# Patient Record
Sex: Male | Born: 1950 | Race: Black or African American | Hispanic: No | Marital: Married | Smoking: Former smoker
Health system: Southern US, Community
[De-identification: ages and names within clinical notes are randomized; demographics above are authoritative.]

## PROBLEM LIST (undated history)

## (undated) DIAGNOSIS — E785 Hyperlipidemia, unspecified: Secondary | ICD-10-CM

## (undated) DIAGNOSIS — M1990 Unspecified osteoarthritis, unspecified site: Secondary | ICD-10-CM

## (undated) DIAGNOSIS — D649 Anemia, unspecified: Secondary | ICD-10-CM

## (undated) DIAGNOSIS — H269 Unspecified cataract: Secondary | ICD-10-CM

## (undated) DIAGNOSIS — M5410 Radiculopathy, site unspecified: Secondary | ICD-10-CM

## (undated) DIAGNOSIS — E119 Type 2 diabetes mellitus without complications: Secondary | ICD-10-CM

## (undated) DIAGNOSIS — I10 Essential (primary) hypertension: Secondary | ICD-10-CM

## (undated) DIAGNOSIS — C8190 Hodgkin lymphoma, unspecified, unspecified site: Secondary | ICD-10-CM

## (undated) DIAGNOSIS — I639 Cerebral infarction, unspecified: Secondary | ICD-10-CM

## (undated) DIAGNOSIS — J189 Pneumonia, unspecified organism: Secondary | ICD-10-CM

## (undated) DIAGNOSIS — Z8719 Personal history of other diseases of the digestive system: Secondary | ICD-10-CM

## (undated) DIAGNOSIS — I519 Heart disease, unspecified: Secondary | ICD-10-CM

## (undated) DIAGNOSIS — R0981 Nasal congestion: Secondary | ICD-10-CM

## (undated) DIAGNOSIS — J449 Chronic obstructive pulmonary disease, unspecified: Secondary | ICD-10-CM

## (undated) DIAGNOSIS — G4730 Sleep apnea, unspecified: Secondary | ICD-10-CM

## (undated) HISTORY — DX: Essential (primary) hypertension: I10

## (undated) HISTORY — DX: Unspecified cataract: H26.9

## (undated) HISTORY — DX: Anemia, unspecified: D64.9

## (undated) HISTORY — DX: Hodgkin lymphoma, unspecified, unspecified site: C81.90

## (undated) HISTORY — DX: Heart disease, unspecified: I51.9

## (undated) HISTORY — DX: Pneumonia, unspecified organism: J18.9

## (undated) HISTORY — DX: Sleep apnea, unspecified: G47.30

## (undated) HISTORY — DX: Cerebral infarction, unspecified: I63.9

## (undated) HISTORY — DX: Hyperlipidemia, unspecified: E78.5

## (undated) HISTORY — DX: Radiculopathy, site unspecified: M54.10

## (undated) HISTORY — DX: Type 2 diabetes mellitus without complications: E11.9

## (undated) HISTORY — DX: Chronic obstructive pulmonary disease, unspecified: J44.9

---

## 1998-06-03 DIAGNOSIS — C8190 Hodgkin lymphoma, unspecified, unspecified site: Secondary | ICD-10-CM

## 1998-06-03 HISTORY — DX: Hodgkin lymphoma, unspecified, unspecified site: C81.90

## 1998-06-03 HISTORY — PX: OTHER SURGICAL HISTORY: SHX169

## 1998-10-11 ENCOUNTER — Other Ambulatory Visit: Admission: RE | Admit: 1998-10-11 | Discharge: 1998-10-11 | Payer: Self-pay | Admitting: Oncology

## 1998-11-06 ENCOUNTER — Ambulatory Visit (HOSPITAL_COMMUNITY): Admission: RE | Admit: 1998-11-06 | Discharge: 1998-11-06 | Payer: Self-pay | Admitting: Oncology

## 1998-11-06 ENCOUNTER — Encounter: Payer: Self-pay | Admitting: Oncology

## 1998-11-27 ENCOUNTER — Ambulatory Visit (HOSPITAL_COMMUNITY): Admission: RE | Admit: 1998-11-27 | Discharge: 1998-11-27 | Payer: Self-pay | Admitting: Oncology

## 1999-01-16 ENCOUNTER — Ambulatory Visit (HOSPITAL_COMMUNITY): Admission: RE | Admit: 1999-01-16 | Discharge: 1999-01-16 | Payer: Self-pay | Admitting: Oncology

## 1999-02-16 ENCOUNTER — Other Ambulatory Visit: Admission: RE | Admit: 1999-02-16 | Discharge: 1999-02-16 | Payer: Self-pay | Admitting: Oncology

## 2000-09-01 HISTORY — PX: OTHER SURGICAL HISTORY: SHX169

## 2000-09-04 ENCOUNTER — Encounter: Payer: Self-pay | Admitting: Internal Medicine

## 2000-11-18 ENCOUNTER — Encounter: Payer: Self-pay | Admitting: Internal Medicine

## 2000-12-18 ENCOUNTER — Encounter: Payer: Self-pay | Admitting: Internal Medicine

## 2001-06-03 HISTORY — PX: COLONOSCOPY: SHX174

## 2001-09-29 ENCOUNTER — Encounter: Payer: Self-pay | Admitting: Internal Medicine

## 2002-03-25 ENCOUNTER — Encounter: Payer: Self-pay | Admitting: Internal Medicine

## 2002-08-17 ENCOUNTER — Encounter: Payer: Self-pay | Admitting: Internal Medicine

## 2002-09-04 ENCOUNTER — Inpatient Hospital Stay (HOSPITAL_COMMUNITY): Admission: EM | Admit: 2002-09-04 | Discharge: 2002-09-08 | Payer: Self-pay | Admitting: Emergency Medicine

## 2002-09-04 ENCOUNTER — Encounter: Payer: Self-pay | Admitting: Emergency Medicine

## 2002-09-05 ENCOUNTER — Encounter: Payer: Self-pay | Admitting: Neurology

## 2002-09-06 ENCOUNTER — Encounter: Payer: Self-pay | Admitting: Cardiology

## 2002-09-07 ENCOUNTER — Encounter: Payer: Self-pay | Admitting: Internal Medicine

## 2002-09-13 ENCOUNTER — Encounter: Payer: Self-pay | Admitting: Internal Medicine

## 2002-09-13 ENCOUNTER — Ambulatory Visit (HOSPITAL_COMMUNITY): Admission: RE | Admit: 2002-09-13 | Discharge: 2002-09-13 | Payer: Self-pay | Admitting: Internal Medicine

## 2002-09-28 ENCOUNTER — Encounter: Payer: Self-pay | Admitting: Internal Medicine

## 2002-09-28 ENCOUNTER — Encounter: Admission: RE | Admit: 2002-09-28 | Discharge: 2002-09-28 | Payer: Self-pay | Admitting: Internal Medicine

## 2002-11-19 ENCOUNTER — Encounter: Payer: Self-pay | Admitting: Orthopedic Surgery

## 2002-11-19 ENCOUNTER — Ambulatory Visit (HOSPITAL_COMMUNITY): Admission: RE | Admit: 2002-11-19 | Discharge: 2002-11-19 | Payer: Self-pay | Admitting: Orthopedic Surgery

## 2002-12-29 ENCOUNTER — Encounter: Payer: Self-pay | Admitting: Orthopedic Surgery

## 2002-12-29 ENCOUNTER — Encounter: Admission: RE | Admit: 2002-12-29 | Discharge: 2002-12-29 | Payer: Self-pay | Admitting: Orthopedic Surgery

## 2003-06-04 DIAGNOSIS — I639 Cerebral infarction, unspecified: Secondary | ICD-10-CM

## 2003-06-04 HISTORY — DX: Cerebral infarction, unspecified: I63.9

## 2003-06-24 ENCOUNTER — Inpatient Hospital Stay (HOSPITAL_COMMUNITY): Admission: EM | Admit: 2003-06-24 | Discharge: 2003-06-29 | Payer: Self-pay | Admitting: Emergency Medicine

## 2003-06-29 ENCOUNTER — Encounter: Payer: Self-pay | Admitting: Internal Medicine

## 2004-06-21 ENCOUNTER — Ambulatory Visit: Payer: Self-pay | Admitting: Internal Medicine

## 2004-09-21 ENCOUNTER — Ambulatory Visit: Payer: Self-pay | Admitting: Oncology

## 2004-10-19 ENCOUNTER — Ambulatory Visit: Payer: Self-pay | Admitting: Internal Medicine

## 2005-01-16 ENCOUNTER — Ambulatory Visit: Payer: Self-pay | Admitting: Internal Medicine

## 2005-04-17 ENCOUNTER — Ambulatory Visit: Payer: Self-pay | Admitting: Internal Medicine

## 2005-07-11 ENCOUNTER — Ambulatory Visit: Payer: Self-pay | Admitting: Internal Medicine

## 2005-07-18 ENCOUNTER — Ambulatory Visit: Payer: Self-pay | Admitting: Internal Medicine

## 2005-09-20 ENCOUNTER — Ambulatory Visit: Payer: Self-pay | Admitting: Oncology

## 2005-09-23 LAB — CBC WITH DIFFERENTIAL/PLATELET
BASO%: 0.6 % (ref 0.0–2.0)
EOS%: 2.6 % (ref 0.0–7.0)
HGB: 14.1 g/dL (ref 13.0–17.1)
LYMPH%: 23.7 % (ref 14.0–48.0)
MCH: 33.2 pg (ref 28.0–33.4)
MCHC: 33.8 g/dL (ref 32.0–35.9)
MONO%: 8.3 % (ref 0.0–13.0)
NEUT#: 2.5 10*3/uL (ref 1.5–6.5)
NEUT%: 64.8 % (ref 40.0–75.0)
WBC: 3.8 10*3/uL — ABNORMAL LOW (ref 4.0–10.0)

## 2005-10-08 ENCOUNTER — Ambulatory Visit: Payer: Self-pay | Admitting: Internal Medicine

## 2005-10-15 ENCOUNTER — Ambulatory Visit: Payer: Self-pay | Admitting: Internal Medicine

## 2006-01-16 ENCOUNTER — Ambulatory Visit: Payer: Self-pay | Admitting: Internal Medicine

## 2006-04-17 ENCOUNTER — Ambulatory Visit: Payer: Self-pay | Admitting: Internal Medicine

## 2006-05-01 ENCOUNTER — Ambulatory Visit: Payer: Self-pay | Admitting: Internal Medicine

## 2006-05-01 LAB — CONVERTED CEMR LAB
Alkaline Phosphatase: 78 units/L (ref 39–117)
BUN: 11 mg/dL (ref 6–23)
Calcium: 9.6 mg/dL (ref 8.4–10.5)
Chloride: 101 meq/L (ref 96–112)
GFR calc non Af Amer: 66 mL/min
Glomerular Filtration Rate, Af Am: 80 mL/min/{1.73_m2}
Hemoglobin: 14.7 g/dL (ref 13.0–17.0)
Monocytes Absolute: 0.5 10*3/uL (ref 0.2–0.7)
Monocytes Relative: 12.1 % — ABNORMAL HIGH (ref 3.0–11.0)
Neutro Abs: 2.2 10*3/uL (ref 1.4–7.7)
RBC: 4.49 M/uL (ref 4.22–5.81)
RDW: 11.9 % (ref 11.5–14.6)
WBC: 4.3 10*3/uL — ABNORMAL LOW (ref 4.5–10.5)

## 2006-07-04 DIAGNOSIS — J189 Pneumonia, unspecified organism: Secondary | ICD-10-CM

## 2006-07-04 HISTORY — DX: Pneumonia, unspecified organism: J18.9

## 2006-07-15 ENCOUNTER — Ambulatory Visit: Payer: Self-pay | Admitting: Internal Medicine

## 2006-07-15 ENCOUNTER — Inpatient Hospital Stay (HOSPITAL_COMMUNITY): Admission: EM | Admit: 2006-07-15 | Discharge: 2006-07-26 | Payer: Self-pay | Admitting: Emergency Medicine

## 2006-08-04 ENCOUNTER — Ambulatory Visit: Payer: Self-pay | Admitting: Internal Medicine

## 2006-09-17 ENCOUNTER — Ambulatory Visit: Payer: Self-pay | Admitting: Oncology

## 2006-09-17 ENCOUNTER — Ambulatory Visit: Payer: Self-pay | Admitting: Internal Medicine

## 2006-09-22 LAB — CBC WITH DIFFERENTIAL/PLATELET
Basophils Absolute: 0 10*3/uL (ref 0.0–0.1)
EOS%: 7 % (ref 0.0–7.0)
MCH: 31.5 pg (ref 28.0–33.4)
MCV: 90.7 fL (ref 81.6–98.0)
MONO%: 11.3 % (ref 0.0–13.0)
RBC: 4.49 10*6/uL (ref 4.20–5.71)
RDW: 13 % (ref 11.2–14.6)

## 2006-09-22 LAB — ERYTHROCYTE SEDIMENTATION RATE: Sed Rate: 5 mm/hr (ref 0–20)

## 2006-10-13 LAB — CBC WITH DIFFERENTIAL/PLATELET
BASO%: 0.5 % (ref 0.0–2.0)
Basophils Absolute: 0 10*3/uL (ref 0.0–0.1)
EOS%: 3.4 % (ref 0.0–7.0)
HCT: 39 % (ref 38.7–49.9)
HGB: 13.5 g/dL (ref 13.0–17.1)
MCH: 31.2 pg (ref 28.0–33.4)
MCHC: 34.5 g/dL (ref 32.0–35.9)
MCV: 90.4 fL (ref 81.6–98.0)
MONO%: 11.9 % (ref 0.0–13.0)
NEUT%: 59.2 % (ref 40.0–75.0)
RDW: 12.8 % (ref 11.2–14.6)
lymph#: 1.2 10*3/uL (ref 0.9–3.3)

## 2006-11-04 ENCOUNTER — Ambulatory Visit: Payer: Self-pay | Admitting: Internal Medicine

## 2006-11-04 LAB — CONVERTED CEMR LAB: Hgb A1c MFr Bld: 7.7 % — ABNORMAL HIGH (ref 4.6–6.0)

## 2006-11-05 ENCOUNTER — Ambulatory Visit: Payer: Self-pay | Admitting: Oncology

## 2006-11-05 ENCOUNTER — Encounter: Payer: Self-pay | Admitting: Internal Medicine

## 2006-11-06 ENCOUNTER — Encounter: Payer: Self-pay | Admitting: Internal Medicine

## 2006-11-06 DIAGNOSIS — I10 Essential (primary) hypertension: Secondary | ICD-10-CM | POA: Insufficient documentation

## 2006-11-06 DIAGNOSIS — Z8679 Personal history of other diseases of the circulatory system: Secondary | ICD-10-CM | POA: Insufficient documentation

## 2006-11-06 DIAGNOSIS — I509 Heart failure, unspecified: Secondary | ICD-10-CM

## 2006-11-06 DIAGNOSIS — E785 Hyperlipidemia, unspecified: Secondary | ICD-10-CM | POA: Insufficient documentation

## 2006-11-06 DIAGNOSIS — C8190 Hodgkin lymphoma, unspecified, unspecified site: Secondary | ICD-10-CM | POA: Insufficient documentation

## 2006-11-06 DIAGNOSIS — E1159 Type 2 diabetes mellitus with other circulatory complications: Secondary | ICD-10-CM | POA: Insufficient documentation

## 2007-01-19 ENCOUNTER — Encounter (INDEPENDENT_AMBULATORY_CARE_PROVIDER_SITE_OTHER): Payer: Self-pay

## 2007-01-30 ENCOUNTER — Ambulatory Visit: Payer: Self-pay | Admitting: Oncology

## 2007-02-10 ENCOUNTER — Ambulatory Visit: Payer: Self-pay | Admitting: Internal Medicine

## 2007-02-10 LAB — CONVERTED CEMR LAB: Hgb A1c MFr Bld: 8 % — ABNORMAL HIGH (ref 4.6–6.0)

## 2007-02-18 ENCOUNTER — Telehealth: Payer: Self-pay | Admitting: Internal Medicine

## 2007-02-24 ENCOUNTER — Ambulatory Visit: Payer: Self-pay | Admitting: Internal Medicine

## 2007-02-24 DIAGNOSIS — J069 Acute upper respiratory infection, unspecified: Secondary | ICD-10-CM

## 2007-03-11 ENCOUNTER — Telehealth: Payer: Self-pay | Admitting: Internal Medicine

## 2007-03-16 ENCOUNTER — Ambulatory Visit: Payer: Self-pay | Admitting: Oncology

## 2007-05-12 ENCOUNTER — Encounter: Payer: Self-pay | Admitting: Internal Medicine

## 2007-05-12 ENCOUNTER — Ambulatory Visit: Payer: Self-pay | Admitting: Oncology

## 2007-05-14 ENCOUNTER — Ambulatory Visit: Payer: Self-pay | Admitting: Internal Medicine

## 2007-05-18 LAB — CONVERTED CEMR LAB: Hgb A1c MFr Bld: 8.1 % — ABNORMAL HIGH (ref 4.6–6.0)

## 2007-05-25 ENCOUNTER — Telehealth: Payer: Self-pay | Admitting: Internal Medicine

## 2007-06-22 ENCOUNTER — Ambulatory Visit: Payer: Self-pay | Admitting: Internal Medicine

## 2007-07-17 ENCOUNTER — Ambulatory Visit: Payer: Self-pay | Admitting: Oncology

## 2007-07-21 ENCOUNTER — Encounter: Payer: Self-pay | Admitting: Internal Medicine

## 2007-09-17 ENCOUNTER — Encounter: Payer: Self-pay | Admitting: Internal Medicine

## 2007-09-18 ENCOUNTER — Ambulatory Visit: Payer: Self-pay | Admitting: Internal Medicine

## 2007-09-21 LAB — CONVERTED CEMR LAB: Hgb A1c MFr Bld: 8.3 % — ABNORMAL HIGH (ref 4.6–6.0)

## 2007-10-20 ENCOUNTER — Telehealth: Payer: Self-pay | Admitting: Internal Medicine

## 2007-12-14 ENCOUNTER — Encounter: Payer: Self-pay | Admitting: Internal Medicine

## 2007-12-18 ENCOUNTER — Ambulatory Visit: Payer: Self-pay | Admitting: Internal Medicine

## 2007-12-21 LAB — CONVERTED CEMR LAB: Hgb A1c MFr Bld: 8.4 % — ABNORMAL HIGH (ref 4.6–6.0)

## 2008-01-14 ENCOUNTER — Ambulatory Visit: Payer: Self-pay | Admitting: Oncology

## 2008-02-19 ENCOUNTER — Encounter: Payer: Self-pay | Admitting: Internal Medicine

## 2008-03-18 ENCOUNTER — Ambulatory Visit: Payer: Self-pay | Admitting: Internal Medicine

## 2008-03-21 ENCOUNTER — Telehealth (INDEPENDENT_AMBULATORY_CARE_PROVIDER_SITE_OTHER): Payer: Self-pay

## 2008-04-13 ENCOUNTER — Telehealth: Payer: Self-pay | Admitting: Internal Medicine

## 2008-06-14 ENCOUNTER — Ambulatory Visit: Payer: Self-pay | Admitting: Internal Medicine

## 2008-06-14 LAB — CONVERTED CEMR LAB
Alkaline Phosphatase: 64 units/L (ref 39–117)
BUN: 13 mg/dL (ref 6–23)
Bilirubin Urine: NEGATIVE
Bilirubin, Direct: 0.1 mg/dL (ref 0.0–0.3)
Blood in Urine, dipstick: NEGATIVE
Chloride: 108 meq/L (ref 96–112)
Cholesterol: 132 mg/dL (ref 0–200)
Creatinine, Ser: 0.9 mg/dL (ref 0.4–1.5)
Creatinine,U: 74.3 mg/dL
Eosinophils Relative: 2 % (ref 0.0–5.0)
Glucose, Bld: 179 mg/dL — ABNORMAL HIGH (ref 70–99)
HDL: 40.4 mg/dL (ref >39.0)
Hgb A1c MFr Bld: 9 % — ABNORMAL HIGH (ref 4.6–6.0)
MCHC: 33.3 g/dL (ref 30.0–36.0)
Microalb Creat Ratio: 296.1 mg/g — ABNORMAL HIGH (ref 0.0–30.0)
RDW: 11.9 % (ref 11.5–14.6)
Sodium: 146 meq/L — ABNORMAL HIGH (ref 135–145)
Specific Gravity, Urine: 1.015
TSH: 1.23 microintl units/mL (ref 0.35–5.50)
Total Protein: 6.7 g/dL (ref 6.0–8.3)
Urobilinogen, UA: 1
VLDL: 19 mg/dL (ref 0–40)
WBC Urine, dipstick: NEGATIVE
pH: 5.5

## 2008-06-21 ENCOUNTER — Ambulatory Visit: Payer: Self-pay | Admitting: Internal Medicine

## 2008-07-22 ENCOUNTER — Ambulatory Visit: Payer: Self-pay | Admitting: Internal Medicine

## 2008-10-14 ENCOUNTER — Ambulatory Visit: Payer: Self-pay | Admitting: Oncology

## 2008-10-18 ENCOUNTER — Encounter: Payer: Self-pay | Admitting: Internal Medicine

## 2008-10-20 ENCOUNTER — Ambulatory Visit: Payer: Self-pay | Admitting: Internal Medicine

## 2008-10-21 LAB — CONVERTED CEMR LAB: Hgb A1c MFr Bld: 7.5 % — ABNORMAL HIGH (ref 4.6–6.5)

## 2008-12-22 ENCOUNTER — Telehealth: Payer: Self-pay | Admitting: Internal Medicine

## 2009-01-20 ENCOUNTER — Ambulatory Visit: Payer: Self-pay | Admitting: Internal Medicine

## 2009-02-02 ENCOUNTER — Telehealth: Payer: Self-pay | Admitting: Internal Medicine

## 2009-04-21 ENCOUNTER — Ambulatory Visit: Payer: Self-pay | Admitting: Internal Medicine

## 2009-04-21 LAB — CONVERTED CEMR LAB: Hgb A1c MFr Bld: 7.3 % — ABNORMAL HIGH (ref 4.6–6.5)

## 2009-05-01 ENCOUNTER — Telehealth: Payer: Self-pay | Admitting: Internal Medicine

## 2009-06-16 ENCOUNTER — Ambulatory Visit: Payer: Self-pay | Admitting: Oncology

## 2009-06-20 ENCOUNTER — Encounter: Payer: Self-pay | Admitting: Internal Medicine

## 2009-07-20 ENCOUNTER — Ambulatory Visit: Payer: Self-pay | Admitting: Internal Medicine

## 2009-07-20 LAB — CONVERTED CEMR LAB: Hgb A1c MFr Bld: 7.4 % — ABNORMAL HIGH (ref 4.6–6.5)

## 2009-07-27 ENCOUNTER — Telehealth: Payer: Self-pay | Admitting: Internal Medicine

## 2009-09-21 ENCOUNTER — Telehealth: Payer: Self-pay | Admitting: Internal Medicine

## 2009-10-18 ENCOUNTER — Ambulatory Visit: Payer: Self-pay | Admitting: Internal Medicine

## 2009-10-18 LAB — CONVERTED CEMR LAB
Albumin: 3.8 g/dL (ref 3.5–5.2)
Bilirubin Urine: NEGATIVE
Bilirubin, Direct: 0.3 mg/dL (ref 0.0–0.3)
CO2: 30 meq/L (ref 19–32)
Calcium: 9.2 mg/dL (ref 8.4–10.5)
Chloride: 103 meq/L (ref 96–112)
Eosinophils Absolute: 0.2 10*3/uL (ref 0.0–0.7)
Glucose, Bld: 84 mg/dL (ref 70–99)
HDL: 30.3 mg/dL — ABNORMAL LOW (ref >39.00)
Hemoglobin: 14.2 g/dL (ref 13.0–17.0)
Hgb A1c MFr Bld: 7.3 % — ABNORMAL HIGH (ref 4.6–6.5)
Ketones, urine, test strip: NEGATIVE
LDL Cholesterol: 54 mg/dL (ref 0–99)
Lymphocytes Relative: 35.2 % (ref 12.0–46.0)
MCV: 97.2 fL (ref 78.0–100.0)
Microalb Creat Ratio: 50.4 mg/g — ABNORMAL HIGH (ref 0.0–30.0)
Microalb, Ur: 47.5 mg/dL — ABNORMAL HIGH (ref 0.0–1.9)
Monocytes Absolute: 0.6 10*3/uL (ref 0.1–1.0)
Nitrite: NEGATIVE
Potassium: 4.1 meq/L (ref 3.5–5.1)
RBC: 4.35 M/uL (ref 4.22–5.81)
Triglycerides: 83 mg/dL (ref 0.0–149.0)
Urobilinogen, UA: 0.2
VLDL: 16.6 mg/dL (ref 0.0–40.0)
WBC Urine, dipstick: NEGATIVE

## 2009-10-24 ENCOUNTER — Ambulatory Visit: Payer: Self-pay | Admitting: Internal Medicine

## 2009-10-24 LAB — HM DIABETES EYE EXAM

## 2010-01-23 ENCOUNTER — Ambulatory Visit: Payer: Self-pay | Admitting: Internal Medicine

## 2010-04-17 ENCOUNTER — Ambulatory Visit: Payer: Self-pay | Admitting: Internal Medicine

## 2010-04-18 ENCOUNTER — Telehealth: Payer: Self-pay | Admitting: Internal Medicine

## 2010-05-21 ENCOUNTER — Telehealth: Payer: Self-pay | Admitting: Internal Medicine

## 2010-06-15 ENCOUNTER — Ambulatory Visit: Payer: Self-pay | Admitting: Oncology

## 2010-06-19 ENCOUNTER — Encounter: Payer: Self-pay | Admitting: Internal Medicine

## 2010-06-19 LAB — CBC & DIFF AND RETIC
BASO%: 0.4 % (ref 0.0–2.0)
Basophils Absolute: 0 10*3/uL (ref 0.0–0.1)
EOS%: 2.1 % (ref 0.0–7.0)
Eosinophils Absolute: 0.1 10*3/uL (ref 0.0–0.5)
HCT: 42.1 % (ref 38.4–49.9)
HGB: 14.5 g/dL (ref 13.0–17.1)
Immature Retic Fract: 7 % (ref 0.00–13.40)
LYMPH%: 22.9 % (ref 14.0–49.0)
MCH: 32.3 pg (ref 27.2–33.4)
MCHC: 34.4 g/dL (ref 32.0–36.0)
MCV: 93.8 fL (ref 79.3–98.0)
MONO#: 0.6 10*3/uL (ref 0.1–0.9)
MONO%: 10.4 % (ref 0.0–14.0)
NEUT#: 3.4 10*3/uL (ref 1.5–6.5)
NEUT%: 64.2 % (ref 39.0–75.0)
Platelets: 303 10*3/uL (ref 140–400)
RBC: 4.49 10*6/uL (ref 4.20–5.82)
RDW: 12 % (ref 11.0–14.6)
Retic %: 1.76 % — ABNORMAL HIGH (ref 0.50–1.60)
Retic Ct Abs: 79.02 10*3/uL — ABNORMAL HIGH (ref 24.10–77.50)
WBC: 5.4 10*3/uL (ref 4.0–10.3)
lymph#: 1.2 10*3/uL (ref 0.9–3.3)
nRBC: 0 % (ref 0–0)

## 2010-06-19 LAB — CHCC SMEAR

## 2010-06-20 LAB — DIRECT ANTIGLOBULIN TEST (NOT AT ARMC)
DAT (Complement): NEGATIVE
DAT IgG: NEGATIVE

## 2010-07-03 NOTE — Progress Notes (Signed)
Summary: A1c results  Phone Note Call from Patient   Caller: Patient Call For: Gordy Savers  MD Summary of Call: returned call to pt - wanting A1C results - informed of results - maintain medications , exercise , watch diet , ROV 3mos . Verbalized understanding Initial call taken by: Duard Brady LPN,  July 27, 2009 11:41 AM

## 2010-07-03 NOTE — Assessment & Plan Note (Signed)
Summary: 3 MNTH ROV//SLM   Vital Signs:  Patient profile:   60 year old male Weight:      174 pounds Temp:     98.8 degrees F oral BP sitting:   124 / 90  (right arm) Cuff size:   regular  Vitals Entered By: Duard Brady LPN (July 20, 2009 11:25 AM) CC: 3 mos ROV - doing well  Is Patient Diabetic? Yes   CC:  3 mos ROV - doing well .  History of Present Illness: 60 year old patient who is seen today for follow-up of his  type 2 diabetes.  His blood sugars have been reasonably well.  His last hemoglobin A1c was 7.3.  Old when her there has been some modest weight loss.  He has treated hypertension, history of congestive heart failure, and cerebrovascular disease.  He denies any cardiac or focal neurological symptoms.  He has treated dyslipidemia, and a remote history of Hodgkin's lymphoma.  Preventive Screening-Counseling & Management  Alcohol-Tobacco     Smoking Status: quit  Allergies: 1)  Codeine Phosphate (Codeine Phosphate)  Family History: Reviewed history from 02/10/2007 and no changes required. father died 73, congestive heart failure mother died age 44, diabetes, coronary disease and some brothers 7 sisters positive for colonic polyps.  Lung cancer  Social History: Reviewed history from 06/21/2008 and no changes required. Married presently his exercise  regularly at  local [employer]   Review of Systems  The patient denies anorexia, fever, weight loss, weight gain, vision loss, decreased hearing, hoarseness, chest pain, syncope, dyspnea on exertion, peripheral edema, prolonged cough, headaches, hemoptysis, abdominal pain, melena, hematochezia, severe indigestion/heartburn, hematuria, incontinence, genital sores, muscle weakness, suspicious skin lesions, transient blindness, difficulty walking, depression, unusual weight change, abnormal bleeding, enlarged lymph nodes, angioedema, breast masses, and testicular masses.    Physical Exam  General:   overweight-appearing.  normal blood pressure Head:  Normocephalic and atraumatic without obvious abnormalities. No apparent alopecia or balding. Eyes:  No corneal or conjunctival inflammation noted. EOMI. Perrla. Funduscopic exam benign, without hemorrhages, exudates or papilledema. Vision grossly normal. Ears:  External ear exam shows no significant lesions or deformities.  Otoscopic examination reveals clear canals, tympanic membranes are intact bilaterally without bulging, retraction, inflammation or discharge. Hearing is grossly normal bilaterally. Mouth:  Oral mucosa and oropharynx without lesions or exudates.  Teeth in good repair. Neck:  No deformities, masses, or tenderness noted. Lungs:  bibasilar rales Heart:  Normal rate and regular rhythm. S1 and S2 normal without gallop, murmur, click, rub or other extra sounds. Abdomen:  Bowel sounds positive,abdomen soft and non-tender without masses, organomegaly or hernias noted.  asymptomatic ventral hernia Msk:  No deformity or scoliosis noted of thoracic or lumbar spine.   Pulses:  R and L carotid,radial,femoral,dorsalis pedis and posterior tibial pulses are full and equal bilaterally Extremities:  No clubbing, cyanosis, edema, or deformity noted with normal full range of motion of all joints.   Neurologic:  alert & oriented X3, cranial nerves II-XII intact, gait normal, and DTRs symmetrical and normal.   Skin:  Intact without suspicious lesions or rashes Cervical Nodes:  No lymphadenopathy noted Axillary Nodes:  No palpable lymphadenopathy   Impression & Recommendations:  Problem # 1:  DIABETES MELLITUS, TYPE II (ICD-250.00)  His updated medication list for this problem includes:    Aspirin Buffered 325 Mg Tabs (Aspirin buff(mgcarb-alaminoac)) ..... Once daily    Fosinopril Sodium 20 Mg Tabs (Fosinopril sodium) .Marland Kitchen... 1 two times a day    Glimepiride  4 Mg Tabs (Glimepiride) .Marland Kitchen... 1 two times a day    Metformin Hcl 1000 Mg Tabs  (Metformin hcl) .Marland Kitchen... 1 two times a day    Byetta 10 Mcg Pen 10 Mcg/0.24ml Soln (Exenatide) ..... Use  twice daily    His updated medication list for this problem includes:    Aspirin Buffered 325 Mg Tabs (Aspirin buff(mgcarb-alaminoac)) ..... Once daily    Fosinopril Sodium 20 Mg Tabs (Fosinopril sodium) .Marland Kitchen... 1 two times a day    Glimepiride 4 Mg Tabs (Glimepiride) .Marland Kitchen... 1 two times a day    Metformin Hcl 1000 Mg Tabs (Metformin hcl) .Marland Kitchen... 1 two times a day    Byetta 10 Mcg Pen 10 Mcg/0.27ml Soln (Exenatide) ..... Use  twice daily  Orders: Venipuncture (16109) TLB-A1C / Hgb A1C (Glycohemoglobin) (83036-A1C)  Problem # 2:  CONGESTIVE HEART FAILURE (ICD-428.0)  His updated medication list for this problem includes:    Aspirin Buffered 325 Mg Tabs (Aspirin buff(mgcarb-alaminoac)) ..... Once daily    Fosinopril Sodium 20 Mg Tabs (Fosinopril sodium) .Marland Kitchen... 1 two times a day    Metoprolol Tartrate 50 Mg Tabs (Metoprolol tartrate) .Marland Kitchen... 1 once daily  His updated medication list for this problem includes:    Aspirin Buffered 325 Mg Tabs (Aspirin buff(mgcarb-alaminoac)) ..... Once daily    Fosinopril Sodium 20 Mg Tabs (Fosinopril sodium) .Marland Kitchen... 1 two times a day    Metoprolol Tartrate 50 Mg Tabs (Metoprolol tartrate) .Marland Kitchen... 1 once daily  Problem # 3:  HYPERTENSION (ICD-401.9)  His updated medication list for this problem includes:    Caduet 10-10 Mg Tabs (Amlodipine-atorvastatin) .Marland Kitchen... 1 once daily    Fosinopril Sodium 20 Mg Tabs (Fosinopril sodium) .Marland Kitchen... 1 two times a day    Metoprolol Tartrate 50 Mg Tabs (Metoprolol tartrate) .Marland Kitchen... 1 once daily    Terazosin Hcl 5 Mg Caps (Terazosin hcl) .Marland Kitchen... 1 once daily  His updated medication list for this problem includes:    Caduet 10-10 Mg Tabs (Amlodipine-atorvastatin) .Marland Kitchen... 1 once daily    Fosinopril Sodium 20 Mg Tabs (Fosinopril sodium) .Marland Kitchen... 1 two times a day    Metoprolol Tartrate 50 Mg Tabs (Metoprolol tartrate) .Marland Kitchen... 1 once daily     Terazosin Hcl 5 Mg Caps (Terazosin hcl) .Marland Kitchen... 1 once daily  Complete Medication List: 1)  Accu-chek Comfort Curve Strp (Glucose blood) 2)  Aspirin Buffered 325 Mg Tabs (Aspirin buff(mgcarb-alaminoac)) .... Once daily 3)  Caduet 10-10 Mg Tabs (Amlodipine-atorvastatin) .Marland Kitchen.. 1 once daily 4)  Fosinopril Sodium 20 Mg Tabs (Fosinopril sodium) .Marland Kitchen.. 1 two times a day 5)  Glimepiride 4 Mg Tabs (Glimepiride) .Marland Kitchen.. 1 two times a day 6)  Klor-con M20 20 Meq Tbcr (Potassium chloride crys cr) .Marland Kitchen.. 1 once daily 7)  Metformin Hcl 1000 Mg Tabs (Metformin hcl) .Marland Kitchen.. 1 two times a day 8)  Metoprolol Tartrate 50 Mg Tabs (Metoprolol tartrate) .Marland Kitchen.. 1 once daily 9)  Terazosin Hcl 5 Mg Caps (Terazosin hcl) .Marland Kitchen.. 1 once daily 10)  Centrum Silver Tabs (Multiple vitamins-minerals) .... Once daily 11)  Lorazepam 0.5 Mg Tabs (Lorazepam) .Marland Kitchen.. 1 two times a day as needed 12)  Allegra-d 12 Hour 60-120 Mg Tb12 (Fexofenadine-pseudoephedrine) .... One twice daily 13)  Accu-chek Softclix Lancets Misc (Lancets) .... Uad 14)  Temazepam 15 Mg Caps (Temazepam) .Marland Kitchen.. 1 at bedtime 15)  Byetta 10 Mcg Pen 10 Mcg/0.56ml Soln (Exenatide) .... Use  twice daily 16)  Accu-chek Aviva Strp (Glucose blood) .... Use daily  Patient Instructions: 1)  Please schedule a  follow-up appointment in 3 months. 2)  Limit your Sodium (Salt). 3)  It is important that you exercise regularly at least 20 minutes 5 times a week. If you develop chest pain, have severe difficulty breathing, or feel very tired , stop exercising immediately and seek medical attention. 4)  You need to lose weight. Consider a lower calorie diet and regular exercise.  5)  Check your blood sugars regularly. If your readings are usually above : or below 70 you should contact our office. 6)  It is important that your Diabetic A1c level is checked every 3 months. 7)  See your eye doctor yearly to check for diabetic eye damage.

## 2010-07-03 NOTE — Progress Notes (Signed)
  Phone Note Call from Patient Call back at Home Phone [phone]   Caller: Patient Call For: Gordy Savers  MD Summary of Call: Pt is calling for results of labs yesterday.  Initial call taken by: [facility] CMA AAMA,  April 18, 2010 4:14 PM  Follow-up for Phone Call        HghA1C 7.2 -almost at goal- stress exercise and weight loss Follow-up by: Gordy Savers  MD,  April 19, 2010 8:26 AM  Additional Follow-up for Phone Call Additional follow up Details #1::        Notified pt. Additional Follow-up by: Lynann Beaver CMA AAMA,  April 19, 2010 8:55 AM

## 2010-07-03 NOTE — Assessment & Plan Note (Signed)
Summary: 3 MONTH ROV/NJR   Vital Signs:  Patient profile:   60 year old male Weight:      168 pounds Temp:     98.1 degrees F oral BP sitting:   128 / 70  (right arm) Cuff size:   regular  Vitals Entered By: Duard Brady LPN (April 17, 2010 11:03 AM) CC: 3 mos rov - doing well    fbs 121 Is Patient Diabetic? Yes Did you bring your meter with you today? No  Flu Vaccine Consent Questions     Do you have a history of severe allergic reactions to this vaccine? no    Any prior history of allergic reactions to egg and/or gelatin? no    Do you have a sensitivity to the preservative Thimersol? no    Do you have a past history of Guillan-Barre Syndrome? no    Do you currently have an acute febrile illness? no    Have you ever had a severe reaction to latex? no    Vaccine information given and explained to patient? yes    Are you currently pregnant? no    Lot Number:[id number]   Exp Date:12/01/2010   Site Given  Left Deltoid IM  CC:  3 mos rov - doing well    fbs 121.  History of Present Illness: 60 year old patient who is seen today for follow-up of his type 2 diabetes.  He has hypertension and a history congestive heart failure.  He has cerebrovascular disease, which has been stable.  He denies any focal neurological symptoms.  He denies any symptoms of congestive heart failure.  He has remote history of Hodgkin's lymphoma, which has been stable for a number of years. Since his last visit here.  He has lost 4 pounds in weight.  His last hemoglobin A1c's have been slightly greater than 7.  Allergies: 1)  Codeine Phosphate (Codeine Phosphate)  Past History:  Past Medical History: Reviewed history from 12/18/2007 and no changes required. Hodgkin's lymphoma stage IV B. 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 lymph node biopsy heart catheterization June 2002  colonoscopy 2003 2-D echocardiogram-April 2002 ejection fraction 55 to 65%  Review of Systems  The patient denies anorexia, fever, weight loss, weight gain, vision loss, decreased hearing, hoarseness, chest pain, syncope, dyspnea on exertion, peripheral edema, prolonged cough, headaches, hemoptysis, abdominal pain, melena, hematochezia, severe indigestion/heartburn, hematuria, incontinence, genital sores, muscle weakness, suspicious skin lesions, transient blindness, difficulty walking, depression, unusual weight change, abnormal bleeding, enlarged lymph nodes, angioedema, breast masses, and testicular masses.    Physical Exam  General:  Well-developed,well-nourished,in no acute distress; alert,appropriate and cooperative throughout examination Head:  Normocephalic and atraumatic without obvious abnormalities. No apparent alopecia or balding. Eyes:  No corneal or conjunctival inflammation noted. EOMI. Perrla. Funduscopic exam benign, without hemorrhages, exudates or papilledema. Vision grossly normal. Mouth:  Oral mucosa and oropharynx without lesions or exudates.  Teeth in good repair. Neck:  No deformities, masses, or tenderness noted. Lungs:  bibasilar rales Heart:  Normal rate and regular rhythm. S1 and S2 normal without gallop, murmur, click, rub or other extra sounds. Abdomen:  Bowel sounds positive,abdomen soft and non-tender without masses, organomegaly or hernias noted. Msk:  No deformity or scoliosis noted of thoracic or lumbar spine.  Pulses:  pedal pulses, full Extremities:  no edema  Diabetes Management Exam:    Foot Exam (with socks and/or shoes not present):       Sensory-Pinprick/Light touch:          Left medial foot (L-4): normal          Left  dorsal foot (L-5): normal          Left lateral foot (S-1): normal          Right medial foot (L-4): normal          Right dorsal foot (L-5): normal          Right lateral foot (S-1): normal       Sensory-Monofilament:          Left foot: normal          Right foot: normal       Inspection:          Left foot: normal          Right foot: normal       Nails:          Left foot: normal          Right foot: normal    Foot Exam by Podiatrist:       Date: 04/17/2010       Results: no diabetic findings       Done by: PCP   Impression & Recommendations:  Problem # 1:  HYPERTENSION (ICD-401.9)  His updated medication list for this problem includes:    Caduet 10-10 Mg Tabs (Amlodipine-atorvastatin) .Marland Kitchen... 1 once daily    Fosinopril Sodium 20 Mg Tabs (Fosinopril sodium) .Marland Kitchen... 1 two times a day    Metoprolol Tartrate 50 Mg Tabs (Metoprolol tartrate) .Marland Kitchen... 1 once daily    Terazosin Hcl 5 Mg Caps (Terazosin hcl) .Marland Kitchen... 1 once daily  His updated medication list for this problem includes:    Caduet 10-10 Mg Tabs (Amlodipine-atorvastatin) .Marland Kitchen... 1 once daily    Fosinopril Sodium 20 Mg Tabs (Fosinopril sodium) .Marland Kitchen... 1 two times a day    Metoprolol Tartrate 50 Mg Tabs (Metoprolol tartrate) .Marland Kitchen... 1 once daily    Terazosin Hcl 5 Mg Caps (Terazosin hcl) .Marland Kitchen... 1 once daily  Problem # 2:  CEREBROVASCULAR ACCIDENT, HX OF (ICD-V12.50)  Problem # 3:  DIABETES MELLITUS, TYPE II (ICD-250.00)  His updated medication list for this problem includes:    Aspirin Buffered 325 Mg Tabs (Aspirin buff(mgcarb-alaminoac)) ..... Once daily    Fosinopril Sodium 20 Mg Tabs (Fosinopril sodium) .Marland Kitchen... 1 two times a day    Glimepiride 4 Mg Tabs (Glimepiride) .Marland Kitchen... 1 two times a day    Metformin Hcl 1000 Mg Tabs (Metformin hcl) .Marland Kitchen... 1 two times a day    Byetta 10 Mcg Pen 10 Mcg/0.41ml Soln (Exenatide) ..... Use  twice daily    His updated medication list for this problem includes:    Aspirin Buffered 325 Mg Tabs  (Aspirin buff(mgcarb-alaminoac)) ..... Once daily    Fosinopril Sodium 20 Mg Tabs (Fosinopril sodium) .Marland Kitchen... 1 two times a day    Glimepiride 4 Mg Tabs (Glimepiride) .Marland Kitchen... 1 two times a day    Metformin Hcl 1000 Mg Tabs (Metformin hcl) .Marland Kitchen... 1 two times a day    Byetta 10 Mcg Pen 10 Mcg/0.54ml Soln (Exenatide) ..... Use  twice daily  Orders: Venipuncture (16109) TLB-A1C / Hgb A1C (Glycohemoglobin) (83036-A1C) Specimen Handling (60454)  Problem # 4:  HYPERLIPIDEMIA (ICD-272.4)  His updated  medication list for this problem includes:    Caduet 10-10 Mg Tabs (Amlodipine-atorvastatin) .Marland Kitchen... 1 once daily  His updated medication list for this problem includes:    Caduet 10-10 Mg Tabs (Amlodipine-atorvastatin) .Marland Kitchen... 1 once daily  Complete Medication List: 1)  Accu-chek Comfort Curve Strp (Glucose blood) 2)  Aspirin Buffered 325 Mg Tabs (Aspirin buff(mgcarb-alaminoac)) .... Once daily 3)  Caduet 10-10 Mg Tabs (Amlodipine-atorvastatin) .Marland Kitchen.. 1 once daily 4)  Fosinopril Sodium 20 Mg Tabs (Fosinopril sodium) .Marland Kitchen.. 1 two times a day 5)  Glimepiride 4 Mg Tabs (Glimepiride) .Marland Kitchen.. 1 two times a day 6)  Klor-con M20 20 Meq Tbcr (Potassium chloride crys cr) .Marland Kitchen.. 1 once daily 7)  Metformin Hcl 1000 Mg Tabs (Metformin hcl) .Marland Kitchen.. 1 two times a day 8)  Metoprolol Tartrate 50 Mg Tabs (Metoprolol tartrate) .Marland Kitchen.. 1 once daily 9)  Terazosin Hcl 5 Mg Caps (Terazosin hcl) .Marland Kitchen.. 1 once daily 10)  Centrum Silver Tabs (Multiple vitamins-minerals) .... Once daily 11)  Lorazepam 0.5 Mg Tabs (Lorazepam) .Marland Kitchen.. 1 two times a day as needed 12)  Allegra-d 12 Hour 60-120 Mg Tb12 (Fexofenadine-pseudoephedrine) .... One twice daily 13)  Accu-chek Softclix Lancets Misc (Lancets) .... Uad 14)  Temazepam 15 Mg Caps (Temazepam) .Marland Kitchen.. 1 at bedtime 15)  Byetta 10 Mcg Pen 10 Mcg/0.2ml Soln (Exenatide) .... Use  twice daily 16)  Accu-chek Aviva Strp (Glucose blood) .... Use daily 17)  Bd Pen Needle Mini U/f 31g X 5 Mm Misc (Insulin pen  needle) .... Bid 18)  Fish Oil 300 Mg Caps (Omega-3 fatty acids) .... Qd  Other Orders: Admin 1st Vaccine (02542) Flu Vaccine 92yrs + (70623)  Patient Instructions: 1)  Please schedule a follow-up appointment in 3 months. 2)  Limit your Sodium (Salt). 3)  It is important that you exercise regularly at least 20 minutes 5 times a week. If you develop chest pain, have severe difficulty breathing, or feel very tired , stop exercising immediately and seek medical attention. 4)  You need to lose weight. Consider a lower calorie diet and regular exercise.  5)  Check your blood sugars regularly. If your readings are usually above : or below 70 you should contact our office. 6)  It is important that your Diabetic A1c level is checked every 3 months. 7)  See your eye doctor yearly to check for diabetic eye damage.   Orders Added: 1)  Admin 1st Vaccine [90471] 2)  Flu Vaccine 75yrs + [90658] 3)  Est. Patient Level IV [76283] 4)  Venipuncture [36415] 5)  TLB-A1C / Hgb A1C (Glycohemoglobin) [83036-A1C] 6)  Specimen Handling [99000]

## 2010-07-03 NOTE — Assessment & Plan Note (Signed)
Summary: cpx//ccm   Vital Signs:  Patient profile:   60 year old male Height:      66.25 inches Weight:      172 pounds BMI:     27.65 Temp:     98.3 degrees F oral BP sitting:   122 / 70  (right arm) Cuff size:   regular  Vitals Entered By: Duard Brady LPN (Oct 24, 2009 9:47 AM) CC: cpx - doing well , labs done ,      fbs 137 Is Patient Diabetic? Yes Did you bring your meter with you today? No   CC:  cpx - doing well , labs done , and fbs 137.  History of Present Illness: 60 year old who is seen today for a comprehensive evaluation.  He has a remote history of lymphoma that has been in remission.  He has type 2 diabetes, history of congestive heart failure, hypertension, and dyslipidemia.  He has cerebrovascular disease.  Clinically, he does quite well and denies any cardiopulmonary or focal neurological symptoms.  Preventive Screening-Counseling & Management  Alcohol-Tobacco     Smoking Status: quit  Allergies: 1)  Codeine Phosphate (Codeine Phosphate)  Past History:  Past Medical History: Reviewed history from 12/18/2007 and no changes required. Hodgkin's lymphoma stage IV B. status post ABVD chemotherapy-2000 Diabetes mellitus, type II Hypertension left ventricular dysfunction -chemotherapy-induced (2-D echocardiogram 42%) Cerebrovascular accident, hx of right brain subcortical stroke  Hyperlipidemia history of left S1 radiculopathy pneumonia 2005, February 2008  Past Surgical History: Reviewed history from 12/18/2007 and no changes required. lymph node biopsy heart catheterization June 2002  colonoscopy 2003 2-D echocardiogram-April 2002 ejection fraction 55 to 65%  Family History: Reviewed history from 02/10/2007 and no changes required. father died 36, congestive heart failure mother died age 23, diabetes, coronary disease and some brothers 7 sisters positive for colonic polyps.  Lung cancer; sister- Ca ? type probable lung ca; brother died  probable CAD  Social History: Reviewed history from 06/21/2008 and no changes required. Married presently his exercise  regularly at  local [employer]   Review of Systems  The patient denies anorexia, fever, weight loss, weight gain, vision loss, decreased hearing, hoarseness, chest pain, syncope, dyspnea on exertion, peripheral edema, prolonged cough, headaches, hemoptysis, abdominal pain, melena, hematochezia, severe indigestion/heartburn, hematuria, incontinence, genital sores, muscle weakness, suspicious skin lesions, transient blindness, difficulty walking, depression, unusual weight change, abnormal bleeding, enlarged lymph nodes, angioedema, breast masses, and testicular masses.    Physical Exam  General:  overweight-appearing.  normal blood pressure Head:  Normocephalic and atraumatic without obvious abnormalities. No apparent alopecia or balding. Eyes:  No corneal or conjunctival inflammation noted. EOMI. Perrla. Funduscopic exam benign, without hemorrhages, exudates or papilledema. Vision grossly normal. Ears:  External ear exam shows no significant lesions or deformities.  Otoscopic examination reveals clear canals, tympanic membranes are intact bilaterally without bulging, retraction, inflammation or discharge. Hearing is grossly normal bilaterally. Nose:  External nasal examination shows no deformity or inflammation. Nasal mucosa are pink and moist without lesions or exudates. Mouth:  Oral mucosa and oropharynx without lesions or exudates.  Teeth in good repair. Neck:  No deformities, masses, or tenderness noted. Chest Wall:  No deformities, masses, tenderness or gynecomastia noted. Breasts:  No masses or gynecomastia noted Lungs:  Normal respiratory effort, chest expands symmetrically. Lungs are clear to auscultation, no crackles or wheezes. Heart:  Normal rate and regular rhythm. S1 and S2 normal without gallop, murmur, click, rub or other extra sounds. Abdomen:  Bowel sounds  positive,abdomen soft and non-tender without masses, organomegaly or hernias noted. Rectal:  No external abnormalities noted. Normal sphincter tone. No rectal masses or tenderness. Genitalia:  Testes bilaterally descended without nodularity, tenderness or masses. No scrotal masses or lesions. No penis lesions or urethral discharge. Prostate:  1+ enlarged.   Msk:  No deformity or scoliosis noted of thoracic or lumbar spine.   Pulses:  R and L carotid,radial,femoral,dorsalis pedis and posterior tibial pulses are full and equal bilaterally Extremities:  No clubbing, cyanosis, edema, or deformity noted with normal full range of motion of all joints.   Neurologic:  No cranial nerve deficits noted. Station and gait are normal. Plantar reflexes are down-going bilaterally. DTRs are symmetrical throughout. Sensory, motor and coordinative functions appear intact. Skin:  Intact without suspicious lesions or rashes Cervical Nodes:  No lymphadenopathy noted Axillary Nodes:  No palpable lymphadenopathy Inguinal Nodes:  No significant adenopathy Psych:  Cognition and judgment appear intact. Alert and cooperative with normal attention span and concentration. No apparent delusions, illusions, hallucinations  Diabetes Management Exam:    Foot Exam (with socks and/or shoes not present):       Sensory-Pinprick/Light touch:          Left medial foot (L-4): normal          Left dorsal foot (L-5): normal          Left lateral foot (S-1): normal          Right medial foot (L-4): normal          Right dorsal foot (L-5): normal          Right lateral foot (S-1): normal       Sensory-Monofilament:          Left foot: normal          Right foot: normal       Inspection:          Left foot: normal          Right foot: normal       Nails:          Left foot: normal          Right foot: normal    Foot Exam by Podiatrist:       Date: 10/24/2009       Results: no diabetic findings       Done by: PCP    Eye  Exam:       Eye Exam done here today          Results: normal   Impression & Recommendations:  Problem # 1:  Preventive Health Care (ICD-V70.0)  Complete Medication List: 1)  Accu-chek Comfort Curve Strp (Glucose blood) 2)  Aspirin Buffered 325 Mg Tabs (Aspirin buff(mgcarb-alaminoac)) .... Once daily 3)  Caduet 10-10 Mg Tabs (Amlodipine-atorvastatin) .Marland Kitchen.. 1 once daily 4)  Fosinopril Sodium 20 Mg Tabs (Fosinopril sodium) .Marland Kitchen.. 1 two times a day 5)  Glimepiride 4 Mg Tabs (Glimepiride) .Marland Kitchen.. 1 two times a day 6)  Klor-con M20 20 Meq Tbcr (Potassium chloride crys cr) .Marland Kitchen.. 1 once daily 7)  Metformin Hcl 1000 Mg Tabs (Metformin hcl) .Marland Kitchen.. 1 two times a day 8)  Metoprolol Tartrate 50 Mg Tabs (Metoprolol tartrate) .Marland Kitchen.. 1 once daily 9)  Terazosin Hcl 5 Mg Caps (Terazosin hcl) .Marland Kitchen.. 1 once daily 10)  Centrum Silver Tabs (Multiple vitamins-minerals) .... Once daily 11)  Lorazepam 0.5 Mg Tabs (Lorazepam) .Marland Kitchen.. 1 two times a day as needed 12)  Allegra-d 12  Hour 60-120 Mg Tb12 (Fexofenadine-pseudoephedrine) .... One twice daily 13)  Accu-chek Softclix Lancets Misc (Lancets) .... Uad 14)  Temazepam 15 Mg Caps (Temazepam) .Marland Kitchen.. 1 at bedtime 15)  Byetta 10 Mcg Pen 10 Mcg/0.45ml Soln (Exenatide) .... Use  twice daily 16)  Accu-chek Aviva Strp (Glucose blood) .... Use daily 17)  Bd Pen Needle Mini U/f 31g X 5 Mm Misc (Insulin pen needle) .... Bid  Other Orders: EKG w/ Interpretation (93000)  Patient Instructions: 1)  Please schedule a follow-up appointment in 3 months. 2)  Limit your Sodium (Salt). 3)  It is important that you exercise regularly at least 20 minutes 5 times a week. If you develop chest pain, have severe difficulty breathing, or feel very tired , stop exercising immediately and seek medical attention. 4)  You need to lose weight. Consider a lower calorie diet and regular exercise.  5)  Check your blood sugars regularly. If your readings are usually above : or below 70 you should contact our  office. 6)  It is important that your Diabetic A1c level is checked every 3 months. Prescriptions: TEMAZEPAM 15 MG  CAPS (TEMAZEPAM) 1 at bedtime  #30 x 6   Entered and Authorized by:   Gordy Savers  MD   Signed by:   Gordy Savers  MD on 10/24/2009   Method used:   Print then Give to Patient   RxID:   [id number] LORAZEPAM 0.5 MG  TABS (LORAZEPAM) 1 two times a day as needed  #50 x 2   Entered and Authorized by:   Gordy Savers  MD   Signed by:   Gordy Savers  MD on 10/24/2009   Method used:   Print then Give to Patient   RxID:   [id number] ACCU-CHEK COMFORT CURVE  STRP (GLUCOSE BLOOD)   #100 Each x 6   Entered and Authorized by:   Gordy Savers  MD   Signed by:   Gordy Savers  MD on 10/24/2009   Method used:   Print then Give to Patient   RxID:   [id number] BD PEN NEEDLE MINI U/F 31G X 5 MM MISC (INSULIN PEN NEEDLE) bid  #300 x 5   Entered and Authorized by:   Gordy Savers  MD   Signed by:   Gordy Savers  MD on 10/24/2009   Method used:   Electronically to        [facility]  [phone]* (retail)       [address] [place], [place]  [address]       Ph: [phone] or [phone]       Fax: [phone]   RxID:   [id number] ACCU-CHEK AVIVA  STRP (GLUCOSE BLOOD) use daily  #100 x 5   Entered and Authorized by:   Gordy Savers  MD   Signed by:   Gordy Savers  MD on 10/24/2009   Method used:   Electronically to        [facility]  [phone]* (retail)       [address] [place], [place]  [address]       Ph: [phone] or [phone]       Fax: [phone]   RxID:   [id number] BYETTA 10 MCG PEN 10 MCG/0.04ML SOLN (EXENATIDE) use  twice daily  #3 months x 6   Entered and Authorized by:   Theron Arista  Lysle Dingwall  MD   Signed by:   Gordy Savers  MD on 10/24/2009   Method used:   Electronically to        [facility]  [phone]* (retail)       [address] [place], [place]  [address]       Ph: [phone] or [phone]       Fax: [phone]   RxID:   [id number] ACCU-CHEK SOFTCLIX LANCETS   MISC (LANCETS) UAD  #102 Each x 6   Entered and Authorized by:   Gordy Savers  MD   Signed by:   Gordy Savers  MD on 10/24/2009   Method used:   Electronically to        [facility]  [phone]* (retail)       [address] [place], [place]  [address]       Ph: [phone] or [phone]       Fax: [phone]   RxID:   [id number] ALLEGRA-D 12 HOUR 60-120 MG  TB12 (FEXOFENADINE-PSEUDOEPHEDRINE) one twice daily  #60 Tablet x 2   Entered and Authorized by:   Gordy Savers  MD   Signed by:   Gordy Savers  MD on 10/24/2009   Method used:   Electronically to        [facility]  [phone]* (retail)       [address] [place], [place]  [address]       Ph: [phone] or [phone]       Fax: [phone]   RxID:   [id number] TERAZOSIN HCL 5 MG CAPS (TERAZOSIN HCL) 1 once daily  #90 Capsule x 5   Entered and Authorized by:   Gordy Savers  MD   Signed by:   Gordy Savers  MD on 10/24/2009   Method used:   Electronically to        [facility]  [phone]* (retail)       [address] [place], [place]  [address]       Ph: [phone] or [phone]       Fax: [phone]   RxID:   [id number] METOPROLOL TARTRATE 50 MG  TABS (METOPROLOL TARTRATE) 1 once daily  #90 Tablet x 6   Entered and Authorized by:   Gordy Savers  MD   Signed by:   Gordy Savers  MD on 10/24/2009   Method used:   Electronically to        [facility]  [phone]* (retail)       [address] [place], [place]  [address]       Ph: [phone] or [phone]       Fax: [phone]   RxID:   [id number] METFORMIN HCL 1000 MG TABS (METFORMIN HCL) 1 two times a day  #180 x 11   Entered and Authorized by:   Gordy Savers  MD   Signed by:    Gordy Savers  MD on 10/24/2009   Method used:   Electronically to        [facility]  [phone]* (retail)       [address] [place], [place]  [address]       Ph: [phone] or  [phone]       Fax: [phone]   RxID:   [id number] KLOR-CON M20 20 MEQ TBCR (POTASSIUM CHLORIDE CRYS CR) 1 once daily  #90 x 6   Entered and Authorized by:   Gordy Savers  MD   Signed by:   Gordy Savers  MD on 10/24/2009   Method used:   Electronically to        [facility]  [phone]* (retail)       [address] [place], [place]  [address]       Ph: [phone] or [phone]       Fax: [phone]   RxID:   [id number] GLIMEPIRIDE 4 MG TABS (GLIMEPIRIDE) 1 two times a day  #180 x 6   Entered and Authorized by:   Gordy Savers  MD   Signed by:   Gordy Savers  MD on 10/24/2009   Method used:   Electronically to        [facility]  [phone]* (retail)       [address] [place], [place]  [address]       Ph: [phone] or [phone]       Fax: [phone]   RxID:   [id number] FOSINOPRIL SODIUM 20 MG TABS (FOSINOPRIL SODIUM) 1 two times a day  #180 x 6   Entered and Authorized by:   Gordy Savers  MD   Signed by:   Gordy Savers  MD on 10/24/2009   Method used:   Electronically to        [facility]  [phone]* (retail)       [address] [place], [place]  [address]       Ph: [phone] or [phone]       Fax: [phone]   RxID:   [id number] CADUET 10-10 MG TABS (AMLODIPINE-ATORVASTATIN) 1 once daily  #90 x 6   Entered and Authorized by:   Gordy Savers  MD   Signed by:   Gordy Savers  MD on 10/24/2009   Method used:   Electronically to        [facility]  [phone]* (retail)       [address] [place], [place]  [address]       Ph: [phone] or [phone]       Fax: [phone]   RxID:   [id number]

## 2010-07-03 NOTE — Progress Notes (Signed)
Summary: temazepam refill  Phone Note Refill Request Message from:  Fax from Pharmacy on September 21, 2009 11:56 AM  Refills Requested: Medication #1:  TEMAZEPAM 15 MG  CAPS 1 at bedtime [facility]   [phone]     fax [phone]   Method Requested: Telephone to Pharmacy Initial call taken by: Duard Brady LPN,  September 21, 2009 11:57 AM    Prescriptions: TEMAZEPAM 15 MG  CAPS (TEMAZEPAM) 1 at bedtime  #30 x 6   Entered by:   Duard Brady LPN   Authorized by:   Gordy Savers  MD   Signed by:   Duard Brady LPN on 19/14/7829   Method used:   Historical   RxID:   [id number]

## 2010-07-03 NOTE — Assessment & Plan Note (Signed)
Summary: 3 month rov/njr   Vital Signs:  Patient profile:   60 year old male Weight:      172 pounds Temp:     98.1 degrees F oral BP sitting:   128 / 78  (left arm) Cuff size:   regular  Vitals Entered By: Duard Brady LPN (January 23, 2010 10:33 AM) CC: 3 mos rov - doing ok     fbs 121 Is Patient Diabetic? Yes Did you bring your meter with you today? No   CC:  3 mos rov - doing ok     fbs 121.  History of Present Illness:  60 year old patient who is in today for follow-up of his hypertension, diabetes.  He has a history of cerebrovascular disease, as well as history of congestive heart failure.  He is treated dyslipidemia.  He is doing quite well and denies any cardiopulmonary complaints.  Although there has been no weight loss.  His glycemic control has improved.  His last hemoglobin A1c was 7.3.  Denies any focal neurological symptoms.  Laboratory studies were reviewed from his physical this past spring  Preventive Screening-Counseling & Management  Alcohol-Tobacco     Smoking Status: quit  Allergies: 1)  Codeine Phosphate (Codeine Phosphate)  Past History:  Past Medical History: Reviewed history from 12/18/2007 and no changes required. Hodgkin's lymphoma stage IV B. status post ABVD chemotherapy-2000 Diabetes mellitus, type II Hypertension left ventricular dysfunction -chemotherapy-induced (2-D echocardiogram 42%) Cerebrovascular accident, hx of right brain subcortical stroke  Hyperlipidemia history of left S1 radiculopathy pneumonia 2005, February 2008  Review of Systems  The patient denies anorexia, fever, weight loss, weight gain, vision loss, decreased hearing, hoarseness, chest pain, syncope, dyspnea on exertion, peripheral edema, prolonged cough, headaches, hemoptysis, abdominal pain, melena, hematochezia, severe indigestion/heartburn, hematuria, incontinence, genital sores, muscle weakness, suspicious skin lesions, transient blindness, difficulty walking,  depression, unusual weight change, abnormal bleeding, enlarged lymph nodes, angioedema, breast masses, and testicular masses.    Physical Exam  General:  overweight-appearing.  130/78overweight-appearing.   Head:  Normocephalic and atraumatic without obvious abnormalities. No apparent alopecia or balding. Eyes:  No corneal or conjunctival inflammation noted. EOMI. Perrla. Funduscopic exam benign, without hemorrhages, exudates or papilledema. Vision grossly normal. Mouth:  Oral mucosa and oropharynx without lesions or exudates.  Teeth in good repair. Neck:  No deformities, masses, or tenderness noted. Lungs:  Normal respiratory effort, chest expands symmetrically. Lungs are clear to auscultation, no crackles or wheezes. Heart:  Normal rate and regular rhythm. S1 and S2 normal without gallop, murmur, click, rub or other extra sounds. Abdomen:  Bowel sounds positive,abdomen soft and non-tender without masses, organomegaly or hernias noted. Msk:  No deformity or scoliosis noted of thoracic or lumbar spine.   Pulses:  R and L carotid,radial,femoral,dorsalis pedis and posterior tibial pulses are full and equal bilaterally  Diabetes Management Exam:    Foot Exam (with socks and/or shoes not present):       Sensory-Pinprick/Light touch:          Left medial foot (L-4): normal          Left dorsal foot (L-5): normal          Left lateral foot (S-1): normal          Right medial foot (L-4): normal          Right dorsal foot (L-5): normal          Right lateral foot (S-1): normal  Sensory-Monofilament:          Left foot: normal          Right foot: normal       Inspection:          Left foot: normal          Right foot: normal       Nails:          Left foot: normal          Right foot: normal    Foot Exam by Podiatrist:       Date: 01/23/2010       Results: no diabetic findings       Done by: PCP   Impression & Recommendations:  Problem # 1:  CONGESTIVE HEART FAILURE  (ICD-428.0)  His updated medication list for this problem includes:    Aspirin Buffered 325 Mg Tabs (Aspirin buff(mgcarb-alaminoac)) ..... Once daily    Fosinopril Sodium 20 Mg Tabs (Fosinopril sodium) .Marland Kitchen... 1 two times a day    Metoprolol Tartrate 50 Mg Tabs (Metoprolol tartrate) .Marland Kitchen... 1 once daily  His updated medication list for this problem includes:    Aspirin Buffered 325 Mg Tabs (Aspirin buff(mgcarb-alaminoac)) ..... Once daily    Fosinopril Sodium 20 Mg Tabs (Fosinopril sodium) .Marland Kitchen... 1 two times a day    Metoprolol Tartrate 50 Mg Tabs (Metoprolol tartrate) .Marland Kitchen... 1 once daily  Problem # 2:  HYPERTENSION (ICD-401.9)  His updated medication list for this problem includes:    Caduet 10-10 Mg Tabs (Amlodipine-atorvastatin) .Marland Kitchen... 1 once daily    Fosinopril Sodium 20 Mg Tabs (Fosinopril sodium) .Marland Kitchen... 1 two times a day    Metoprolol Tartrate 50 Mg Tabs (Metoprolol tartrate) .Marland Kitchen... 1 once daily    Terazosin Hcl 5 Mg Caps (Terazosin hcl) .Marland Kitchen... 1 once daily  His updated medication list for this problem includes:    Caduet 10-10 Mg Tabs (Amlodipine-atorvastatin) .Marland Kitchen... 1 once daily    Fosinopril Sodium 20 Mg Tabs (Fosinopril sodium) .Marland Kitchen... 1 two times a day    Metoprolol Tartrate 50 Mg Tabs (Metoprolol tartrate) .Marland Kitchen... 1 once daily    Terazosin Hcl 5 Mg Caps (Terazosin hcl) .Marland Kitchen... 1 once daily  Problem # 3:  DIABETES MELLITUS, TYPE II (ICD-250.00)  His updated medication list for this problem includes:    Aspirin Buffered 325 Mg Tabs (Aspirin buff(mgcarb-alaminoac)) ..... Once daily    Fosinopril Sodium 20 Mg Tabs (Fosinopril sodium) .Marland Kitchen... 1 two times a day    Glimepiride 4 Mg Tabs (Glimepiride) .Marland Kitchen... 1 two times a day    Metformin Hcl 1000 Mg Tabs (Metformin hcl) .Marland Kitchen... 1 two times a day    Byetta 10 Mcg Pen 10 Mcg/0.56ml Soln (Exenatide) ..... Use  twice daily  Orders: Venipuncture ([address]) TLB-A1C / Hgb A1C (Glycohemoglobin) (83036-A1C) Specimen Handling (60454)  His updated  medication list for this problem includes:    Aspirin Buffered 325 Mg Tabs (Aspirin buff(mgcarb-alaminoac)) ..... Once daily    Fosinopril Sodium 20 Mg Tabs (Fosinopril sodium) .Marland Kitchen... 1 two times a day    Glimepiride 4 Mg Tabs (Glimepiride) .Marland Kitchen... 1 two times a day    Metformin Hcl 1000 Mg Tabs (Metformin hcl) .Marland Kitchen... 1 two times a day    Byetta 10 Mcg Pen 10 Mcg/0.65ml Soln (Exenatide) ..... Use  twice daily  Problem # 4:  HYPERLIPIDEMIA (ICD-272.4)  His updated medication list for this problem includes:    Caduet 10-10 Mg Tabs (Amlodipine-atorvastatin) .Marland Kitchen... 1 once daily  His updated  medication list for this problem includes:    Caduet 10-10 Mg Tabs (Amlodipine-atorvastatin) .Marland Kitchen... 1 once daily  Complete Medication List: 1)  Accu-chek Comfort Curve Strp (Glucose blood) 2)  Aspirin Buffered 325 Mg Tabs (Aspirin buff(mgcarb-alaminoac)) .... Once daily 3)  Caduet 10-10 Mg Tabs (Amlodipine-atorvastatin) .Marland Kitchen.. 1 once daily 4)  Fosinopril Sodium 20 Mg Tabs (Fosinopril sodium) .Marland Kitchen.. 1 two times a day 5)  Glimepiride 4 Mg Tabs (Glimepiride) .Marland Kitchen.. 1 two times a day 6)  Klor-con M20 20 Meq Tbcr (Potassium chloride crys cr) .Marland Kitchen.. 1 once daily 7)  Metformin Hcl 1000 Mg Tabs (Metformin hcl) .Marland Kitchen.. 1 two times a day 8)  Metoprolol Tartrate 50 Mg Tabs (Metoprolol tartrate) .Marland Kitchen.. 1 once daily 9)  Terazosin Hcl 5 Mg Caps (Terazosin hcl) .Marland Kitchen.. 1 once daily 10)  Centrum Silver Tabs (Multiple vitamins-minerals) .... Once daily 11)  Lorazepam 0.5 Mg Tabs (Lorazepam) .Marland Kitchen.. 1 two times a day as needed 12)  Allegra-d 12 Hour 60-120 Mg Tb12 (Fexofenadine-pseudoephedrine) .... One twice daily 13)  Accu-chek Softclix Lancets Misc (Lancets) .... Uad 14)  Temazepam 15 Mg Caps (Temazepam) .Marland Kitchen.. 1 at bedtime 15)  Byetta 10 Mcg Pen 10 Mcg/0.62ml Soln (Exenatide) .... Use  twice daily 16)  Accu-chek Aviva Strp (Glucose blood) .... Use daily 17)  Bd Pen Needle Mini U/f 31g X 5 Mm Misc (Insulin pen needle) .... Bid 18)  Fish Oil  300 Mg Caps (Omega-3 fatty acids) .... Qd  Patient Instructions: 1)  Please schedule a follow-up appointment in 3 months. 2)  It is important that you exercise regularly at least 20 minutes 5 times a week. If you develop chest pain, have severe difficulty breathing, or feel very tired , stop exercising immediately and seek medical attention. 3)  You need to lose weight. Consider a lower calorie diet and regular exercise.  4)  Check your blood sugars regularly. If your readings are usually above : or below 70 you should contact our office. 5)  It is important that your Diabetic A1c level is checked every 3 months. 6)  See your eye doctor yearly to check for diabetic eye damage.

## 2010-07-03 NOTE — Letter (Signed)
[facility]   Imported By: Maryln Gottron 07/06/2009 11:29:49  _____________________________________________________________________  External Attachment:    Type:   Image     Comment:   External Document

## 2010-07-03 NOTE — Progress Notes (Signed)
Summary: rfill lorazepam  Phone Note Refill Request Message from:  Fax from Pharmacy on September 21, 2009 12:00 PM  Refills Requested: Medication #1:  LORAZEPAM 0.5 MG  TABS 1 two times a day as needed [facility]   Method Requested: Telephone to Pharmacy Initial call taken by: Duard Brady LPN,  September 21, 2009 12:00 PM    Prescriptions: LORAZEPAM 0.5 MG  TABS (LORAZEPAM) 1 two times a day as needed  #50 x 2   Entered by:   Duard Brady LPN   Authorized by:   Gordy Savers  MD   Signed by:   Duard Brady LPN on 04/54/0981   Method used:   Historical   RxID:   [id number]

## 2010-07-05 NOTE — Progress Notes (Signed)
Summary: Pt called and has low iron lvl. Pls call back.   Phone Note Call from Patient Call back at Home Phone [phone]   Caller: Patient Summary of Call: Pt called and said that he went to Urgent Care on thurs and test were run that said that pts iron is low. Pls call.  Initial call taken by: Lucy Antigua,  May 21, 2010 11:27 AM  Follow-up for Phone Call        spke with pt - will drop off labs that were run at urgent care - hgb 12.4 hct 38.8 , was told that was low.  I encouraged him to eat diet rich in Fe foods - ok to take otc iron daily or a couple times a week. No other problems or signs at this time.  Will forward info to Dr.Kwiatkowski to see if there are any other instructions /or wants to see. KIK Follow-up by: Duard Brady LPN,  May 21, 2010 11:54 AM  Additional Follow-up for Phone Call Additional follow up Details #1::        agree- will repeat it next, routine office visit Additional Follow-up by: Gordy Savers  MD,  May 21, 2010 12:44 PM

## 2010-07-19 ENCOUNTER — Ambulatory Visit (INDEPENDENT_AMBULATORY_CARE_PROVIDER_SITE_OTHER): Payer: 59 | Admitting: Internal Medicine

## 2010-07-19 ENCOUNTER — Encounter: Payer: Self-pay | Admitting: Internal Medicine

## 2010-07-19 DIAGNOSIS — I10 Essential (primary) hypertension: Secondary | ICD-10-CM

## 2010-07-19 DIAGNOSIS — E119 Type 2 diabetes mellitus without complications: Secondary | ICD-10-CM

## 2010-07-19 DIAGNOSIS — I509 Heart failure, unspecified: Secondary | ICD-10-CM

## 2010-07-19 LAB — HEMOGLOBIN A1C: Hgb A1c MFr Bld: 7.9 % — ABNORMAL HIGH (ref 4.6–6.5)

## 2010-07-19 MED ORDER — ATORVASTATIN CALCIUM 10 MG PO TABS: 10.0000 mg | ORAL_TABLET | Freq: Every day | ORAL | Status: DC

## 2010-07-19 MED ORDER — AMLODIPINE BESYLATE 10 MG PO TABS: 10.0000 mg | ORAL_TABLET | Freq: Every day | ORAL | Status: DC

## 2010-07-19 NOTE — Progress Notes (Signed)
  Subjective:    Patient ID: [name], male    DOB: [birth year], 60 y.o.   MRN: [id number]  HPI   60 year old patient who has a history of type 2 diabetes;  He is done quite well.  His last hemoglobin A1c7.2.  He is followed by oncology due to a history of Hodgkin's lymphoma.  There was some earlier concerns about anemia that has resolved. He has a history of chemotherapy induced LV dysfunction.  He has treated hypertension and dyslipidemia.  Presently is on Caduet that insurance.  No longer covers.  Will switch to generic atorvastatin, and amlodipine.  Denies any cardiopulmonary complaints.  Does exercise regularly with a peak heart rate between 120 and 130 without difficulty  Review of Systems  Constitutional: Negative for fever, chills, appetite change and fatigue.  HENT: Negative for hearing loss, ear pain, congestion, sore throat, trouble swallowing, neck stiffness, dental problem, voice change and tinnitus.   Eyes: Negative for pain, discharge and visual disturbance.  Respiratory: Negative for cough, chest tightness, wheezing and stridor.   Cardiovascular: Negative for chest pain, palpitations and leg swelling.  Gastrointestinal: Negative for nausea, vomiting, abdominal pain, diarrhea, constipation, blood in stool and abdominal distention.  Genitourinary: Negative for urgency, hematuria, flank pain, discharge, difficulty urinating and genital sores.  Musculoskeletal: Negative for myalgias, back pain, joint swelling, arthralgias and gait problem.  Skin: Negative for rash.  Neurological: Negative for dizziness, syncope, speech difficulty, weakness, numbness and headaches.  Hematological: Negative for adenopathy. Does not bruise/bleed easily.  Psychiatric/Behavioral: Negative for behavioral problems and dysphoric mood. The patient is not nervous/anxious.        Objective:   Physical Exam  Constitutional: He is oriented to person, place, and time. He appears well-developed.  HENT:    Head: Normocephalic.  Right Ear: External ear normal.  Left Ear: External ear normal.  Eyes: Conjunctivae and EOM are normal.  Neck: Normal range of motion. No JVD present.  Cardiovascular: Normal rate, regular rhythm, normal heart sounds and intact distal pulses.  Exam reveals no gallop.   Pulmonary/Chest: Effort normal. He has rales.  Abdominal: Soft. Bowel sounds are normal.  Musculoskeletal: Normal range of motion. He exhibits no edema and no tenderness.  Neurological: He is alert and oriented to person, place, and time.  Psychiatric: He has a normal mood and affect. His behavior is normal.          Assessment & Plan:  Diabetes mellitus type II.  Lifestyle issues discussed.  Will attempt modest weight loss, and more vigorous exercise.  He is aware of a hemoglobin A1c goal of less than 7 Hypertension stable Dyslipidemia, stable History of LV dysfunction.

## 2010-07-19 NOTE — Patient Instructions (Signed)
Limit your sodium (Salt) intake   It is important that you exercise regularly, at least 20 minutes 3 to 4 times per week.  If you develop chest pain or shortness of breath seek  medical attention.  Please check your hemoglobin A1c every 3 months  Return in 3 months for follow-up     You need to lose weight.  Consider a lower calorie diet and regular exercise.

## 2010-08-24 ENCOUNTER — Other Ambulatory Visit: Payer: Self-pay | Admitting: Internal Medicine

## 2010-09-26 ENCOUNTER — Encounter: Payer: Self-pay | Admitting: Internal Medicine

## 2010-10-08 ENCOUNTER — Other Ambulatory Visit: Payer: Self-pay

## 2010-10-08 MED ORDER — LORAZEPAM 0.5 MG PO TABS: 0.5000 mg | ORAL_TABLET | Freq: Two times a day (BID) | ORAL | Status: DC | PRN

## 2010-10-08 NOTE — Telephone Encounter (Signed)
Faxed back to [facility] 

## 2010-10-19 NOTE — Consult Note (Signed)
[name], [name]                          ACCOUNT NO.:  [id number]   MEDICAL RECORD NO.:  [id number]                   PATIENT TYPE:  INP   LOCATION:  3030                                 FACILITY:  [facility]   PHYSICIAN:  [name], M.D.           DATE OF BIRTH:  1950-08-15   DATE OF CONSULTATION:  09/05/2002  DATE OF DISCHARGE:                                   CONSULTATION   REASON FOR CONSULTATION:  Stroke.   HISTORY OF PRESENT ILLNESS:  This is an inpatient consultation evaluation of  this existing [name] patient.  He is a 60 year old  man who has been seen by Dr. Anne Hahn in the past for lower extremity pain and  is in the midst of workup for suspected lumbosacral radiculopathy.  The  patient reports that about 5:30 p.m. on Friday evening, he was in a hardware  store and, upon passing someone that he knew, tried to call out the person's  name but realized he was unable to speak.  He had difficulty speaking for  the remainder of the day and was told that he had a drooping of the left  side of his face.  The next morning he awakened and this was worse, and he  noted that he was having great difficulty lifting the left arm.  At that  point, he presented to the emergency room for further evaluation.  He had a  CT of the head at that time which was read as abnormal, demonstrating an  acute infarct, and he was subsequently admitted.  Neurologic consultation  was requested for further evaluation and management of his stroke problem.   The patient reports that today he is feeling better.  His arm is feeling  strong, and his problem with talking is less, although his speech is still  not entirely normal.  He denies previous history of similar events.   PAST MEDICAL HISTORY:  1. Hypertension for about 30 years which is not under ideal control.  2. Diabetes diagnosed four to five years ago which has been under very good     control lately.  3. He  denies ever having a cholesterol problem.  4. History of Hodgkin's lymphoma treated with chemotherapy in 2000.     Subsequent to that, he saw a cardiologist for low ejection fraction and     had a negative ischemic workup at that time.  He denies any previous     history of heart rhythm problems or history of lung, liver, or kidney     disease.   FAMILY HISTORY:  Remarkable for strokes in both his brother and sister at  about the age of 23.   SOCIAL HISTORY:  He smokes about one-half pack of cigarettes per day.  He  works as an Psychologist, educational and is independent in activities of daily living.   ALLERGIES:  CODEINE causes hives.   MEDICATIONS:  1. Hytrin 5 mg b.i.d.  2. Imidapril 20 mg b.i.d.  3. Toprol 100 mg b.i.d.  4. Potassium 20 mEq daily.  5. Actos 15 mg daily.  6. Prandin 2 mg t.i.d.  7. Glucophage 1000 mg daily.  8. Lasix 40 mg daily.  9. Multivitamins.  10.      Restoril p.r.n.  11.      Norvasc 5 mg daily.   He did not take aspirin prior to admission.   REVIEW OF SYSTEMS:  On questioning, he denies any recent headache, chest  pain, palpitations, shortness of breath, nausea, vomiting, or urinary  symptoms.  He does report that he has not had bowel movement since being in  the hospital.  Otherwise per admission H&P and admission nursing records.  He does report history of pain and subjective weakness in all his  extremities since undergoing chemotherapy and has particular trouble with  his back and left leg.  He is seeing Dr. Anne Hahn who has arranged MRI of the  lumbosacral spine.   PHYSICAL EXAMINATION:  VITAL SIGNS:  Temperature 98.6, blood pressure  162/88, pulse 58, respirations 18.  GENERAL:  Healthy-appearing male in no evident distress.  HEENT:  Cranium normocephalic and atraumatic.  Oropharynx benign.  NECK:  Supple without carotid bruits.  HEART:  Regular rate and rhythm without murmurs.  NEUROLOGIC:  Mental Status:  He is awake, alert, oriented.  Speech is  fluid  and mildly dysarthric.  Mood is euthymic, and affect is appropriate.  Cranial Nerves:  Pupils equal and briskly reactive.  Extraocular movements  normal with no nystagmus.  Visual fields full to confrontation.  He has  moderate to severe weakness of the left lower face with intact eye closure.  Facial sensation is diminished to pinprick on the left.  Tongue and palate  moved normally and symmetrically.  Motor: Normal bulk and tone.  There was a  bit of weakness in muscles of left hand, otherwise normal strength  throughout.  Sensation is diminished in the left hand compared to the right.  Lower extremity pinprick is intact.  Rapid alternating movements are  performed slowly on the left.  Finger-to-nose is performed adequately.  Reflexes are generally diminished throughout.  Toes are downgoing.  On gait  exam, he rises a bit slowly from the chair.  He does favor his right leg a  bit due to discomfort in his left leg.  He is, however, able to heel, toe,  and tandem walk.   LABORATORY DATA:  CBC is unremarkable.  BMET is remarkable for elevated  glucose of 202, elevated total bilirubin of 2.0 with otherwise normal liver  function tests.  Cardiac enzymes are negative x 3.  PTT is normal, but  prothrombin time is low.   EKG is read as demonstrating junctional rhythm, although it seems to  demonstrate normal sinus rhythm with T wave inversions in the inferior and  lateral leads.   CT of the head is personally reviewed and demonstrates hypolucency  consistent with acute infarct in the right periventricular white matter,  otherwise unremarkable study.   IMPRESSION:  1. Right brain subcortical stroke with left facial and mild left upper     extremity weakness, sensory changes, and dysarthria, improving.  2. Hypertension, fair control.  3. Diabetes, good control.  4. 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 5. History of Hodgkin's lymphoma status post chemotherapy.   6. Low back and left lower extremity pain, possible lumbosacral     radiculopathy.  PLAN:  A 2-D echocardiogram and carotid Dopplers have been ordered.  Will  also add an MRI of the brain with intracranial MRA.  Will continue heparin  for now pending the echocardiogram given the history of possible  cardiomyopathy.  Lipid panel is pending.  Will also check a fasting  homocysteine level.  Hemoglobin A1C is actually good at 6.1.  Will add an  aspirin daily.  Will also check an MRI of the lumbosacral spine while he is  here for further assessment of this radiculopathy problem.  Stroke service  will follow.   Thank you for this consultation.                                               Michael L. Thad Ranger, M.D.    MLR/MEDQ  D:  09/05/2002  T:  09/06/2002  Job:  [id number]   cc:   Titus Dubin. Alwyn Ren, M.D. [facility]   Gordy Savers, M.D. [facility]

## 2010-10-19 NOTE — H&P (Signed)
[name], KEEP                          ACCOUNT NO.:  [id number]   MEDICAL RECORD NO.:  [id number]                   PATIENT TYPE:  EMS   LOCATION:  MAJO                                 FACILITY:  [facility]   PHYSICIAN:  Titus Dubin. Alwyn Ren, M.D. 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   HISTORY OF PRESENT ILLNESS:  The presentation began as a left facial droop  and weakness of the left upper extremity on 09/03/2002 at approximately 3:30  to 4 p.m.  It progressed over the next hour and was associated with slurred  speech.  Despite this, his wife thought it would go away and waited for  the morning to call the doctor on call who prompted them to come to the  emergency room where he was evaluated and admitted.   PAST MEDICAL HISTORY:  1. No hospitalizations.  2. History of hypertension.  3. Hodgkin's lymphoma in 2000 for which he was treated by Dr. Rolm Baptise.   ALLERGIES:  CODEINE.   MEDICATIONS:  1. He is on polypharmacy for his hypertension with terazosin 5 mg twice a     day, Monopril 20 mg twice a day, Norvasc 5 mg daily, and metoprolol 100     mg twice a day.  2. Potassium 20 mEq daily.  3. Lasix 40 mg daily.  4. Prandin 10 mg before meals.  5. Actos 15 mg.  6. Glucophage 1000 mg daily for his diabetes.  7. Temazepam 15 mg as needed.  8. Multivitamins.   SOCIAL HISTORY:  He initially told me he smoked five to eight cigarettes per  day but then over a period of time of the interview it increased to a half a  pack a day.  He drinks alcohol occasionally.   FAMILY HISTORY:  Negative for myocardial infarction.  Mother had diabetes as  did a brother.  A brother had stroke and  also cancer, possibly of the lung.  A sister had cancer.  Another sister had cancer of unknown primary.  Aunts  and uncles have also had diabetes.   REVIEW OF SYSTEMS:  He was seen this past week by Dr. Lesia Sago for  unexplained pain in his arms and legs with weakness that has been present  since his chemotherapy in 2000.   He checks his sugars irregularly, but they have ranged from 96 to a high of  160.  He is not checking two-hour p.c. glucoses.  He denies polyuria,  polyphagia, or polydipsia.   He denies chest pain, palpitations, or shortness of breath.  He has had no  genitourinary symptoms.  He denies fever,  chills, sweats, weight loss, or  rash.   PHYSICAL EXAMINATION:  GENERAL:  He is in no acute distress.  He has obvious  facial asymmetry at rest.  VITAL SIGNS:  Blood pressure 155/78, pulse 71, respiratory rate 18 and no  increased work of breathing.  HEENT:  There is left facial weakness with bilateral ptosis of the upper  lids.  Facial weakness is most apparent with smiling.  Pattern alopecia is  noted.  He has arteriole narrowing.  NECK:  There is suggestion of slight thyroid asymmetry with left thyroid  being smaller than the right.  I could not appreciate a carotid bruit.  HEART:  Heart sounds are somewhat distant.  Breath sounds are also  decreased.  ABDOMEN:  Protuberant without organomegaly or masses.  There is slight  dullness to percussion in the right upper quadrant.  All pulses are intact,  and he has no edema.  GENITOURINARY:  Not performed at this time.  NEUROLOGIC:  Unremarkable.  Strength in the arms is good as it is in the  lower extremities.   LABORATORY DATA:  EKG reveals ST-T wave depression inferolaterally.  Normal  CBC and differential, PT, PTT, and chemistries, with the glucose of a 202.  His potassium was 4.3.  Bilirubin total was 2.  Liver enzymes were normal.   IMPRESSION AND PLAN:  He is admitted with a cerebrovascular accident which  is  nonhemorrhagic in the context of diabetes and questionable referral and  continued smoking.   He will be placed on heparin protocol.  Diabetes will be monitored t.i.d.,  q. a.c., and q.h.s..  Glucophage will be held.  He will be on a sliding  scale coverage.  Blood pressure control will be maintained.  Smoking  cessation has already been discussed.  He was offered a patch which he is  desirous of using.                                               Titus Dubin. Alwyn Ren, M.D. [facility]    WFH/MEDQ  D:  09/04/2002  T:  09/06/2002  Job:  [id number]   cc:   Gordy Savers, M.D. [facility]   Marlan Palau, M.D.  [address]  [address]  [place]  [place] [address]  Fax: [phone]   Leighton Roach. Truett Perna, M.D.  [address] [facility]  [place]  [place]  [address]  Fax: [phone]

## 2010-10-19 NOTE — Discharge Summary (Signed)
NAMEWESTYN, DRIGGERS                          ACCOUNT NO.:  [id number]   MEDICAL RECORD NO.:  [id number]                   PATIENT TYPE:  INP   LOCATION:  5707                                 FACILITY:  [facility]   PHYSICIAN:  Rene Paci, M.D. [facility]          DATE OF BIRTH:  [birth year]   DATE OF ADMISSION:  06/24/2003  DATE OF DISCHARGE:  06/29/2003                                 DISCHARGE SUMMARY   DISCHARGE DIAGNOSES:  1. Left pneumonia.  2. Respiratory insufficiency.  3. Acute exacerbation of chronic obstructive pulmonary disease.  4. Steroid induced hyperglycemia.   HISTORY:  Mr. Vroom is a 60 year old male who was sent to the emergency room  from Urgent Care with a left pneumonia.  The patient had been found to be  hypoxic and tachypneic in the emergency room.  The patient had described a  three to four day history of productive cough.  Over-the-counter medications  were not effective.   PAST MEDICAL HISTORY:  1. Hypertension, labile.  2. Adult onset diabetes mellitus.  3. Peripheral vascular disease with a stenosis in the proximal right     posterior cerebral artery.  4. History of a right frontoparietal cerebrovascular accident with left     upper extremity weakness in April 2004.  5. Lumbar disk disease.  6. Dyslipidemia.  7. Hodgkins lymphoma, status post chemotherapy in 2000.  8. Chronic tobacco.  9. Question of a chemotherapy induced cardiomyopathy.   HOSPITAL COURSE:  #1 -  PULMONARY:  The patient presented with respiratory  insufficiency.  This was felt to be secondary to a left lingular pneumonia  as well as acute exacerbation of chronic obstructive pulmonary disease.  The  patient was treated with oxygen, steroids, and nebulizers.  He was slowly  tapered to oral antibiotics and oral steroids.  The patient continued to  desaturate with ambulation.  The patient was desperate to go home and  stating that I am an adult and I will take care of myself.  Therefore,  the  patient was released with intermittent hypoxemia.  O2 saturations on room  air at rest were 91% on room air.  #2 -  INFECTIOUS DISEASE:  The patient was afebrile at discharge.  The  patient had completed 6 of 10 days of antibiotics for a left pneumonia.  #3 -  ADULT ONSET DIABETES MELLITUS:  The patient has steroid induced  hyperglycemia secondary to steroids, and was improving with a steroid taper.  The patient was discharged home with a sliding scale insulin coverage.   DISCHARGE LABORATORY DATA:  Hemoglobin 13.1.  LFTs normal.  Blood cultures  were negative x2.  Urine culture was unremarkable.  Urinalysis was negative.   DISCHARGE MEDICATIONS:  1. Prednisone 20 mg taper.  2. Regular insulin per sliding scale.  3. Ceftin 250 mg b.i.d. for six more days.  4. Combivent meter dosed inhaler two puffs q.i.d.  5. Albuterol p.r.n.  6. He was instructed to resume his other home medications.   FOLLOWUP:  He had followup arranged for Tuesday, July 05, 2003.      Cornell Barman, P.A. [facility]                  Rene Paci, M.D. [facility]    LC/MEDQ  D:  08/29/2003  T:  08/29/2003  Job:  [id number]   cc:   Gordy Savers, M.D. [facility]

## 2010-10-19 NOTE — H&P (Signed)
NAME:  [name], [name] NO.:  [id number]   MEDICAL RECORD NO.:  [id number]                   PATIENT TYPE:  EMS   LOCATION:  MAJO                                 FACILITY:  [facility]   PHYSICIAN:  [name], M.D. [facility]             DATE OF BIRTH:  [birth year]   DATE OF ADMISSION:  06/24/2003  DATE OF DISCHARGE:                                HISTORY & PHYSICAL   CHIEF COMPLAINT:  Sent from urgent care with left pneumonia on chest x-ray  and consistent symptoms.   HISTORY OF PRESENT ILLNESS:  [name] is a 60 year old black male who was  sent here after being found to be hypoxic and tachypneic after 2 nebulizers  and oxygen in the ER.  He complains of 3-4 days of increasing productive  cough, actually somewhat less today, over-the-counter medication not helping  but with obviously worsening wheezing and shortness of breath this evening,  prompting him to seek urgent care because Dr. Vernon Neal office was  closing at 4:30 in the afternoon.  There was no fever and with several  chills.  He describes diffuse myalgias, sore throat, head congestion.   PAST MEDICAL HISTORY:  1. Onset of labile hypertension.  2. Diabetes mellitus.  3. Peripheral vascular disease.  4. History of CVA April 2004, right frontoparietal with left upper extremity     weakness.  5. History of lumbar disk disease.  6. Dyslipidemia.  7. Chronic smoker.  8. History of Hodgkin's lymphoma, status post chemotherapy in 2000 per Dr.     Truett Neal.  9. Questionable history of chemotherapy-induced cardiomyopathy, ejection     fraction of 55-65% by 2-D echo March 2004.   SURGERIES:  None.   ALLERGIES:  CODEINE.   CURRENT MEDICATIONS:  1. Ecotrin 325 mg p.o. daily.  2. Lipitor 10 mg p.o. daily.  3. Actos 45 mg p.o. daily.  4. Lasix 40 mg p.o. daily.  5. Metformin 1000 mg p.o. b.i.d.  6. Prandin 2 mg 3 times daily.  7. Metoprolol 100 mg p.o. daily.  8. Monopril 20 mg p.o. daily.  9.  Norvasc 10 mg p.o. daily.  10.      KCl 20 mEq p.o. daily.  11.      Restoril 15 mg q.h.s.  12.      Multivitamin daily.   SOCIAL HISTORY:  Tobacco:  One-half pack per day.  Alcohol:  Rare.  He is a  retired Psychologist, educational.   FAMILY HISTORY:  Negative for diabetes, stroke.   REVIEW OF SYSTEMS:  Otherwise noncontributory.   PHYSICAL EXAMINATION:  GENERAL:  This is a 60 year old black male.  VITAL SIGNS:  Blood pressure 154/72, respiratory rate 32, heart rate 92, O2  saturation 94% on room air, temperature 100.5.  ENT:  Sclerae clear.  TMs clear.  Pharynx with mild erythema.  NECK:  Without lymphadenopathy, JVD, thyromegaly.  CHEST:  Few left lower lobe  rales and bilateral wheezes.  CARDIAC:  Regular rate and rhythm.  ABDOMEN:  Soft and nontender.  Positive bowel sounds.  No organomegaly or  masses.  EXTREMITIES:  No edema.  NEUROLOGIC:  Noncontributory.   LABORATORIES:  Chest x-ray from urgent care with what seems like a probable  left lingular infiltrate.   ASSESSMENT AND PLAN:  1. Left-sided pneumonia with wheezing:  He is to be admitted, given IV     steroids, antibiotics, nebulizer treatments, pulmonary toilet, O2 as     needed.  Will check blood cultures and routine laboratories otherwise and     follow clinically.  2. Diabetes mellitus:  Check CBGs and __________.  Add sliding scale     insulin.  3. Hypertension:  Labile in the past.  Continue on medications at this time.  4. Other medical problems:  Otherwise stable.  Continue medications as is.  5. Disposition:  Hopefully will return to home when improved.  6. He is currently a full-code status.                                                [name], M.D. [facility]    JWJ/MEDQ  D:  06/24/2003  T:  06/25/2003  Job:  [id number]   cc:   [name], M.D. [facility]

## 2010-10-19 NOTE — Discharge Summary (Signed)
[name], [name]                          ACCOUNT NO.:  [id number]   MEDICAL RECORD NO.:  [id number]                   PATIENT TYPE:  INP   LOCATION:  3030                                 FACILITY:  [facility]   PHYSICIAN:  Rene Paci, M.D. [facility]          DATE OF BIRTH:  07-22-1950   DATE OF ADMISSION:  09/04/2002  DATE OF DISCHARGE:  09/08/2002                                 DISCHARGE SUMMARY   DISCHARGE DIAGNOSES:  1. Acute right frontoparietal infarct.  2. 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  3. Left upper extremity hemiparesis.  4. Back pain.  5. Herniated disk at L4-5 and L5-S1.  6. Hypertriglyceridemia.  7. Hypertension.   BRIEF ADMISSION HISTORY:  The patient is a 60 year old African-American male  who developed a left facial droop and weakness of the left upper extremity  on September 03, 2002.  The patient's symptoms progressed including slurred  speech.  The patient did not immediately seek medical assistance.  He  presented to the emergency room with a right CVA.   PAST MEDICAL HISTORY:  1. Hypertension.  2. History of Hodgkin's lymphoma in childhood.  3. Adult onset diabetes mellitus.  4. Tobacco use.   HOSPITAL COURSE:  1. Neurologic.  The patient presented with evidence of a right CVA.  The     patient was seen in consultation by neurology.  They recommended an     MRI/MRA, carotid Dopplers and a 2D echo.  They also recommended starting     heparin empirically as well as an aspirin.  The patient had an MRI of the     brain.  This did reveal an acute infarction in the right frontoparietal     white matter.  He also had an MRA that revealed severe stenosis in the     proximal right posterior cerebral artery.  Carotid Dopplers were negative     for ICA stenosis.  A 2D echo was negative for embolus.  Once his echo was     negative for embolus his heparin was discontinued.  At this time he will     need aspirin but not Plavix. The  patient has been instructed to quit     smoking.  2. Dyslipidemia.  The patient's cholesterol was normal but his triglycerides     were elevated.  The patient has been started on Crestor.  3. Adult onset diabetes mellitus.  This has been fairly stable during this     hospitalization.  Hemoglobin A1c was normal indicating good outpatient     glycemic control.  4. Back pain.  This prompted an MRI of the patient's lumbar spine.  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  There was disk     degeneration and bulging at L5-S1.  There was also disk bulging and mild     facet atrophy at L4-5.  We did consult radiology for an epidural steroid     injection.  Because of the holiday on September 10, 2002, we will not be able     to do this until September 13, 2002.  Arrangements have been made for the     patient to follow-up for his epidural steroid injection as an outpatient.  5. Hypertension.  The patient's blood pressures have been fairly labile and     difficult to control.  He is on multiple medications.  While here we did     order an MRA of his renal arteries to rule out renal artery stenosis.     That test is still pending, but will be reviewed prior to his discharge.   LABORATORY DATA:  Homocysteine level was 13.21.  CBC was normal.  Coagulations were normal.  CMET was essentially normal.  Hemoglobin A1c was  6.1%. Total cholesterol 157, triglycerides 240, HDL 35, LDL 74.   MRA of the renal arteries was normal with no renal artery stenosis.   DISCHARGE MEDICATIONS:  1. Aspirin 325 mg daily.  He is instructed to hold this on September 11, 2002     through September 13, 2002 for his epidural steroid injection.  2. Monopril 20 mg b.i.d.  3. Lasix 40 mg daily.  4. Potassium chloride 20 mEq daily.  5. Lopressor 100 mg b.i.d.  6. Actos 15 mg daily.  7. Prandin 2 mg t.i.d. with meals.  8. Hytrin 5 mg b.i.d.  9. Norvasc  10 mg daily.  10.      Crestor 10 mg daily.  11.      Glucophage 1 gram daily.  12.      Vioxx 25 mg daily for seven days.  13.      Vicodin 5/500 mg one tablet q.6h p.r.n.   FOLLOW UP:  1. He is to follow-up for outpatient PT and OT.  Nothing to eat after     midnight on September 12, 2002 for his steroid injection.  2. The patient is to see Dr. Gordy Savers in two to three weeks.  3. The patient is to see Dr. Sandria Manly in three to four weeks.  4. The patient is to follow-up at [facility] for an epidural     steroid injection on September 13, 2002.  He is to follow-up at admitting at     2:30 p.m.     Cornell Barman, P.A. [facility]                  Rene Paci, M.D. [facility]    LC/MEDQ  D:  09/08/2002  T:  09/09/2002  Job:  [id number]   cc:   Gordy Savers, M.D. [facility]   Genene Churn. Love, M.D.  [address]  [address]  [place]  [place] [address]  Fax: [phone]

## 2010-10-19 NOTE — Discharge Summary (Signed)
[name], BRAKEBILL                ACCOUNT NO.:  [id number]   MEDICAL RECORD NO.:  [id number]          PATIENT TYPE:  INP   LOCATION:  4729                         FACILITY:  [facility]   PHYSICIAN:  Georgina Quint. Plotnikov, MDDATE OF BIRTH:  Dec 05, 1950   DATE OF ADMISSION:  07/15/2006  DATE OF DISCHARGE:  07/26/2006                               DISCHARGE SUMMARY   DISCHARGE DIAGNOSES:  1. Left lower lobe pneumonia with effusion.  2. Pseudomonas luteola bacteremia.  3. Type 2 diabetes.  4. Hypertension.  5. Chronic systolic congestive heart failure.  6. History of right-sided cerebrovascular accident in 2004.  7. Dyslipidemia.   DISCHARGE MEDICATIONS:  1. Cipro 500 p.o. b.i.d. for 10 days.  2. Robitussin AC 5 to 10 mL q.4h. p.r.n.  3. Resume other home medicines.   SPECIAL INSTRUCTIONS:  Call with problems, improved diabetic diet.   FOLLOWUP PLANS:  Dr. Amador Cunas in one week.   HISTORY:  The patient is a 60 year old diabetic male who is admitted  with cough and weakness by Dr. Amador Cunas on July 15, 2006.  For  the details, please address the history and physical.   HOSPITAL COURSE:  During the course of hospitalization, the patient was  found to have positive blood cultures which required prolonged IV  antibiotics.  Overall, he has been doing progressively better, feeling  stronger, continues to have some productive cough which is getting  progressively better.   On the day of discharge, he is feeling well, denies  chest pain or  shortness of breath, no chills or fever.  His temperature is 97.6, heart  rate 89, respirations 17, blood pressure 134/81, saturations 93% on room  air.  Blood sugars 152-74-260.  He is in no acute distress.  HEENT:  Moist.  LUNGS:  Clear, no wheezes or rales.  HEART:  S1, S2, no gallop,  no murmur.  ABDOMEN:  Soft, nontender, no organomegaly, or mass felt.  EXTREMITIES:  No edema.  He is alert, oriented, and cooperative.   LABORATORY WORK:   Blood culture was positive for Pseudomonas luteola.  Hemoglobin 12.2, white count 10.7, INR 1.2, potassium 4.4, BUN 17,  creatinine 1.16.   CT angiography of the chest on July 21, 2006 with no evidence of PE,  opacity throughout much of the left lower lobe consistent with  pneumonia, small pleural effusion.      Georgina Quint. Plotnikov, MD  Electronically Signed     AVP/MEDQ  D:  07/26/2006  T:  07/27/2006  Job:  [id number]   cc:   Gordy Savers, MD

## 2010-10-19 NOTE — H&P (Signed)
[name], Neal NO.:  [id number]   MEDICAL RECORD NO.:  [id number]          PATIENT TYPE:  INP   LOCATION:  4729                         FACILITY:  [facility]   PHYSICIAN:  Gordy Savers, MDDATE OF BIRTH:  09/09/1950   DATE OF ADMISSION:  07/15/2006  DATE OF DISCHARGE:                              HISTORY & PHYSICAL   CHIEF COMPLAINT:  Cough and weakness.   PRESENT ILLNESS:  The patient is a 60 year old African-American male who  was stable until about 10 days ago at which time he developed URI  symptoms.  He was treated symptomatically with Duraflu and was seen in  the office on the day of admission complaining of increasing weakness,  chest congestion and minimally productive cough.  For the past several  days, he has weakened with anorexia and poor oral intake. He also  complained of some a discomfort involving the left chest wall area.  Office evaluation revealed temperature of 100.6. O2 saturation was 81%.  Random blood sugar was 468.  He is now admitted for further evaluation  and treatment of extensive left-sided pneumonia and secondary hypoxic  respiratory failure.   PAST MEDICAL HISTORY:  1. The patient was hospitalized in January 2005 for pneumonia and      respiratory insufficiency at that time.  2. He has a history of remote tobacco use but not in several years.      Additional diagnoses include:  3. Diabetes mellitus.  4. Hypertension.  5. In 2000 he was diagnosed with stage 4B Hodgkin's disease and was      treated with aggressive chemotherapy by Dr. Truett Perna.  6. 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 In 2002, a Cardiolite stress test revealed no      perfusion abnormalities but ejection fraction of 42%. A 2-D      echocardiogram in 2002 also revealed an ejection fraction of 35-      45%.  7. In April 2004, the patient had a right brain subcortical stroke and      presented with some dysarthria  and left arm weakness. A 2-D      echocardiogram at that time revealed a normal ejection fraction of      55-65%.   REVIEW OF SYSTEMS:  Exam is otherwise fairly noncontributory except as  mentioned in History of Present Illness.  He had a negative screening  colonoscopy in 2003. His last hemoglobin A1c was in December 2007 and  was 6.7. In the spring of last year, it was 6.4.   PRESENT MEDICAL REGIMEN:  1. Aspirin 325 daily.  2. Prandin 2 mg 3 times a day before meals or food.  3. Monopril 20 mg b.i.d.  4. Potassium chloride.  5. Hytrin 5 mg daily.  6. Caduet 10/10 daily.  7. Metformin 1 g b.i.d.  8. Metoprolol 50 mg daily.  9. Amaryl 4 mg daily.  10.Januvia 100 mg daily.   FAMILY HISTORY:  Positive cardiac and cerebrovascular disease as well as  diabetes.  One brother had colonic polyps.  One sister died of lung  cancer.  He is 1 of 15 siblings.   PHYSICAL EXAMINATION:  GENERAL:  Well-developed male who appeared ill  but no acute distress.  General exam revealed him to be weak and  diaphoretic.  VITAL SIGNS: Temperature is 100.6, O2 saturation 81-82%.  HEAD AND NECK:  Revealed normal pupillary responses.  Conjunctiva clear.  ENT unremarkable.  Neck revealed no bruits or adenopathy.  CHEST: Examination of left chest revealed extensive rales involving the  left mid and lower lung fields.  CARDIOVASCULAR: Exam revealed a resting tachycardia at rate of 104. No  murmur appreciated.  ABDOMEN: Soft, nontender.  No organomegaly.  EXTREMITIES:  Negative with intact peripheral pulses   IMPRESSION:  1. Extensive left-sided community-acquired pneumonia with hypoxic      respiratory failure.  2. Uncontrolled diabetes.   ADDITIONAL DIAGNOSIS:  History of stage 4B Hodgkin's disease in 2000.   DISPOSITION:  1. The patient will be admitted to hospital.  2. Laboratory screening, chest x-ray, blood cultures will be obtained.  3. He will be placed on Rocephin and azithromycin.  4.  Pulmonary toilet will be instituted.  5. Blood sugars be monitored, and he will be covered with a sliding      scale regimen of short-acting insulin.  He also be placed on      maintenance insulin and his oral regimen held.  6. He will be supported with IV fluids and oxygen therapy.      Gordy Savers, MD  Electronically Signed     PFK/MEDQ  D:  07/15/2006  T:  07/15/2006  Job:  [id number]

## 2010-10-19 NOTE — Assessment & Plan Note (Signed)
[facility]                            [facility] OFFICE NOTE   EZRAH, PANNING                       MRN:          [id number]  DATE:04/17/2006                            DOB:          September 04, 1950    A 60 year old gentleman who is seen today in follow up.  Last week he  developed a URI with head and chest congestion.  He worsened over the  weekend, was seen at Urgent Care 5 days ago.  Chest x-ray was negative  and he was placed on albuterol and doxycycline.  He is seen today for  follow up feeling improved.  He does have some mild cough productive of  white sputum, has not noted any wheezing.  He does complain of some  fullness and indigestion type symptoms that are nonexertional.  He does  have a history of diabetes, hypertension and a history of  cerebrovascular disease.  He underwent cardiac evaluation in 2002 that  revealed a normal Cardiolite stress test without perfusion  abnormalities.  A 2D echocardiogram in the past has revealed a  moderately reduced ejection fraction.   PHYSICAL EXAMINATION:  Exam today revealed him to be in no acute  distress.  Blood pressure was 130/82, temperature afebrile, O2  saturation 92-93%, pulse rate 82.  Skin warm and dry without rash.  CHEST:  Generally diminished breath sounds, a few scattered rhonchi  noted, there is no audible wheezing.  CARDIOVASCULAR:  A regular rhythm.  ABDOMEN:  Obese, soft and nontender, no distention.  EXTREMITIES:  Negative.   IMPRESSION:  1. Resolving bronchitis.  2. History of pneumonia.  3. History of left ventricular dysfunction.   DISPOSITION:  He will complete his doxycycline and continue on his  expectorants.  He will return if there is any clinical worsening.  An  EKG will be reviewed.     Gordy Savers, MD  Electronically Signed    PFK/MedQ  DD: 04/17/2006  DT: 04/17/2006  Job #: [id number]

## 2010-11-01 ENCOUNTER — Other Ambulatory Visit (INDEPENDENT_AMBULATORY_CARE_PROVIDER_SITE_OTHER): Payer: 59

## 2010-11-01 DIAGNOSIS — Z0000 Encounter for general adult medical examination without abnormal findings: Secondary | ICD-10-CM

## 2010-11-01 LAB — POCT URINALYSIS DIPSTICK
Bilirubin, UA: NEGATIVE
Blood, UA: NEGATIVE
Leukocytes, UA: NEGATIVE
pH, UA: 5

## 2010-11-01 LAB — CBC WITH DIFFERENTIAL/PLATELET
Basophils Absolute: 0 10*3/uL (ref 0.0–0.1)
HCT: 41.6 % (ref 39.0–52.0)
Hemoglobin: 13.8 g/dL (ref 13.0–17.0)
Lymphs Abs: 1.9 10*3/uL (ref 0.7–4.0)
MCV: 98.3 fl (ref 78.0–100.0)
Monocytes Absolute: 0.5 10*3/uL (ref 0.1–1.0)
Monocytes Relative: 9.7 % (ref 3.0–12.0)
Neutro Abs: 2.8 10*3/uL (ref 1.4–7.7)
Platelets: 269 10*3/uL (ref 150.0–400.0)
RDW: 13.6 % (ref 11.5–14.6)

## 2010-11-01 LAB — HEPATIC FUNCTION PANEL
AST: 20 U/L (ref 0–37)
Alkaline Phosphatase: 65 U/L (ref 39–117)
Bilirubin, Direct: 0.3 mg/dL (ref 0.0–0.3)
Total Protein: 6.3 g/dL (ref 6.0–8.3)

## 2010-11-01 LAB — BASIC METABOLIC PANEL
BUN: 12 mg/dL (ref 6–23)
CO2: 27 mEq/L (ref 19–32)
Chloride: 107 mEq/L (ref 96–112)
GFR: 133.48 mL/min (ref >60.00)
Glucose, Bld: 160 mg/dL — ABNORMAL HIGH (ref 70–99)
Potassium: 3.9 mEq/L (ref 3.5–5.1)
Sodium: 142 mEq/L (ref 135–145)

## 2010-11-01 LAB — MICROALBUMIN / CREATININE URINE RATIO
Creatinine,U: 122.2 mg/dL
Microalb Creat Ratio: 44.7 mg/g — ABNORMAL HIGH (ref 0.0–30.0)

## 2010-11-01 LAB — LIPID PANEL
Total CHOL/HDL Ratio: 3
VLDL: 41.4 mg/dL — ABNORMAL HIGH (ref 0.0–40.0)

## 2010-11-03 ENCOUNTER — Other Ambulatory Visit: Payer: Self-pay | Admitting: Internal Medicine

## 2010-11-07 ENCOUNTER — Other Ambulatory Visit: Payer: Self-pay | Admitting: Internal Medicine

## 2010-11-12 ENCOUNTER — Other Ambulatory Visit: Payer: Self-pay | Admitting: Internal Medicine

## 2010-11-12 ENCOUNTER — Ambulatory Visit (INDEPENDENT_AMBULATORY_CARE_PROVIDER_SITE_OTHER): Payer: 59 | Admitting: Internal Medicine

## 2010-11-12 ENCOUNTER — Encounter: Payer: Self-pay | Admitting: Internal Medicine

## 2010-11-12 DIAGNOSIS — Z0000 Encounter for general adult medical examination without abnormal findings: Secondary | ICD-10-CM

## 2010-11-12 DIAGNOSIS — E785 Hyperlipidemia, unspecified: Secondary | ICD-10-CM

## 2010-11-12 DIAGNOSIS — Z23 Encounter for immunization: Secondary | ICD-10-CM

## 2010-11-12 DIAGNOSIS — I509 Heart failure, unspecified: Secondary | ICD-10-CM

## 2010-11-12 DIAGNOSIS — I10 Essential (primary) hypertension: Secondary | ICD-10-CM

## 2010-11-12 DIAGNOSIS — E119 Type 2 diabetes mellitus without complications: Secondary | ICD-10-CM

## 2010-11-12 MED ORDER — EXENATIDE ER 2 MG ~~LOC~~ SUSR: 1.0000 | SUBCUTANEOUS | Status: DC

## 2010-11-12 NOTE — Progress Notes (Signed)
Subjective:    Patient ID: [name], male    DOB: 12-May-1951, 60 y.o.   MRN: [id number]  HPI   History of Present Illness:  60 year-old who is seen today for a comprehensive evaluation. He has a remote history of lymphoma that has been in remission. He has type 2 diabetes, history of congestive heart failure, hypertension, and dyslipidemia. He has cerebrovascular disease. Clinically, he does quite well and denies any cardiopulmonary or focal neurological symptoms.  His past two hemoglobin A1c's have been 7.9. He states more recently he has felt his glycemic control has improved. He has been compliant with his twice-daily injections and his oral medications. Preventive Screening-Counseling & Management  Alcohol-Tobacco  Smoking Status: quit  Allergies:  1) Codeine Phosphate (Codeine Phosphate)  Past History:  Past Medical History:  Reviewed history from 12/18/2007 and no changes required.  Hodgkin's lymphoma stage IV B. status post ABVD chemotherapy-2000  Diabetes mellitus, type II  Hypertension  left ventricular dysfunction -chemotherapy-induced (2-D echocardiogram 42%)  Cerebrovascular accident, hx of right brain subcortical stroke  Hyperlipidemia  history of left S1 radiculopathy  pneumonia 2005, February 2008  Past Surgical History:  Reviewed history from 12/18/2007 and no changes required.  lymph node biopsy  heart catheterization June 2002  colonoscopy 2003  2-D echocardiogram-April 2002 ejection fraction 55 to 65%  Family History:  Reviewed history from 02/10/2007 and no changes required.  father died 72, congestive heart failure  mother died age 35, diabetes, coronary disease and  some brothers 7 sisters positive for colonic polyps. Lung cancer;  sister- Ca ? type probable lung ca; brother died probable CAD  Social History:  Reviewed history from 06/21/2008 and no changes required.  Married  presently his exercise regularly at local [employer]     Review of Systems    Constitutional: Negative for fever, chills, activity change, appetite change and fatigue.  HENT: Negative for hearing loss, ear pain, congestion, rhinorrhea, sneezing, mouth sores, trouble swallowing, neck pain, neck stiffness, dental problem, voice change, sinus pressure and tinnitus.   Eyes: Negative for photophobia, pain, redness and visual disturbance.       [Recent eye exam 2 weeks ago with Dr. Mitzi Davenport Respiratory: Negative for apnea, cough, choking, chest tightness, shortness of breath and wheezing.   Cardiovascular: Negative for chest pain, palpitations and leg swelling.  Gastrointestinal: Negative for nausea, vomiting, abdominal pain, diarrhea, constipation, blood in stool, abdominal distention, anal bleeding and rectal pain.  Genitourinary: Negative for dysuria, urgency, frequency, hematuria, flank pain, decreased urine volume, discharge, penile swelling, scrotal swelling, difficulty urinating, genital sores and testicular pain.  Musculoskeletal: Negative for myalgias, back pain, joint swelling, arthralgias and gait problem.  Skin: Negative for color change, rash and wound.  Neurological: Negative for dizziness, tremors, seizures, syncope, facial asymmetry, speech difficulty, weakness, light-headedness, numbness and headaches.  Hematological: Negative for adenopathy. Does not bruise/bleed easily.  Psychiatric/Behavioral: Negative for suicidal ideas, hallucinations, behavioral problems, confusion, sleep disturbance, self-injury, dysphoric mood, decreased concentration and agitation. The patient is not nervous/anxious.        Objective:   Physical Exam  Constitutional: He appears well-developed and well-nourished.  HENT:  Head: Normocephalic and atraumatic.  Right Ear: External ear normal.  Left Ear: External ear normal.  Nose: Nose normal.  Mouth/Throat: Oropharynx is clear and moist.  Eyes: Conjunctivae and EOM are normal. Pupils are equal, round, and reactive to light. No  scleral icterus.  Neck: Normal range of motion. Neck supple. No JVD present. No thyromegaly present.  Cardiovascular: Regular rhythm, normal heart sounds and intact distal pulses.  Exam reveals no gallop and no friction rub.   No murmur heard. Pulmonary/Chest: Effort normal. He has rales. He exhibits no tenderness.       A few bibasilar rales  Abdominal: Soft. Bowel sounds are normal. He exhibits no distension and no mass. There is no tenderness.  Genitourinary: Prostate normal and penis normal.       Prostate minimally symmetrically enlarged  Musculoskeletal: Normal range of motion. He exhibits no edema and no tenderness.  Lymphadenopathy:    He has no cervical adenopathy.  Neurological: He is alert. He has normal reflexes. No cranial nerve deficit. Coordination normal.  Skin: Skin is warm and dry. No rash noted.  Psychiatric: He has a normal mood and affect. His behavior is normal.          Assessment & Plan:   Annual health assessment Diabetes mellitus suboptimal control. We'll switch over to Bydureon a d samples were dispensed. Will continue if his insurance plan covers. Nonpharmacologic measures discussed at length Hypertension stable

## 2010-11-12 NOTE — Patient Instructions (Signed)
Please check your hemoglobin A1c every 3 months  You need to lose weight.  Consider a lower calorie diet and regular exercise.    It is important that you exercise regularly, at least 20 minutes 3 to 4 times per week.  If you develop chest pain or shortness of breath seek  medical attention. 

## 2010-12-03 ENCOUNTER — Other Ambulatory Visit: Payer: Self-pay | Admitting: Internal Medicine

## 2010-12-06 ENCOUNTER — Other Ambulatory Visit: Payer: Self-pay | Admitting: Internal Medicine

## 2010-12-10 ENCOUNTER — Other Ambulatory Visit: Payer: Self-pay | Admitting: Internal Medicine

## 2011-01-07 ENCOUNTER — Other Ambulatory Visit: Payer: Self-pay | Admitting: Internal Medicine

## 2011-01-24 ENCOUNTER — Other Ambulatory Visit: Payer: Self-pay | Admitting: Internal Medicine

## 2011-02-05 ENCOUNTER — Other Ambulatory Visit: Payer: Self-pay | Admitting: Internal Medicine

## 2011-02-08 ENCOUNTER — Encounter: Payer: Self-pay | Admitting: Internal Medicine

## 2011-02-11 ENCOUNTER — Encounter: Payer: Self-pay | Admitting: Internal Medicine

## 2011-02-11 ENCOUNTER — Ambulatory Visit (INDEPENDENT_AMBULATORY_CARE_PROVIDER_SITE_OTHER): Payer: 59 | Admitting: Internal Medicine

## 2011-02-11 VITALS — BP 120/72 | Temp 97.8°F | Wt 165.0 lb

## 2011-02-11 DIAGNOSIS — I10 Essential (primary) hypertension: Secondary | ICD-10-CM

## 2011-02-11 DIAGNOSIS — Z0000 Encounter for general adult medical examination without abnormal findings: Secondary | ICD-10-CM

## 2011-02-11 DIAGNOSIS — Z23 Encounter for immunization: Secondary | ICD-10-CM

## 2011-02-11 DIAGNOSIS — E785 Hyperlipidemia, unspecified: Secondary | ICD-10-CM

## 2011-02-11 DIAGNOSIS — I509 Heart failure, unspecified: Secondary | ICD-10-CM

## 2011-02-11 DIAGNOSIS — E119 Type 2 diabetes mellitus without complications: Secondary | ICD-10-CM

## 2011-02-11 NOTE — Progress Notes (Signed)
  Subjective:    Patient ID: [name], male    DOB: 1950-06-07, 60 y.o.   MRN: [id number]  HPI  Wt Readings from Last 3 Encounters:  02/11/11 165 lb (74.844 kg)  11/12/10 170 lb (77.111 kg)  07/19/10 171 lb (77.565 kg)      Review of Systems     Objective:   Physical Exam        Assessment & Plan:

## 2011-02-11 NOTE — Progress Notes (Signed)
  Subjective:    Patient ID: [name], male    DOB: 04/18/1951, 60 y.o.   MRN: [id number]  HPI  60 year old patient who is seen today for followup. He has type 2 diabetes. His last hemoglobin A1c is had been 7.9. Weekly Bydureon was added to his regimen 3 months ago. Prior to this she was on  Byetta. He is tolerating medication well. He has a history of congestive heart failure and remains asymptomatic. There's been some modest weight loss. He feels well. He has treated hypertension and dyslipidemia    Review of Systems  Constitutional: Negative for fever, chills, appetite change and fatigue.  HENT: Negative for hearing loss, ear pain, congestion, sore throat, trouble swallowing, neck stiffness, dental problem, voice change and tinnitus.   Eyes: Negative for pain, discharge and visual disturbance.  Respiratory: Negative for cough, chest tightness, wheezing and stridor.   Cardiovascular: Negative for chest pain, palpitations and leg swelling.  Gastrointestinal: Negative for nausea, vomiting, abdominal pain, diarrhea, constipation, blood in stool and abdominal distention.  Genitourinary: Negative for urgency, hematuria, flank pain, discharge, difficulty urinating and genital sores.  Musculoskeletal: Negative for myalgias, back pain, joint swelling, arthralgias and gait problem.  Skin: Negative for rash.  Neurological: Negative for dizziness, syncope, speech difficulty, weakness, numbness and headaches.  Hematological: Negative for adenopathy. Does not bruise/bleed easily.  Psychiatric/Behavioral: Negative for behavioral problems and dysphoric mood. The patient is not nervous/anxious.        Objective:   Physical Exam  Constitutional: He is oriented to person, place, and time. He appears well-developed.  HENT:  Head: Normocephalic.  Right Ear: External ear normal.  Left Ear: External ear normal.  Eyes: Conjunctivae and EOM are normal.  Neck: Normal range of motion.  Cardiovascular:  Normal rate and normal heart sounds.   Pulmonary/Chest: Effort normal. No respiratory distress. He has no wheezes.       Bibasilar rales noted O2 saturation 96-98%  Abdominal: Bowel sounds are normal.  Musculoskeletal: Normal range of motion. He exhibits no edema and no tenderness.  Neurological: He is alert and oriented to person, place, and time.  Psychiatric: He has a normal mood and affect. His behavior is normal.          Assessment & Plan:    Diabetes mellitus. We'll check a hemoglobin A1c. Ongoing exercise weight loss encouraged Hypertension stable History congestive heart failure

## 2011-02-11 NOTE — Patient Instructions (Signed)
It is important that you exercise regularly, at least 20 minutes 3 to 4 times per week.  If you develop chest pain or shortness of breath seek  medical attention.  You need to lose weight.  Consider a lower calorie diet and regular exercise.   Please check your hemoglobin A1c every 3 months   

## 2011-02-13 NOTE — Progress Notes (Signed)
Quick Note:  Spoke with pt - informed of lab results ______

## 2011-05-13 ENCOUNTER — Encounter: Payer: Self-pay | Admitting: Internal Medicine

## 2011-05-13 ENCOUNTER — Ambulatory Visit (INDEPENDENT_AMBULATORY_CARE_PROVIDER_SITE_OTHER): Payer: 59 | Admitting: Internal Medicine

## 2011-05-13 DIAGNOSIS — E119 Type 2 diabetes mellitus without complications: Secondary | ICD-10-CM

## 2011-05-13 DIAGNOSIS — E785 Hyperlipidemia, unspecified: Secondary | ICD-10-CM

## 2011-05-13 DIAGNOSIS — I509 Heart failure, unspecified: Secondary | ICD-10-CM

## 2011-05-13 DIAGNOSIS — I10 Essential (primary) hypertension: Secondary | ICD-10-CM

## 2011-05-13 LAB — HEMOGLOBIN A1C: Hgb A1c MFr Bld: 7 % — ABNORMAL HIGH (ref 4.6–6.5)

## 2011-05-13 NOTE — Progress Notes (Signed)
  Subjective:    Patient ID: [name], male    DOB: 29-May-1951, 60 y.o.   MRN: [id number]  HPI  60 year old patient who is in today for follow up of his type 2 diabetes. 6 months ago he was placed on bi-durian and his hemoglobin A1c 3 months ago was improved to 7.0. He had been at 7.9 the last 2 determinations. He feels well and states fasting blood sugars are down to less than 100. There's been some modest weight gain of a few pounds since his last visit here He has treated hypertension which has been quite well. He also has a history congestive heart failure that has been stable. He has treated dyslipidemia and remains on Lipitor    Review of Systems  Constitutional: Negative for fever, chills, appetite change and fatigue.  HENT: Negative for hearing loss, ear pain, congestion, sore throat, trouble swallowing, neck stiffness, dental problem, voice change and tinnitus.   Eyes: Negative for pain, discharge and visual disturbance.  Respiratory: Negative for cough, chest tightness, wheezing and stridor.   Cardiovascular: Negative for chest pain, palpitations and leg swelling.  Gastrointestinal: Negative for nausea, vomiting, abdominal pain, diarrhea, constipation, blood in stool and abdominal distention.  Genitourinary: Negative for urgency, hematuria, flank pain, discharge, difficulty urinating and genital sores.  Musculoskeletal: Negative for myalgias, back pain, joint swelling, arthralgias and gait problem.  Skin: Negative for rash.  Neurological: Negative for dizziness, syncope, speech difficulty, weakness, numbness and headaches.  Hematological: Negative for adenopathy. Does not bruise/bleed easily.  Psychiatric/Behavioral: Negative for behavioral problems and dysphoric mood. The patient is not nervous/anxious.        Objective:   Physical Exam  Constitutional: He is oriented to person, place, and time. He appears well-developed.  HENT:  Head: Normocephalic.  Right Ear: External  ear normal.  Left Ear: External ear normal.  Eyes: Conjunctivae and EOM are normal.  Neck: Normal range of motion.  Cardiovascular: Normal rate and normal heart sounds.   Pulmonary/Chest: Breath sounds normal.       A few basilar crackles  Abdominal: Bowel sounds are normal.  Musculoskeletal: Normal range of motion. He exhibits no edema and no tenderness.  Neurological: He is alert and oriented to person, place, and time.  Psychiatric: He has a normal mood and affect. His behavior is normal.          Assessment & Plan:    Diabetes mellitus. We'll check a hemoglobin A1c Hypertension stable Dyslipidemia  We'll continue efforts at exercise weight loss

## 2011-05-13 NOTE — Progress Notes (Signed)
Quick Note:  Spoke with pt- informed of lab ______

## 2011-05-13 NOTE — Progress Notes (Signed)
  Subjective:    Patient ID: [name], male    DOB: [birth year], 60 y.o.   MRN: [id number]  HPI   Wt Readings from Last 3 Encounters:  05/13/11 168 lb (76.204 kg)  02/11/11 165 lb (74.844 kg)  11/12/10 170 lb (77.111 kg)     Review of Systems     Objective:   Physical Exam        Assessment & Plan:

## 2011-05-13 NOTE — Patient Instructions (Signed)
Please check your hemoglobin A1c every 3 months    It is important that you exercise regularly, at least 20 minutes 3 to 4 times per week.  If you develop chest pain or shortness of breath seek  medical attention.  You need to lose weight.  Consider a lower calorie diet and regular exercise. 

## 2011-05-20 ENCOUNTER — Other Ambulatory Visit: Payer: Self-pay | Admitting: Ophthalmology

## 2011-05-20 NOTE — H&P (Signed)
Pre-operative History and Physical for Ophthalmic Surgery  ALEXIS MIZUNO 05/20/2011                  Chief Complaint: Decreased vision right eye  Diagnosis: Combined Cataract  Allergies  Allergen Reactions  . Codeine Phosphate     REACTION: unspecified     Prior to Admission medications   Medication Sig Start Date End Date Taking? Authorizing Provider  ACCU-CHEK AVIVA PLUS test strip USE DAILY AS DIRECTED 11/12/10   Rogelia Boga  amLODipine (NORVASC) 10 MG tablet Take 1 tablet (10 mg total) by mouth daily. 07/19/10 07/19/11  Rogelia Boga  aspirin 325 MG tablet Take 325 mg by mouth daily.      Historical Provider, MD  atorvastatin (LIPITOR) 10 MG tablet Take 1 tablet (10 mg total) by mouth daily. 07/19/10 07/19/11  Rogelia Boga  B-D UF III MINI PEN NEEDLES 31G X 5 MM MISC USE TWICE A DAY 08/24/10   Rogelia Boga  Exenatide (BYDUREON) 2 MG SUSR Inject 1 vial into the skin once a week. 11/12/10   Peter Charissa Bash  fexofenadine-pseudoephedrine (ALLEGRA-D) 60-120 MG per tablet Take 1 tablet by mouth.      Historical Provider, MD  fosinopril (MONOPRIL) 20 MG tablet TAKE 1 TABLET TWICE A DAY 01/07/11   Mirian Mo Kwiatkowski  glimepiride (AMARYL) 4 MG tablet TAKE 1 TABLET TWICE A DAY 11/03/10   Rogelia Boga  glucose blood (ACCU-CHEK COMFORT CURVE) test strip 1 each by Other route daily. Use as instructed     Historical Provider, MD  KLOR-CON M20 20 MEQ tablet TAKE 1 TABLET EVERY DAY 12/10/10   Rogelia Boga  Lancets (ACCU-CHEK MULTICLIX) lancets USE AS DIRECTED 02/05/11   Rogelia Boga  LORazepam (ATIVAN) 0.5 MG tablet Take 1 tablet (0.5 mg total) by mouth 2 (two) times daily as needed. 10/08/10   Rogelia Boga  metFORMIN (GLUCOPHAGE) 1000 MG tablet TAKE 1 TABLET TWICE A DAY 01/24/11   Rogelia Boga  metoprolol (LOPRESSOR) 50 MG tablet TAKE 1 TABLET EVERY DAY 12/03/10   Rogelia Boga  Multiple  Vitamins-Minerals (CENTRUM SILVER PO) Take 1 tablet by mouth daily.      Historical Provider, MD  Omega-3 Fatty Acids (FISH OIL) 300 MG CAPS Take by mouth daily.      Historical Provider, MD  temazepam (RESTORIL) 15 MG capsule Take 15 mg by mouth at bedtime.      Historical Provider, MD  terazosin (HYTRIN) 5 MG capsule TAKE ONE CAPSULE EVERY DAY 12/06/10   Rogelia Boga    Planned Procedure:Phacoemulsification, Posterior Chamber Intra-ocular Lens  There were no vitals filed for this visit.  Pulse: 80        Resp:  18  ROS: non-contributory  Past Medical History  Diagnosis Date  . Hodgkin's lymphoma     stage IV B status post ABCD chemotherapy -2000  . Diabetes mellitus type II   . Hypertension   . 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 CVA (cerebral vascular accident)     hx of right brain subcortical stroke  . Hyperlipidemia   . Radiculopathy     S1  . Pneumonia Feb 2008    Past Surgical History  Procedure Date  . Lymph node biospy   . Cardiac catheterization june 2012  . Colonoscopy 2003  . 2-d echocardiogram April 2002    ejection fraction 55 to 65%  History   Social History  . Marital Status: Married    Spouse Name: N/A    Number of Children: N/A  . Years of Education: N/A   Occupational History  . Not on file.   Social History Main Topics  . Smoking status: Former Smoker    Quit date: 06/03/2004  . Smokeless tobacco: Not on file  . Alcohol Use: Not on file  . Drug Use: Not on file  . Sexually Active: Not on file   Other Topics Concern  . Not on file   Social History Narrative  . No narrative on file     The following examination is for anesthesia clearance for minimally invasive Ophthalmic surgery. It is primarily to document heart and lung findings and is not intended to elucidate unknown general medical conditions inclusive of abdominal masses, lung lesions, etc.   General Constitution:   within normal limits    Alertness/Orientation:  Person, time place     yes   HEENT:  Eye Findings: Combined Cataract                   right eye  Neck: supple without masses   Chest/Lungs: clear to auscultation   Cardiac: Normal S1 and S2 without Murmur, S3 or S4   Neuro: non-focal   Impression: Visually Significant Cataract OD  Planned Procedure:  Phacoemulsification, Posterior Chamber Intraocular Lens     Shade Flood, MD

## 2011-05-25 ENCOUNTER — Telehealth: Payer: Self-pay | Admitting: Oncology

## 2011-05-25 NOTE — Telephone Encounter (Signed)
S/w the pt and he is aware of the r/s appts from 06/20/2011 to 06/28/2011@11 :00am due to the md's schedule.

## 2011-06-06 ENCOUNTER — Other Ambulatory Visit: Payer: Self-pay

## 2011-06-06 MED ORDER — LORAZEPAM 0.5 MG PO TABS: 0.5000 mg | ORAL_TABLET | Freq: Two times a day (BID) | ORAL | Status: DC | PRN

## 2011-06-10 ENCOUNTER — Ambulatory Visit (INDEPENDENT_AMBULATORY_CARE_PROVIDER_SITE_OTHER): Payer: 59

## 2011-06-10 DIAGNOSIS — J069 Acute upper respiratory infection, unspecified: Secondary | ICD-10-CM

## 2011-06-11 ENCOUNTER — Other Ambulatory Visit: Payer: Self-pay | Admitting: Internal Medicine

## 2011-06-12 ENCOUNTER — Encounter (HOSPITAL_COMMUNITY)
Admission: RE | Admit: 2011-06-12 | Discharge: 2011-06-12 | Disposition: A | Discharge status: Home / Self Care | Payer: 59 | Source: Ambulatory Visit | Attending: Anesthesiology | Admitting: Anesthesiology

## 2011-06-12 ENCOUNTER — Encounter (HOSPITAL_COMMUNITY): Payer: Self-pay

## 2011-06-12 ENCOUNTER — Encounter (HOSPITAL_COMMUNITY)
Admission: RE | Admit: 2011-06-12 | Discharge: 2011-06-12 | Disposition: A | Discharge status: Home / Self Care | Payer: 59 | Source: Ambulatory Visit | Attending: Ophthalmology | Admitting: Ophthalmology

## 2011-06-12 HISTORY — DX: Unspecified osteoarthritis, unspecified site: M19.90

## 2011-06-12 HISTORY — DX: Personal history of other diseases of the digestive system: Z87.19

## 2011-06-12 LAB — BASIC METABOLIC PANEL
Chloride: 99 mEq/L (ref 96–112)
GFR calc Af Amer: >90 mL/min (ref >90)
GFR calc non Af Amer: 89 mL/min — ABNORMAL LOW (ref >90)
Glucose, Bld: 267 mg/dL — ABNORMAL HIGH (ref 70–99)
Potassium: 4.6 mEq/L (ref 3.5–5.1)
Sodium: 140 mEq/L (ref 135–145)

## 2011-06-12 LAB — CBC
Hemoglobin: 13.9 g/dL (ref 13.0–17.0)
MCH: 32.3 pg (ref 26.0–34.0)
RBC: 4.31 MIL/uL (ref 4.22–5.81)

## 2011-06-12 NOTE — Pre-Procedure Instructions (Signed)
20 CHAS AXEL  06/12/2011   Your procedure is scheduled on: Wednesday, January 16th  Report to [facility] Short Stay Center at 6:30 AM.  Call this number if you have problems the morning of surgery: [phone]   Remember:   Do not eat food:After Midnight.  May have clear liquids: up to 4 Hours before arrival.  Clear liquids include soda, tea, black coffee, apple or grape juice, broth.  Take these medicines the morning of surgery with A SIP OF WATER: Amilodipine, Metropolol,Terazosin   Do not wear jewelry, make-up or nail polish.  Do not wear lotions, powders, or perfumes. You may wear deodorant.  Do not shave 48 hours prior to surgery.  Do not bring valuables to the hospital.  Contacts, dentures or bridgework may not be worn into surgery.  Leave suitcase in the car. After surgery it may be brought to your room.  For patients admitted to the hospital, checkout time is 11:00 AM the day of discharge.   Patients discharged the day of surgery will not be allowed to drive home.  Name and phone number of your driver: Winferd Wease [phone]  Special Instructions: CHG Shower Use Special Wash: 1/2 bottle night before surgery and 1/2 bottle morning of surgery.   Please read over the following fact sheets that you were given: Pain Booklet, Coughing and Deep Breathing, Total Joint Packet and MRSA Information

## 2011-06-13 ENCOUNTER — Encounter (HOSPITAL_COMMUNITY): Payer: Self-pay | Admitting: Vascular Surgery

## 2011-06-13 NOTE — Consult Note (Signed)
Anesthesia:  Patient is a 61 year old male scheduled for a right eye phacoemulsification, IOL on 06/19/11 with anticipated use of MAC.  His history includes DM2 on insulin, HH, HTN, chemo induced LV dysfunction with last EF '04 improved to 55-65%, CVA, HLD, former smoker, and Hodgkin's Lymphoma s/p chemo 2000.    CXR showed emphysema without acute disease.  Labs noted.  He will get a CBG done on arrival for surgery.  His A1C was 7.0 on 05/13/11.  EKG on 11/12/10 showed SR, LAFB, non-specific T wave changes (lateral).  Echo on 09/06/02 showed: - Overall left ventricular systolic function was normal. Left ventricular ejection fraction was estimated , range being 55 % to 65 %. This study was inadequate for the evaluation of left ventricular regional wall motion-apex not adequately imaged. There was mild focal basal septal hypertrophy. - Left atrial size was at the upper limits of normal.  (According to a H&P from his PCP Dr. Amador Cunas from 07/15/06, he had a Cardiolite stress test and echo in 2002 that showed no perfusion defects and an EF of 35-45%.)  Previous notes mention that he has had a cardiac cath, but I spoke with [name], and he denies this.  He also denies CP, SOB, and LE edema.  He just saw his PCP on 05/13/11 and by note did not appear to show any worrisome CV or CHF signs or symptoms.  He is not followed by a Cardiologist.  Of note, he reports some nasal congestion and is starting a Z-pack.  I instructed him to let Dr. Clarisa Kindred know if he develops worsening symptoms or fever, as that might indicate that his procedure should be postponed.

## 2011-06-14 ENCOUNTER — Encounter (HOSPITAL_COMMUNITY): Payer: Self-pay | Admitting: Pharmacy Technician

## 2011-06-18 ENCOUNTER — Other Ambulatory Visit: Payer: Self-pay

## 2011-06-18 ENCOUNTER — Telehealth: Payer: Self-pay | Admitting: Internal Medicine

## 2011-06-18 MED ORDER — GATIFLOXACIN 0.5 % OP SOLN
1.0000 [drp] | OPHTHALMIC | Status: AC | PRN
  Administered 2011-06-19 (×3): 1 [drp] via OPHTHALMIC
  Filled 2011-06-18: qty 2.5

## 2011-06-18 MED ORDER — TETRACAINE HCL 0.5 % OP SOLN
2.0000 [drp] | OPHTHALMIC | Status: AC
  Administered 2011-06-19: 2 [drp] via OPHTHALMIC
  Filled 2011-06-18: qty 2

## 2011-06-18 MED ORDER — HOMATROPINE HBR 2 % OP SOLN
1.0000 [drp] | OPHTHALMIC | Status: DC | PRN
  Filled 2011-06-18: qty 5

## 2011-06-18 MED ORDER — POTASSIUM CHLORIDE CRYS ER 20 MEQ PO TBCR: 20.0000 meq | EXTENDED_RELEASE_TABLET | Freq: Every day | ORAL | Status: DC

## 2011-06-18 MED ORDER — PREDNISOLONE ACETATE 1 % OP SUSP
1.0000 [drp] | OPHTHALMIC | Status: AC
  Administered 2011-06-19: 1 [drp] via OPHTHALMIC
  Filled 2011-06-18: qty 5

## 2011-06-18 MED ORDER — PHENYLEPHRINE HCL 2.5 % OP SOLN
1.0000 [drp] | OPHTHALMIC | Status: AC | PRN
  Administered 2011-06-19 (×3): 1 [drp] via OPHTHALMIC
  Filled 2011-06-18: qty 3

## 2011-06-18 NOTE — Telephone Encounter (Signed)
Spoke with pt - med list indicates qd - which is what pt states he has been taking

## 2011-06-18 NOTE — Telephone Encounter (Signed)
Needs clarification on directions for Glimepiride. Is it 1qd or bid? Thanks.

## 2011-06-19 ENCOUNTER — Telehealth: Payer: Self-pay | Admitting: Internal Medicine

## 2011-06-19 ENCOUNTER — Ambulatory Visit (HOSPITAL_COMMUNITY): Payer: 59 | Admitting: Vascular Surgery

## 2011-06-19 ENCOUNTER — Encounter (HOSPITAL_COMMUNITY)
Admission: RE | Disposition: A | Discharge status: Home / Self Care | Payer: Self-pay | Source: Ambulatory Visit | Attending: Ophthalmology

## 2011-06-19 ENCOUNTER — Encounter (HOSPITAL_COMMUNITY): Payer: Self-pay | Admitting: Vascular Surgery

## 2011-06-19 ENCOUNTER — Ambulatory Visit (HOSPITAL_COMMUNITY)
Admission: RE | Admit: 2011-06-19 | Discharge: 2011-06-19 | Disposition: A | Discharge status: Home / Self Care | Payer: 59 | Source: Ambulatory Visit | Attending: Ophthalmology | Admitting: Ophthalmology

## 2011-06-19 ENCOUNTER — Encounter (HOSPITAL_COMMUNITY): Payer: Self-pay | Admitting: *Deleted

## 2011-06-19 DIAGNOSIS — I10 Essential (primary) hypertension: Secondary | ICD-10-CM | POA: Insufficient documentation

## 2011-06-19 DIAGNOSIS — Z01812 Encounter for preprocedural laboratory examination: Secondary | ICD-10-CM | POA: Insufficient documentation

## 2011-06-19 DIAGNOSIS — I509 Heart failure, unspecified: Secondary | ICD-10-CM | POA: Insufficient documentation

## 2011-06-19 DIAGNOSIS — R0981 Nasal congestion: Secondary | ICD-10-CM

## 2011-06-19 DIAGNOSIS — E119 Type 2 diabetes mellitus without complications: Secondary | ICD-10-CM | POA: Insufficient documentation

## 2011-06-19 DIAGNOSIS — H268 Other specified cataract: Secondary | ICD-10-CM | POA: Insufficient documentation

## 2011-06-19 DIAGNOSIS — Z01818 Encounter for other preprocedural examination: Secondary | ICD-10-CM | POA: Insufficient documentation

## 2011-06-19 HISTORY — DX: Nasal congestion: R09.81

## 2011-06-19 HISTORY — PX: CATARACT EXTRACTION W/PHACO: SHX586

## 2011-06-19 LAB — GLUCOSE, CAPILLARY: Glucose-Capillary: 102 mg/dL — ABNORMAL HIGH (ref 70–99)

## 2011-06-19 SURGERY — PHACOEMULSIFICATION, CATARACT, WITH IOL INSERTION
Anesthesia: Monitor Anesthesia Care | Site: Eye | Laterality: Right | Wound class: Clean

## 2011-06-19 MED ORDER — WATER FOR IRRIGATION, STERILE IR SOLN
Status: DC | PRN
  Administered 2011-06-19: 1000 mL via OPHTHALMIC

## 2011-06-19 MED ORDER — BALANCED SALT IO SOLN
INTRAOCULAR | Status: DC | PRN
  Administered 2011-06-19: 15 mL via INTRAOCULAR

## 2011-06-19 MED ORDER — BUPIVACAINE HCL 0.75 % IJ SOLN
INTRAMUSCULAR | Status: DC | PRN
  Administered 2011-06-19: 10 mL

## 2011-06-19 MED ORDER — LIDOCAINE HCL (PF) 2 % IJ SOLN
INTRAMUSCULAR | Status: DC | PRN
  Administered 2011-06-19: 10 mL

## 2011-06-19 MED ORDER — PROPOFOL 10 MG/ML IV EMUL
INTRAVENOUS | Status: DC | PRN
  Administered 2011-06-19: 10 mg via INTRAVENOUS
  Administered 2011-06-19: 30 mg via INTRAVENOUS
  Administered 2011-06-19 (×2): 20 mg via INTRAVENOUS
  Administered 2011-06-19: 10 mg via INTRAVENOUS
  Administered 2011-06-19 (×5): 20 mg via INTRAVENOUS

## 2011-06-19 MED ORDER — CEFAZOLIN SUBCONJUNCTIVAL INJECTION 100 MG/0.5 ML
200.0000 mg | INJECTION | Freq: Once | SUBCONJUNCTIVAL | Status: DC
  Filled 2011-06-19: qty 1

## 2011-06-19 MED ORDER — PROVISC 10 MG/ML IO SOLN
INTRAOCULAR | Status: DC | PRN
  Administered 2011-06-19: .85 mL via INTRAOCULAR

## 2011-06-19 MED ORDER — ONDANSETRON HCL 4 MG/2ML IJ SOLN
INTRAMUSCULAR | Status: DC | PRN
  Administered 2011-06-19: 4 mg via INTRAVENOUS

## 2011-06-19 MED ORDER — MIDAZOLAM HCL 5 MG/5ML IJ SOLN
INTRAMUSCULAR | Status: DC | PRN
  Administered 2011-06-19: 2 mg via INTRAVENOUS

## 2011-06-19 MED ORDER — NA CHONDROIT SULF-NA HYALURON 40-30 MG/ML IO SOLN
INTRAOCULAR | Status: DC | PRN
  Administered 2011-06-19: 0.5 mL via INTRAOCULAR

## 2011-06-19 MED ORDER — TRIAMCINOLONE ACETONIDE 40 MG/ML IJ SUSP
INTRAMUSCULAR | Status: DC | PRN
  Administered 2011-06-19: 40 mg

## 2011-06-19 MED ORDER — DEXAMETHASONE SODIUM PHOSPHATE 10 MG/ML IJ SOLN
INTRAMUSCULAR | Status: DC | PRN
  Administered 2011-06-19: 10 mg

## 2011-06-19 MED ORDER — SODIUM CHLORIDE 0.9 % IV SOLN
INTRAVENOUS | Status: DC | PRN
  Administered 2011-06-19: 08:00:00 via INTRAVENOUS

## 2011-06-19 MED ORDER — HYPROMELLOSE (GONIOSCOPIC) 2.5 % OP SOLN
OPHTHALMIC | Status: DC | PRN
  Administered 2011-06-19: 2 [drp] via OPHTHALMIC

## 2011-06-19 MED ORDER — TETRACAINE HCL 0.5 % OP SOLN
OPHTHALMIC | Status: DC | PRN
  Administered 2011-06-19: 1 [drp] via OPHTHALMIC

## 2011-06-19 MED ORDER — CEFAZOLIN SUBCONJUNCTIVAL INJECTION 100 MG/0.5 ML
INJECTION | SUBCONJUNCTIVAL | Status: DC | PRN
  Administered 2011-06-19: 1 mL via SUBCONJUNCTIVAL

## 2011-06-19 MED ORDER — BACITRACIN-POLYMYXIN B 500-10000 UNIT/GM OP OINT
TOPICAL_OINTMENT | OPHTHALMIC | Status: DC | PRN
  Administered 2011-06-19: 1 via OPHTHALMIC

## 2011-06-19 MED ORDER — FENTANYL CITRATE 0.05 MG/ML IJ SOLN
INTRAMUSCULAR | Status: DC | PRN
  Administered 2011-06-19 (×2): 50 ug via INTRAVENOUS

## 2011-06-19 MED ORDER — HOMATROPINE HBR 5 % OP SOLN
OPHTHALMIC | Status: AC
  Filled 2011-06-19: qty 5

## 2011-06-19 MED ORDER — EPINEPHRINE HCL 1 MG/ML IJ SOLN
INTRAOCULAR | Status: DC | PRN
  Administered 2011-06-19: 08:00:00

## 2011-06-19 SURGICAL SUPPLY — 63 items
APL SRG 3 HI ABS STRL LF PLS (MISCELLANEOUS) ×1
APPLICATOR COTTON TIP 6IN STRL (MISCELLANEOUS) ×2 IMPLANT
APPLICATOR DR MATTHEWS STRL (MISCELLANEOUS) ×2 IMPLANT
BLADE EYE MINI 60D BEAVER (BLADE) ×1 IMPLANT
BLADE KERATOME 2.75 (BLADE) ×2 IMPLANT
BLADE STAB KNIFE 15DEG (BLADE) IMPLANT
CANNULA ANTERIOR CHAMBER 27GA (MISCELLANEOUS) ×1 IMPLANT
CLOTH BEACON ORANGE TIMEOUT ST (SAFETY) ×2 IMPLANT
DRAPE OPHTHALMIC 77X100 STRL (CUSTOM PROCEDURE TRAY) ×2 IMPLANT
DRAPE POUCH INSTRU U-SHP 10X18 (DRAPES) ×2 IMPLANT
DRSG TEGADERM 4X4.75 (GAUZE/BANDAGES/DRESSINGS) ×2 IMPLANT
EYE SHIELD UNIVERSAL CLEAR (GAUZE/BANDAGES/DRESSINGS) ×1 IMPLANT
FILTER BLUE MILLIPORE (MISCELLANEOUS) IMPLANT
GLOVE BIO SURGEON STRL SZ 6.5 (GLOVE) ×1 IMPLANT
GLOVE ECLIPSE 6.5 STRL STRAW (GLOVE) ×2 IMPLANT
GLOVE SS BIOGEL STRL SZ 6.5 (GLOVE) ×1 IMPLANT
GLOVE SUPERSENSE BIOGEL SZ 6.5 (GLOVE) ×2
GOWN PREVENTION PLUS XLARGE (GOWN DISPOSABLE) ×2 IMPLANT
GOWN STRL NON-REIN LRG LVL3 (GOWN DISPOSABLE) ×2 IMPLANT
KIT ROOM TURNOVER OR (KITS) IMPLANT
KNIFE GRIESHABER SHARP 2.5MM (MISCELLANEOUS) ×2 IMPLANT
LENS IOL ACRYSOF MP POST 18.0 (Intraocular Lens) ×1 IMPLANT
MA50BM IMPLANT
MARKER SKIN DUAL TIP RULER LAB (MISCELLANEOUS) ×2 IMPLANT
NDL 18GX1X1/2 (RX/OR ONLY) (NEEDLE) IMPLANT
NDL 25GX 5/8IN NON SAFETY (NEEDLE) ×1 IMPLANT
NDL FILTER BLUNT 18X1 1/2 (NEEDLE) IMPLANT
NDL HYPO 30X.5 LL (NEEDLE) ×2 IMPLANT
NEEDLE 18GX1X1/2 (RX/OR ONLY) (NEEDLE) ×4 IMPLANT
NEEDLE 22X1 1/2 (OR ONLY) (NEEDLE) ×2 IMPLANT
NEEDLE 25GX 5/8IN NON SAFETY (NEEDLE) ×4 IMPLANT
NEEDLE FILTER BLUNT 18X 1/2SAF (NEEDLE)
NEEDLE FILTER BLUNT 18X1 1/2 (NEEDLE) IMPLANT
NEEDLE HYPO 30X.5 LL (NEEDLE) ×4 IMPLANT
NS IRRIG 1000ML POUR BTL (IV SOLUTION) ×2 IMPLANT
PACK CATARACT CUSTOM (CUSTOM PROCEDURE TRAY) ×2 IMPLANT
PACK CATARACT MCHSCP (PACKS) ×2 IMPLANT
PACK COMBINED CATERACT/VIT 23G (OPHTHALMIC RELATED) IMPLANT
PAD ARMBOARD 7.5X6 YLW CONV (MISCELLANEOUS) ×3 IMPLANT
PAD EYE OVAL STERILE LF (GAUZE/BANDAGES/DRESSINGS) IMPLANT
PHACO TIP KELMAN 45DEG (TIP) ×2 IMPLANT
PROBE ANTERIOR 20G W/INFUS NDL (MISCELLANEOUS) IMPLANT
ROLLS DENTAL (MISCELLANEOUS) ×2 IMPLANT
SHUTTLE MONARCH TYPE A (NEEDLE) ×2 IMPLANT
SOLUTION ANTI FOG 6CC (MISCELLANEOUS) ×2 IMPLANT
SPEAR EYE SURG WECK-CEL (MISCELLANEOUS) ×6 IMPLANT
SUT ETHILON 10-0 CS-B-6CS-B-6 (SUTURE) ×2
SUT ETHILON 5 0 P 3 18 (SUTURE)
SUT ETHILON 9 0 TG140 8 (SUTURE) IMPLANT
SUT NYLON ETHILON 5-0 P-3 1X18 (SUTURE) IMPLANT
SUT PLAIN 6 0 TG1408 (SUTURE) IMPLANT
SUT POLY NON ABSORB 10-0 8 STR (SUTURE) IMPLANT
SUT VICRYL 6 0 S 29 12 (SUTURE) IMPLANT
SUTURE EHLN 10-0 CS-B-6CS-B-6 (SUTURE) ×1 IMPLANT
SYR 20CC LL (SYRINGE) IMPLANT
SYR 50ML LL SCALE MARK (SYRINGE) IMPLANT
SYR 5ML LL (SYRINGE) IMPLANT
SYR TB 1ML LUER SLIP (SYRINGE) ×2 IMPLANT
SYRINGE 10CC LL (SYRINGE) IMPLANT
TAPE TRANSPARENT 1/2IN (GAUZE/BANDAGES/DRESSINGS) ×2 IMPLANT
TOWEL OR 17X24 6PK STRL BLUE (TOWEL DISPOSABLE) ×4 IMPLANT
WATER STERILE IRR 1000ML POUR (IV SOLUTION) ×2 IMPLANT
WIPE INSTRUMENT VISIWIPE 73X73 (MISCELLANEOUS) ×2 IMPLANT

## 2011-06-19 NOTE — Telephone Encounter (Signed)
Pt had cataract surgery today. Pt has questions concerning medication. Pt is requesting kim to return his call today.

## 2011-06-19 NOTE — Telephone Encounter (Signed)
Spoke with pt- ok to resume meds

## 2011-06-19 NOTE — Progress Notes (Signed)
HOMATROPINE  DROPS ARE 5%..@BARCODE2D (Error - No data available.)2% WAS ORDERED BY DR GEIGER... THE PHARMACY IS AWARE AND IS GOING TO CHANGE THE ORDER.  THEY WERE GIVEN  AAT 0705  0708 AND   [phone]

## 2011-06-19 NOTE — Transfer of Care (Signed)
Immediate Anesthesia Transfer of Care Note  Patient: [name]  Procedure(s) Performed:  CATARACT EXTRACTION PHACO AND INTRAOCULAR LENS PLACEMENT (IOC) - Alcon Acrysof FMA50BM +18.00 D Right  Patient Location: Short Stay  Anesthesia Type: MAC  Level of Consciousness: awake, alert  and oriented  Airway & Oxygen Therapy: Patient Spontanous Breathing  Post-op Assessment: Report given to PACU RN  Post vital signs: Reviewed and stable Filed Vitals:   06/19/11 1001  BP:   Pulse: 83  Temp: 36.1 C  Resp: 16    Complications: No apparent anesthesia complications

## 2011-06-19 NOTE — Preoperative (Signed)
Beta Blockers   Reason not to administer Beta Blockers:Not Applicable 

## 2011-06-19 NOTE — Op Note (Signed)
OSHAE SIMMERING 06/19/2011 @DIAG @  Procedure: Phacoemulsification, Posterior Chamber Intra-ocular Lens Operative Eye:  right eye  Surgeon: Shade Flood Estimated Blood Loss: minimal Specimens for Pathology:  None Complications: posterior capsule tear  The patient was prepared and draped in the usual manner for ocular surgery on the right eye. A Cook lid speculum was placed. A peripheral clear corneal incision was made at the surgical limbus centered at the 11:00 meridian. A separate clear corneal stab incision was made with a 15 degree blade at the 2:00 meridian to permit bi-manual technique. Provisc was instilled into the anterior chamber through that incision.  A keratome was used to create a self sealing incision entering the anterior chamber at the 11:00 meridian. A capsulorhexis was performed using a bent 25g needle. The lens was hydrodissected and the nucleus was hydrodilineated using a Nichammin cannula. The Chang chopper was inserted and used to rotate the lens to insure adequate lens mobility. The phacoemulsification handpiece was inserted and a combined phaco-chop technique was employed, fracturing the lens into separate sections with subsequent removal with the phaco handpiece. After completing nucleus removal, during removal of cortex an inferior capsule tear was noted. The majority of cortex was removed uneventfully, but a fragment of cortex did fall posteriorly.  The I/A cannula was used to remove remaining lens cortex. Provisc was instilled and used to deepen the anterior chamber and posterior capsule bag. The Monarch injector was used to place a folded Acrysof MA50BM PC IOL, + 18.00  diopters, into the capsule bag. A McPherson forceps were used to dial in the trailing haptic.  The I/A cannula was used to remove the viscoelastic from the anterior chamber and to insure there was no vitreous stand to the wound. BSS was used to bring IOP to the desired range and the wound was checked to  insure it was watertight. A single 9-0 nylon was used Subconjunctival injections of Ancef 100/0.61ml and posterior sub-tenons Kenalog 40mg /57ml were placed without complication. The lid speculum and drapes were removed and the patient's eye was patched with Polymixin/Bacitracin ophthalmic ointment. An eye shield was placed and the patient was transferred alert and conversant from the operating room to the post-operative recovery area.   Shade Flood, MD

## 2011-06-19 NOTE — Anesthesia Preprocedure Evaluation (Addendum)
Anesthesia Evaluation  Patient identified by MRN, date of birth, ID band Patient awake    Reviewed: Allergy & Precautions, H&P , NPO status , Patient's Chart, lab work & pertinent test results  Airway Mallampati: II  Neck ROM: full    Dental  (+) Teeth Intact and Dental Advisory Given   Pulmonary pneumonia ,          Cardiovascular hypertension, +CHF     Neuro/Psych  Neuromuscular disease CVA    GI/Hepatic hiatal hernia,   Endo/Other  Diabetes mellitus-  Renal/GU      Musculoskeletal   Abdominal   Peds  Hematology   Anesthesia Other Findings   Reproductive/Obstetrics                          Anesthesia Physical Anesthesia Plan  ASA: III  Anesthesia Plan: MAC   Post-op Pain Management:    Induction: Intravenous  Airway Management Planned: Nasal Cannula  Additional Equipment:   Intra-op Plan:   Post-operative Plan:   Informed Consent: I have reviewed the patients History and Physical, chart, labs and discussed the procedure including the risks, benefits and alternatives for the proposed anesthesia with the patient or authorized representative who has indicated his/her understanding and acceptance.     Plan Discussed with: CRNA and Surgeon  Anesthesia Plan Comments:         Anesthesia Quick Evaluation

## 2011-06-19 NOTE — Interval H&P Note (Signed)
History and Physical Interval Note:  06/19/2011 8:35 AM  [name]  has presented today for surgery, with the diagnosis of Combined Cataract  The various methods of treatment have been discussed with the patient and family. After consideration of risks, benefits and other options for treatment, the patient has consented to  Procedure(s): CATARACT EXTRACTION PHACO AND INTRAOCULAR LENS PLACEMENT (IOC) as a surgical intervention .  The patients' history has been reviewed, patient examined, no change in status, stable for surgery.  I have reviewed the patients' chart and labs.  Questions were answered to the patient's satisfaction.     Dustin Burrill/MD

## 2011-06-19 NOTE — H&P (View-Only) (Signed)
Pre-operative History and Physical for Ophthalmic Surgery  [name] 05/20/2011                  Chief Complaint: Decreased vision right eye  Diagnosis: Combined Cataract  Allergies  Allergen Reactions  . Codeine Phosphate     REACTION: unspecified     Prior to Admission medications   Medication Sig Start Date End Date Taking? Authorizing Provider  ACCU-CHEK AVIVA PLUS test strip USE DAILY AS DIRECTED 11/12/10   Peter Frank Kwiatkowski  amLODipine (NORVASC) 10 MG tablet Take 1 tablet (10 mg total) by mouth daily. 07/19/10 07/19/11  Peter Frank Kwiatkowski  aspirin 325 MG tablet Take 325 mg by mouth daily.      Historical Provider, MD  atorvastatin (LIPITOR) 10 MG tablet Take 1 tablet (10 mg total) by mouth daily. 07/19/10 07/19/11  Peter Frank Kwiatkowski  B-D UF III MINI PEN NEEDLES 31G X 5 MM MISC USE TWICE A DAY 08/24/10   Peter Frank Kwiatkowski  Exenatide (BYDUREON) 2 MG SUSR Inject 1 vial into the skin once a week. 11/12/10   Peter Frank Kwiatkowski  fexofenadine-pseudoephedrine (ALLEGRA-D) 60-120 MG per tablet Take 1 tablet by mouth.      Historical Provider, MD  fosinopril (MONOPRIL) 20 MG tablet TAKE 1 TABLET TWICE A DAY 01/07/11   Peter Frank Kwiatkowski  glimepiride (AMARYL) 4 MG tablet TAKE 1 TABLET TWICE A DAY 11/03/10   Peter Frank Kwiatkowski  glucose blood (ACCU-CHEK COMFORT CURVE) test strip 1 each by Other route daily. Use as instructed     Historical Provider, MD  KLOR-CON M20 20 MEQ tablet TAKE 1 TABLET EVERY DAY 12/10/10   Peter Frank Kwiatkowski  Lancets (ACCU-CHEK MULTICLIX) lancets USE AS DIRECTED 02/05/11   Peter Frank Kwiatkowski  LORazepam (ATIVAN) 0.5 MG tablet Take 1 tablet (0.5 mg total) by mouth 2 (two) times daily as needed. 10/08/10   Peter Frank Kwiatkowski  metFORMIN (GLUCOPHAGE) 1000 MG tablet TAKE 1 TABLET TWICE A DAY 01/24/11   Peter Frank Kwiatkowski  metoprolol (LOPRESSOR) 50 MG tablet TAKE 1 TABLET EVERY DAY 12/03/10   Peter Frank Kwiatkowski  Multiple  Vitamins-Minerals (CENTRUM SILVER PO) Take 1 tablet by mouth daily.      Historical Provider, MD  Omega-3 Fatty Acids (FISH OIL) 300 MG CAPS Take by mouth daily.      Historical Provider, MD  temazepam (RESTORIL) 15 MG capsule Take 15 mg by mouth at bedtime.      Historical Provider, MD  terazosin (HYTRIN) 5 MG capsule TAKE ONE CAPSULE EVERY DAY 12/06/10   Peter Frank Kwiatkowski    Planned Procedure:Phacoemulsification, Posterior Chamber Intra-ocular Lens  There were no vitals filed for this visit.  Pulse: 80        Resp:  18  ROS: non-contributory  Past Medical History  Diagnosis Date  . Hodgkin's lymphoma     stage IV B status post ABCD chemotherapy -2000  . Diabetes mellitus type II   . Hypertension   . 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 CVA (cerebral vascular accident)     hx of right brain subcortical stroke  . Hyperlipidemia   . Radiculopathy     S1  . Pneumonia Feb 2008    Past Surgical History  Procedure Date  . Lymph node biospy   . Cardiac catheterization june 2012  . Colonoscopy 2003  . 2-d echocardiogram April 2002    ejection fraction 55 to 65%       History   Social History  . Marital Status: Married    Spouse Name: N/A    Number of Children: N/A  . Years of Education: N/A   Occupational History  . Not on file.   Social History Main Topics  . Smoking status: Former Smoker    Quit date: 06/03/2004  . Smokeless tobacco: Not on file  . Alcohol Use: Not on file  . Drug Use: Not on file  . Sexually Active: Not on file   Other Topics Concern  . Not on file   Social History Narrative  . No narrative on file     The following examination is for anesthesia clearance for minimally invasive Ophthalmic surgery. It is primarily to document heart and lung findings and is not intended to elucidate unknown general medical conditions inclusive of abdominal masses, lung lesions, etc.   General Constitution:   within normal limits    Alertness/Orientation:  Person, time place     yes   HEENT:  Eye Findings: Combined Cataract                   right eye  Neck: supple without masses   Chest/Lungs: clear to auscultation   Cardiac: Normal S1 and S2 without Murmur, S3 or S4   Neuro: non-focal   Impression: Visually Significant Cataract OD  Planned Procedure:  Phacoemulsification, Posterior Chamber Intraocular Lens     Parvin Stetzer, MD        

## 2011-06-19 NOTE — Anesthesia Postprocedure Evaluation (Signed)
  Anesthesia Post-op Note  Patient: [name]  Procedure(s) Performed:  CATARACT EXTRACTION PHACO AND INTRAOCULAR LENS PLACEMENT (IOC) - Alcon Acrysof FMA50BM +18.00 D Right  Patient Location: Short Stay  Anesthesia Type: MAC  Level of Consciousness: awake, alert  and oriented  Airway and Oxygen Therapy: Patient Spontanous Breathing  Post-op Pain: none  Post-op Assessment: Post-op Vital signs reviewed  Post-op Vital Signs: Reviewed and stable  Complications: No apparent anesthesia complications

## 2011-06-19 NOTE — Progress Notes (Signed)
Dr. Clarisa Kindred aware that substitution of  5% homatropine was made for 2% atropine per pharmacy and is okay with that.

## 2011-06-20 ENCOUNTER — Encounter (HOSPITAL_COMMUNITY): Payer: Self-pay | Admitting: Ophthalmology

## 2011-06-20 LAB — GLUCOSE, CAPILLARY: Glucose-Capillary: 87 mg/dL (ref 70–99)

## 2011-06-20 MED FILL — Homatropine HBr Ophth Soln 5%: OPHTHALMIC | Qty: 5 | Status: AC

## 2011-06-28 ENCOUNTER — Other Ambulatory Visit (HOSPITAL_BASED_OUTPATIENT_CLINIC_OR_DEPARTMENT_OTHER): Payer: 59 | Admitting: Lab

## 2011-06-28 ENCOUNTER — Telehealth: Payer: Self-pay | Admitting: Oncology

## 2011-06-28 ENCOUNTER — Ambulatory Visit: Payer: 59 | Admitting: Oncology

## 2011-06-28 VITALS — BP 141/70 | HR 97 | Temp 97.2°F | Ht 66.0 in | Wt 167.4 lb

## 2011-06-28 DIAGNOSIS — C8190 Hodgkin lymphoma, unspecified, unspecified site: Secondary | ICD-10-CM

## 2011-06-28 LAB — CBC WITH DIFFERENTIAL/PLATELET
BASO%: 0.6 % (ref 0.0–2.0)
Basophils Absolute: 0 10*3/uL (ref 0.0–0.1)
EOS%: 3.2 % (ref 0.0–7.0)
HGB: 13.3 g/dL (ref 13.0–17.1)
MCH: 32.7 pg (ref 27.2–33.4)
MCHC: 33.6 g/dL (ref 32.0–36.0)
MONO#: 0.5 10*3/uL (ref 0.1–0.9)
RDW: 13.4 % (ref 11.0–14.6)
WBC: 3.3 10*3/uL — ABNORMAL LOW (ref 4.0–10.3)
lymph#: 0.9 10*3/uL (ref 0.9–3.3)

## 2011-06-28 NOTE — Telephone Encounter (Signed)
Per Kenney Houseman Dr  Truett Perna had to leave and to r/s pt for next avail.  Set up for 08/12/11.  Will

## 2011-07-03 ENCOUNTER — Other Ambulatory Visit: Payer: Self-pay

## 2011-07-03 MED ORDER — GLUCOSE BLOOD VI STRP: ORAL_STRIP | Status: DC

## 2011-07-03 NOTE — Telephone Encounter (Signed)
Rx sent to pharmacy electronically.   

## 2011-07-22 NOTE — Progress Notes (Signed)
Patient was sent home due to provider being sick

## 2011-08-12 ENCOUNTER — Encounter: Payer: Self-pay | Admitting: Internal Medicine

## 2011-08-12 ENCOUNTER — Ambulatory Visit (INDEPENDENT_AMBULATORY_CARE_PROVIDER_SITE_OTHER): Payer: 59 | Admitting: Internal Medicine

## 2011-08-12 ENCOUNTER — Ambulatory Visit (HOSPITAL_BASED_OUTPATIENT_CLINIC_OR_DEPARTMENT_OTHER): Payer: 59 | Admitting: Oncology

## 2011-08-12 ENCOUNTER — Telehealth: Payer: Self-pay | Admitting: Oncology

## 2011-08-12 VITALS — BP 133/79 | HR 98 | Temp 97.3°F | Ht 66.0 in | Wt 171.6 lb

## 2011-08-12 DIAGNOSIS — D72819 Decreased white blood cell count, unspecified: Secondary | ICD-10-CM

## 2011-08-12 DIAGNOSIS — Z8679 Personal history of other diseases of the circulatory system: Secondary | ICD-10-CM

## 2011-08-12 DIAGNOSIS — E119 Type 2 diabetes mellitus without complications: Secondary | ICD-10-CM

## 2011-08-12 DIAGNOSIS — I10 Essential (primary) hypertension: Secondary | ICD-10-CM

## 2011-08-12 DIAGNOSIS — C8190 Hodgkin lymphoma, unspecified, unspecified site: Secondary | ICD-10-CM

## 2011-08-12 NOTE — Progress Notes (Signed)
  Subjective:    Patient ID: [name], male    DOB: 1950/12/18, 61 y.o.   MRN: [id number]  HPI  61 year old patient who is seen today for followup. He has a history of type 2 diabetes. His last hemoglobin A1c 3 months ago was 7.0. He has treated hypertension and dyslipidemia which have been stable. He has a history of congestive heart failure which also has been stable. No new concerns or complaints.  Review of Systems  Constitutional: Negative for fever, chills, appetite change and fatigue.  HENT: Negative for hearing loss, ear pain, congestion, sore throat, trouble swallowing, neck stiffness, dental problem, voice change and tinnitus.   Eyes: Negative for pain, discharge and visual disturbance.  Respiratory: Negative for cough, chest tightness, wheezing and stridor.   Cardiovascular: Negative for chest pain, palpitations and leg swelling.  Gastrointestinal: Negative for nausea, vomiting, abdominal pain, diarrhea, constipation, blood in stool and abdominal distention.  Genitourinary: Negative for urgency, hematuria, flank pain, discharge, difficulty urinating and genital sores.  Musculoskeletal: Negative for myalgias, back pain, joint swelling, arthralgias and gait problem.  Skin: Negative for rash.  Neurological: Negative for dizziness, syncope, speech difficulty, weakness, numbness and headaches.  Hematological: Negative for adenopathy. Does not bruise/bleed easily.  Psychiatric/Behavioral: Negative for behavioral problems and dysphoric mood. The patient is not nervous/anxious.        Objective:   Physical Exam  Constitutional: He is oriented to person, place, and time. He appears well-developed.  HENT:  Head: Normocephalic.  Right Ear: External ear normal.  Left Ear: External ear normal.  Eyes: Conjunctivae and EOM are normal.  Neck: Normal range of motion.  Cardiovascular: Normal rate and normal heart sounds.   Pulmonary/Chest: He has rales.  Abdominal: Bowel sounds are  normal.  Musculoskeletal: Normal range of motion. He exhibits no edema and no tenderness.  Neurological: He is alert and oriented to person, place, and time.  Psychiatric: He has a normal mood and affect. His behavior is normal.          Assessment & Plan:   Diabetes mellitus. We'll check a hemoglobin A1c. Exercise modest weight loss all encouraged Congestive heart failure stable low-salt diet recommended Hypertension well controlled Coronary artery disease asymptomatic

## 2011-08-12 NOTE — Patient Instructions (Addendum)
Limit your sodium (Salt) intake   Please check your hemoglobin A1c every 3 months    It is important that you exercise regularly, at least 20 minutes 3 to 4 times per week.  If you develop chest pain or shortness of breath seek  medical attention.   

## 2011-08-12 NOTE — Progress Notes (Signed)
OFFICE PROGRESS NOTE   INTERVAL HISTORY:   He returns as scheduled. He has a good appetite and energy level. He is exercising.  He reports current sinus drainage. The nodular skin lesions occur intermittently and spontaneously resolved. He reports developing a lesion every few months. He has no associated symptoms.  Objective:  Vital signs in last 24 hours:  Blood pressure 133/79, pulse 98, temperature 97.3 F (36.3 C), temperature source Oral, height 5\' 6"  (1.676 m), weight 171 lb 9.6 oz (77.837 kg).    HEENT: Neck without mass Lymphatics: No cervical, supraclavicular, axillary, or inguinal lymph nodes Resp: Distant breath sounds. No respiratory distress. Cardio: Regular rate and rhythm  GI: No hepatosplenomegaly Vascular: No leg edema  Skin: No nodular skin lesions    Lab Results:  Lab Results  Component Value Date   WBC 3.3* 06/28/2011   HGB 13.3 06/28/2011   HCT 39.7 06/28/2011   MCV 97.4 06/28/2011   PLT 328 06/28/2011   ANC 1.8, absolute lymphocyte count 0.9   Medications: I have reviewed the patient's current medications.  Assessment/Plan: 1. Hodgkin lymphoma diagnosed in May 2000 - he remains in clinical remission. 2. History of a nodular skin rash with a biopsy August 02, 2006, confirming a CD30 positive lymphoproliferative disorder.  He reports intermittent nodular skin lesions over the past few years that spontaneously resolve and occur months apart.  3. Mild leukopenia-likely a benign finding. Review of the medical record indicates a history of leukopenia in the past.    Disposition:  He remains in clinical remission from the Hodgkin's lymphoma. He had a skin rash concerning for a non-Hodgkin's lymphoma in 2008. There's been no clinical evidence for a progressive cutaneous lymphoma. He will contact us if he develops a persistent nodular skin lesion and we will arrange for a biopsy.  Mr. Chestnut would like to continue followup at the cancer Center. He will return  for an office visit in January of 2014.   Lucile Shutters, MD  08/12/2011  9:35 PM

## 2011-08-12 NOTE — Telephone Encounter (Signed)
gv pt appt schedule for jan °

## 2011-08-13 NOTE — Progress Notes (Signed)
Quick Note:  Spoke with pt - infoemd of lab and dr. Vernon Prey instructions ______

## 2011-10-05 ENCOUNTER — Other Ambulatory Visit: Payer: Self-pay | Admitting: Emergency Medicine

## 2011-10-08 ENCOUNTER — Other Ambulatory Visit: Payer: Self-pay | Admitting: Internal Medicine

## 2011-10-15 ENCOUNTER — Other Ambulatory Visit: Payer: Self-pay | Admitting: Internal Medicine

## 2011-10-31 ENCOUNTER — Other Ambulatory Visit: Payer: Self-pay | Admitting: Internal Medicine

## 2011-11-05 ENCOUNTER — Other Ambulatory Visit (INDEPENDENT_AMBULATORY_CARE_PROVIDER_SITE_OTHER): Payer: 59

## 2011-11-05 DIAGNOSIS — Z0000 Encounter for general adult medical examination without abnormal findings: Secondary | ICD-10-CM

## 2011-11-05 LAB — BASIC METABOLIC PANEL
BUN: 12 mg/dL (ref 6–23)
CO2: 27 mEq/L (ref 19–32)
Calcium: 8.9 mg/dL (ref 8.4–10.5)
Glucose, Bld: 66 mg/dL — ABNORMAL LOW (ref 70–99)
Sodium: 143 mEq/L (ref 135–145)

## 2011-11-05 LAB — CBC WITH DIFFERENTIAL/PLATELET
Basophils Absolute: 0 10*3/uL (ref 0.0–0.1)
Eosinophils Absolute: 0.1 10*3/uL (ref 0.0–0.7)
Hemoglobin: 14.2 g/dL (ref 13.0–17.0)
Lymphocytes Relative: 27.5 % (ref 12.0–46.0)
Lymphs Abs: 1.2 10*3/uL (ref 0.7–4.0)
MCHC: 32.7 g/dL (ref 30.0–36.0)
Neutro Abs: 2.7 10*3/uL (ref 1.4–7.7)
RDW: 12.8 % (ref 11.5–14.6)

## 2011-11-05 LAB — POCT URINALYSIS DIPSTICK
Bilirubin, UA: NEGATIVE
Glucose, UA: NEGATIVE
Ketones, UA: NEGATIVE
Spec Grav, UA: 1.025

## 2011-11-05 LAB — HEPATIC FUNCTION PANEL
Albumin: 3.7 g/dL (ref 3.5–5.2)
Alkaline Phosphatase: 60 U/L (ref 39–117)

## 2011-11-05 LAB — LIPID PANEL
HDL: 35.8 mg/dL — ABNORMAL LOW (ref >39.00)
Triglycerides: 79 mg/dL (ref 0.0–149.0)

## 2011-11-05 LAB — TSH: TSH: 1.68 u[IU]/mL (ref 0.35–5.50)

## 2011-11-07 ENCOUNTER — Other Ambulatory Visit: Payer: Self-pay | Admitting: Family Medicine

## 2011-11-12 ENCOUNTER — Ambulatory Visit (INDEPENDENT_AMBULATORY_CARE_PROVIDER_SITE_OTHER): Payer: 59 | Admitting: Internal Medicine

## 2011-11-12 ENCOUNTER — Encounter: Payer: Self-pay | Admitting: Internal Medicine

## 2011-11-12 VITALS — BP 130/80 | HR 84 | Temp 97.9°F | Resp 18 | Ht 66.0 in | Wt 171.0 lb

## 2011-11-12 DIAGNOSIS — I10 Essential (primary) hypertension: Secondary | ICD-10-CM

## 2011-11-12 DIAGNOSIS — Z0000 Encounter for general adult medical examination without abnormal findings: Secondary | ICD-10-CM

## 2011-11-12 DIAGNOSIS — E119 Type 2 diabetes mellitus without complications: Secondary | ICD-10-CM

## 2011-11-12 DIAGNOSIS — E785 Hyperlipidemia, unspecified: Secondary | ICD-10-CM

## 2011-11-12 MED ORDER — CANAGLIFLOZIN 300 MG PO TABS: 1.0000 | ORAL_TABLET | ORAL | Status: DC

## 2011-11-12 NOTE — Progress Notes (Signed)
Subjective:    Patient ID: [name], male    DOB: 18-Oct-1950, 61 y.o.   MRN: [id number]  HPI  61 year old patient who is seen today for a preventive health examination. Medical problems include type 2 diabetes. Hemoglobin A1c remains 7.4.  Fasting blood sugar at the time of his fasting lab was 66. Fasting blood sugar today 84  Past Medical History  Diagnosis Date  . 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 Hyperlipidemia   . CVA (cerebral vascular accident) 2005    hx of right brain subcortical stroke  . 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 Pneumonia Feb 2008  . Diabetes mellitus type II   . Arthritis     joints,   . H/O hiatal hernia   . Radiculopathy     S1  . Hypertension     Dr. Maylon Cos cardiology  . Sinus congestion 06/19/11    pt has had sinus "infection" and just finished a z-pak  dr Clarisa Kindred aware    History   Social History  . Marital Status: Married    Spouse Name: N/A    Number of Children: N/A  . Years of Education: N/A   Occupational History  . Not on file.   Social History Main Topics  . Smoking status: Former Smoker -- 30 years    Quit date: 06/03/2004  . Smokeless tobacco: Never Used  . Alcohol Use: No     rarely  . Drug Use: No  . Sexually Active: Not on file   Other Topics Concern  . Not on file   Social History Narrative  . No narrative on file    Past Surgical History  Procedure Date  . Lymph node biospy   . Colonoscopy 2003  . 2-d echocardiogram April 2002    ejection fraction 55 to 65%  . Cataract extraction w/phaco 06/19/2011    Procedure: CATARACT EXTRACTION PHACO AND INTRAOCULAR LENS PLACEMENT (IOC);  Surgeon: Shade Flood, MD;  Location: [facility] OR;  Service: Ophthalmology;  Laterality: Right;  [place] FMA50BM +18.00 D Right    Family History  Problem Relation Age of Onset  . Diabetes Mother   . Heart disease Mother   . Heart failure Father    . Anesthesia problems Neg Hx     Allergies  Allergen Reactions  . Codeine Phosphate     REACTION: unspecified    Current Outpatient Prescriptions on File Prior to Visit  Medication Sig Dispense Refill  . albuterol (VENTOLIN HFA) 108 (90 BASE) MCG/ACT inhaler Inhale 2 puffs into the lungs every 4 (four) hours as needed for wheezing or shortness of breath (cough).  1 Inhaler  1  . amLODipine (NORVASC) 10 MG tablet TAKE 1 TABLET EVERY DAY  90 tablet  4  . aspirin 325 MG tablet Take 325 mg by mouth daily.        Marland Kitchen atorvastatin (LIPITOR) 10 MG tablet Take 10 mg by mouth daily.       Marland Kitchen atorvastatin (LIPITOR) 10 MG tablet TAKE 1 TABLET EVERY DAY  90 tablet  3  . B-D UF III MINI PEN NEEDLES 31G X 5 MM MISC USE TWICE A DAY  100 each  5  . Exenatide (BYDUREON) 2 MG SUSR Inject 1 vial into the skin once a week.  12 each  6  . fexofenadine-pseudoephedrine (ALLEGRA-D) 60-120 MG per tablet Take 1 tablet by mouth  daily as needed. For seasonal allergies      . fluticasone (FLONASE) 50 MCG/ACT nasal spray 2 SPRAYS IN EACH NOSTRIL EVERY DAY  16 g  4  . fosinopril (MONOPRIL) 20 MG tablet TAKE 1 TABLET TWICE A DAY  180 tablet  3  . glimepiride (AMARYL) 4 MG tablet Take 4 mg by mouth daily before breakfast.       . glucose blood (ACCU-CHEK AVIVA PLUS) test strip Use daily as directed.  100 each  1  . glucose blood (ACCU-CHEK COMFORT CURVE) test strip 1 each by Other route daily. Use as instructed       . Lancets (ACCU-CHEK MULTICLIX) lancets USE AS DIRECTED  102 each  3  . LORazepam (ATIVAN) 0.5 MG tablet Take 0.5 mg by mouth 2 (two) times daily as needed. For anxiety      . metFORMIN (GLUCOPHAGE) 1000 MG tablet TAKE 1 TABLET TWICE A DAY  180 tablet  2  . metoprolol (LOPRESSOR) 50 MG tablet TAKE 1 TABLET EVERY DAY  90 tablet  3  . Multiple Vitamins-Minerals (CENTRUM SILVER PO) Take 1 tablet by mouth daily.        . OMEGA 3 1200 MG CAPS Take 1,200 mg by mouth daily.      Bertram Gala Glycol-Propyl Glycol  (SYSTANE ULTRA OP) Place 1-2 drops into both eyes every 2 (two) hours.      . potassium chloride SA (KLOR-CON M20) 20 MEQ tablet Take 1 tablet (20 mEq total) by mouth daily.  90 tablet  3  . temazepam (RESTORIL) 15 MG capsule Take 15 mg by mouth at bedtime as needed. For sleep      . terazosin (HYTRIN) 5 MG capsule TAKE ONE CAPSULE EVERY DAY  90 capsule  3  . DISCONTD: amLODipine (NORVASC) 10 MG tablet Take 10 mg by mouth daily.         BP 130/80  Pulse 84  Temp(Src) 97.9 F (36.6 C) (Oral)  Resp 18  Ht 5\' 6"  (1.676 m)  Wt 171 lb (77.565 kg)  BMI 27.60 kg/m2  SpO2 96%       Review of Systems  Constitutional: Negative for fever, chills, activity change, appetite change and fatigue.  HENT: Negative for hearing loss, ear pain, congestion, rhinorrhea, sneezing, mouth sores, trouble swallowing, neck pain, neck stiffness, dental problem, voice change, sinus pressure and tinnitus.   Eyes: Negative for photophobia, pain, redness and visual disturbance (ptosis right eye since cataract extraction surgery in January).  Respiratory: Negative for apnea, cough, choking, chest tightness, shortness of breath and wheezing.   Cardiovascular: Negative for chest pain, palpitations and leg swelling.  Gastrointestinal: Negative for nausea, vomiting, abdominal pain, diarrhea, constipation, blood in stool, abdominal distention, anal bleeding and rectal pain.  Genitourinary: Negative for dysuria, urgency, frequency, hematuria, flank pain, decreased urine volume, discharge, penile swelling, scrotal swelling, difficulty urinating, genital sores and testicular pain.  Musculoskeletal: Negative for myalgias, back pain, joint swelling, arthralgias and gait problem.  Skin: Negative for color change, rash and wound.  Neurological: Negative for dizziness, tremors, seizures, syncope, facial asymmetry, speech difficulty, weakness, light-headedness, numbness and headaches.  Hematological: Negative for adenopathy. Does  not bruise/bleed easily.  Psychiatric/Behavioral: Negative for suicidal ideas, hallucinations, behavioral problems, confusion, sleep disturbance, self-injury, dysphoric mood, decreased concentration and agitation. The patient is not nervous/anxious.        Objective:   Physical Exam  Constitutional: He appears well-developed and well-nourished.  HENT:  Head: Normocephalic and atraumatic.  Right Ear:  External ear normal.  Left Ear: External ear normal.  Nose: Nose normal.  Mouth/Throat: Oropharynx is clear and moist.  Eyes: Conjunctivae and EOM are normal. Pupils are equal, round, and reactive to light. No scleral icterus.       Ptosis right eye  Neck: Normal range of motion. Neck supple. No JVD present. No thyromegaly present.  Cardiovascular: Regular rhythm, normal heart sounds and intact distal pulses.  Exam reveals no gallop and no friction rub.   No murmur heard. Pulmonary/Chest: Effort normal and breath sounds normal. He exhibits no tenderness.  Abdominal: Soft. Bowel sounds are normal. He exhibits no distension and no mass. There is no tenderness.  Genitourinary: Prostate normal and penis normal.  Musculoskeletal: Normal range of motion. He exhibits no edema and no tenderness.  Lymphadenopathy:    He has no cervical adenopathy.  Neurological: He is alert. He has normal reflexes. No cranial nerve deficit. Coordination normal.       Intact to vibratory sensation soft touch and monofilament testing Achilles reflexes brisk  Skin: Skin is warm and dry. No rash noted.  Psychiatric: He has a normal mood and affect. His behavior is normal.          Assessment & Plan:   Preventive health examination Diabetes mellitus with worsening microalbuminuria. Suboptimal control his hemoglobin A1c 7.4. Will add Invokana 300 mg in followup hemoglobin A1c in 3 months. Nonpharmacologic measures discussed

## 2011-11-12 NOTE — Patient Instructions (Signed)
Please check your hemoglobin A1c every 3 months    It is important that you exercise regularly, at least 20 minutes 3 to 4 times per week.  If you develop chest pain or shortness of breath seek  medical attention.  You need to lose weight.  Consider a lower calorie diet and regular exercise. 

## 2011-11-14 ENCOUNTER — Telehealth: Payer: Self-pay | Admitting: Internal Medicine

## 2011-11-14 NOTE — Telephone Encounter (Signed)
Caller: [name]/Patient is calling with a question about Invokana; the medication was written by [name]; pt was seen in the office on 11/12/11 for routine exam; started on Invokana 300mg  QD on 11/12/11;  pt states that on 11/13/11 started to feel dizzy; very dizzy today and vomited x1 and very jittery;  BGM 87 at 7:30am fasting; BGM 115 at 11:00am; Triaged per Dizziness or Vertigo Guideline; See in 4 hr d/t vertigo with vomiting and not responding to 4 hr homecare; offered appt for today; pt refused states that he just wants to stop the medicaine or decrease the dose; OFFICE PLEASE REVIEW AND CALL PT MADE WITH FURTHER INSTRUCTIONS;  pt aware that message sent to provider/office; will call back if sx worsen

## 2011-11-14 NOTE — Telephone Encounter (Signed)
Hold new med for 2 weeks, then rechallenge

## 2011-11-14 NOTE — Telephone Encounter (Signed)
Attempt to call - Vm - LMTCB - gave dr. Vernon Prey instructions about med

## 2011-11-15 ENCOUNTER — Other Ambulatory Visit: Payer: Self-pay | Admitting: Internal Medicine

## 2011-12-04 ENCOUNTER — Other Ambulatory Visit: Payer: Self-pay | Admitting: Internal Medicine

## 2011-12-10 ENCOUNTER — Other Ambulatory Visit: Payer: Self-pay | Admitting: Internal Medicine

## 2011-12-16 ENCOUNTER — Telehealth: Payer: Self-pay | Admitting: Internal Medicine

## 2011-12-16 NOTE — Telephone Encounter (Signed)
Discontinue the new medication;  stress her diet weight loss and exercise regimen. We'll reassess hemoglobin A1c at next visit and consider alternate medication at that time he

## 2011-12-16 NOTE — Telephone Encounter (Signed)
Pt requesting to be contacted about a medication pt states he spoke with you last week about it and was returning your call. Please contact pt

## 2011-12-16 NOTE — Telephone Encounter (Signed)
Spoke with pt- had stopped invokana as instructed a few wks ago - blood sugars ok, Bps ok - started back invokana 100 a few days - ok then bumped up to 300mg  as rx'd - broke out in rash on weds- atopped and rash gone. Please advise - change med? contiun monitor BS? Other instructions

## 2011-12-17 NOTE — Telephone Encounter (Signed)
Spoke with pt- informed of change , discussed diet and exercise - recheck lab at next rov

## 2012-01-08 ENCOUNTER — Other Ambulatory Visit: Payer: Self-pay | Admitting: Internal Medicine

## 2012-02-11 ENCOUNTER — Other Ambulatory Visit: Payer: Self-pay | Admitting: Internal Medicine

## 2012-02-12 ENCOUNTER — Encounter: Payer: Self-pay | Admitting: Internal Medicine

## 2012-02-12 ENCOUNTER — Ambulatory Visit (INDEPENDENT_AMBULATORY_CARE_PROVIDER_SITE_OTHER): Payer: 59 | Admitting: Internal Medicine

## 2012-02-12 VITALS — BP 120/72 | Temp 97.9°F | Wt 165.0 lb

## 2012-02-12 DIAGNOSIS — E119 Type 2 diabetes mellitus without complications: Secondary | ICD-10-CM

## 2012-02-12 DIAGNOSIS — I509 Heart failure, unspecified: Secondary | ICD-10-CM

## 2012-02-12 DIAGNOSIS — E785 Hyperlipidemia, unspecified: Secondary | ICD-10-CM

## 2012-02-12 DIAGNOSIS — I10 Essential (primary) hypertension: Secondary | ICD-10-CM

## 2012-02-12 DIAGNOSIS — Z23 Encounter for immunization: Secondary | ICD-10-CM

## 2012-02-12 NOTE — Progress Notes (Signed)
Subjective:    Patient ID: [name], male    DOB: 18-Dec-1950, 61 y.o.   MRN: [id number]  HPI  61 year old patient who is seen today for followup of his type 2 diabetes. He has done quite well. He has coronary artery disease treated hypertension and dyslipidemia. No cardiopulmonary complaints  Past Medical History  Diagnosis Date  . 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 Hyperlipidemia   . CVA (cerebral vascular accident) 2005    hx of right brain subcortical stroke  . 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 Pneumonia Feb 2008  . Diabetes mellitus type II   . Arthritis     joints,   . H/O hiatal hernia   . Radiculopathy     S1  . Hypertension     Dr. Maylon Cos cardiology  . Sinus congestion 06/19/11    pt has had sinus "infection" and just finished a z-pak  dr Clarisa Kindred aware    History   Social History  . Marital Status: Married    Spouse Name: N/A    Number of Children: N/A  . Years of Education: N/A   Occupational History  . Not on file.   Social History Main Topics  . Smoking status: Former Smoker -- 30 years    Quit date: 06/03/2004  . Smokeless tobacco: Never Used  . Alcohol Use: No     rarely  . Drug Use: No  . Sexually Active: Not on file   Other Topics Concern  . Not on file   Social History Narrative  . No narrative on file    Past Surgical History  Procedure Date  . Lymph node biospy   . Colonoscopy 2003  . 2-d echocardiogram April 2002    ejection fraction 55 to 65%  . Cataract extraction w/phaco 06/19/2011    Procedure: CATARACT EXTRACTION PHACO AND INTRAOCULAR LENS PLACEMENT (IOC);  Surgeon: Shade Flood, MD;  Location: [facility] OR;  Service: Ophthalmology;  Laterality: Right;  [place] FMA50BM +18.00 D Right    Family History  Problem Relation Age of Onset  . Diabetes Mother   . Heart disease Mother   . Heart failure Father   . Anesthesia problems Neg Hx      Allergies  Allergen Reactions  . Codeine Phosphate     REACTION: unspecified    Current Outpatient Prescriptions on File Prior to Visit  Medication Sig Dispense Refill  . ACCU-CHEK AVIVA PLUS test strip USE DAILY AS DIRECTED.  100 strip  1  . albuterol (VENTOLIN HFA) 108 (90 BASE) MCG/ACT inhaler Inhale 2 puffs into the lungs every 4 (four) hours as needed for wheezing or shortness of breath (cough).  1 Inhaler  1  . amLODipine (NORVASC) 10 MG tablet TAKE 1 TABLET EVERY DAY  90 tablet  4  . aspirin 325 MG tablet Take 325 mg by mouth daily.        Marland Kitchen atorvastatin (LIPITOR) 10 MG tablet Take 10 mg by mouth daily.       Marland Kitchen atorvastatin (LIPITOR) 10 MG tablet TAKE 1 TABLET EVERY DAY  90 tablet  3  . B-D UF III MINI PEN NEEDLES 31G X 5 MM MISC USE TWICE A DAY  100 each  5  . BYDUREON 2 MG SUSR INJECT 1 VIAL INTO THE SKIN ONCE A WEEK.  3 each  3  . Canagliflozin (INVOKANA) 300 MG TABS  Take 1 tablet by mouth 1 day or 1 dose.  90 tablet  4  . fexofenadine-pseudoephedrine (ALLEGRA-D) 60-120 MG per tablet Take 1 tablet by mouth daily as needed. For seasonal allergies      . fluticasone (FLONASE) 50 MCG/ACT nasal spray 2 SPRAYS IN EACH NOSTRIL EVERY DAY  16 g  4  . fosinopril (MONOPRIL) 20 MG tablet TAKE 1 TABLET TWICE A DAY  180 tablet  3  . glimepiride (AMARYL) 4 MG tablet Take 4 mg by mouth daily before breakfast.       . glimepiride (AMARYL) 4 MG tablet TAKE 1 TABLET TWICE A DAY  180 tablet  3  . glucose blood (ACCU-CHEK COMFORT CURVE) test strip 1 each by Other route daily. Use as instructed       . Lancets (ACCU-CHEK MULTICLIX) lancets USE AS DIRECTED  102 each  3  . LORazepam (ATIVAN) 0.5 MG tablet Take 0.5 mg by mouth 2 (two) times daily as needed. For anxiety      . metFORMIN (GLUCOPHAGE) 1000 MG tablet TAKE 1 TABLET TWICE A DAY  180 tablet  2  . metoprolol (LOPRESSOR) 50 MG tablet TAKE 1 TABLET EVERY DAY  90 tablet  3  . Multiple Vitamins-Minerals (CENTRUM SILVER PO) Take 1 tablet by  mouth daily.        . OMEGA 3 1200 MG CAPS Take 1,200 mg by mouth daily.      Bertram Gala Glycol-Propyl Glycol (SYSTANE ULTRA OP) Place 1-2 drops into both eyes every 2 (two) hours.      . potassium chloride SA (KLOR-CON M20) 20 MEQ tablet Take 1 tablet (20 mEq total) by mouth daily.  90 tablet  3  . temazepam (RESTORIL) 15 MG capsule Take 15 mg by mouth at bedtime as needed. For sleep      . terazosin (HYTRIN) 5 MG capsule TAKE ONE CAPSULE EVERY DAY  90 capsule  3    BP 120/72  Temp 97.9 F (36.6 C) (Oral)  Wt 165 lb (74.844 kg)       Review of Systems  Constitutional: Negative for fever, chills, appetite change and fatigue.  HENT: Negative for hearing loss, ear pain, congestion, sore throat, trouble swallowing, neck stiffness, dental problem, voice change and tinnitus.   Eyes: Negative for pain, discharge and visual disturbance.  Respiratory: Negative for cough, chest tightness, wheezing and stridor.   Cardiovascular: Negative for chest pain, palpitations and leg swelling.  Gastrointestinal: Negative for nausea, vomiting, abdominal pain, diarrhea, constipation, blood in stool and abdominal distention.  Genitourinary: Negative for urgency, hematuria, flank pain, discharge, difficulty urinating and genital sores.  Musculoskeletal: Negative for myalgias, back pain, joint swelling, arthralgias and gait problem.  Skin: Negative for rash.  Neurological: Negative for dizziness, syncope, speech difficulty, weakness, numbness and headaches.  Hematological: Negative for adenopathy. Does not bruise/bleed easily.  Psychiatric/Behavioral: Negative for behavioral problems and dysphoric mood. The patient is not nervous/anxious.        Objective:   Physical Exam  Constitutional: He is oriented to person, place, and time. He appears well-developed.       Weight 165 which is down 6 pounds. Blood pressure 120/72  HENT:  Head: Normocephalic.  Right Ear: External ear normal.  Left Ear: External  ear normal.  Eyes: Conjunctivae normal and EOM are normal.  Neck: Normal range of motion.  Cardiovascular: Normal rate and normal heart sounds.   Pulmonary/Chest: Breath sounds normal.  Abdominal: Bowel sounds are normal.  Musculoskeletal: Normal  range of motion. He exhibits no edema and no tenderness.  Neurological: He is alert and oriented to person, place, and time.  Psychiatric: He has a normal mood and affect. His behavior is normal.          Assessment & Plan:   Diabetes mellitus. Will check a hemoglobin A1c Hypertension stable Dyslipidemia History of cerebral vascular disease  On bone weight loss exercise encouraged. Recheck 3 months

## 2012-02-12 NOTE — Patient Instructions (Signed)
Limit your sodium (Salt) intake    It is important that you exercise regularly, at least 20 minutes 3 to 4 times per week.  If you develop chest pain or shortness of breath seek  medical attention.  You need to lose weight.  Consider a lower calorie diet and regular exercise.   Please check your hemoglobin A1c every 3 months   

## 2012-02-12 NOTE — Progress Notes (Signed)
  Subjective:    Patient ID: [name], male    DOB: 10-14-1950, 61 y.o.   MRN: [id number]  HPI  Wt Readings from Last 3 Encounters:  02/12/12 165 lb (74.844 kg)  11/12/11 171 lb (77.565 kg)  08/12/11 171 lb (77.565 kg)    Review of Systems     Objective:   Physical Exam        Assessment & Plan:

## 2012-03-10 ENCOUNTER — Other Ambulatory Visit: Payer: Self-pay | Admitting: Internal Medicine

## 2012-03-18 ENCOUNTER — Other Ambulatory Visit: Payer: Self-pay | Admitting: Physician Assistant

## 2012-03-19 NOTE — Telephone Encounter (Signed)
Please pull paper chart.  

## 2012-03-19 NOTE — Telephone Encounter (Signed)
Patients chart is at the nurses station in the pa pool pile.  [facility] [id number]

## 2012-05-04 ENCOUNTER — Other Ambulatory Visit: Payer: Self-pay | Admitting: Specialist

## 2012-05-04 DIAGNOSIS — M25561 Pain in right knee: Secondary | ICD-10-CM

## 2012-05-14 ENCOUNTER — Encounter: Payer: Self-pay | Admitting: Internal Medicine

## 2012-05-14 ENCOUNTER — Ambulatory Visit (INDEPENDENT_AMBULATORY_CARE_PROVIDER_SITE_OTHER): Payer: 59 | Admitting: Internal Medicine

## 2012-05-14 VITALS — BP 140/80 | HR 92 | Temp 98.1°F | Resp 18 | Wt 165.0 lb

## 2012-05-14 DIAGNOSIS — E785 Hyperlipidemia, unspecified: Secondary | ICD-10-CM

## 2012-05-14 DIAGNOSIS — I10 Essential (primary) hypertension: Secondary | ICD-10-CM

## 2012-05-14 DIAGNOSIS — E119 Type 2 diabetes mellitus without complications: Secondary | ICD-10-CM

## 2012-05-14 LAB — HEMOGLOBIN A1C: Hgb A1c MFr Bld: 7.5 % — ABNORMAL HIGH (ref 4.6–6.5)

## 2012-05-14 NOTE — Patient Instructions (Addendum)
Please check your hemoglobin A1c every 3 months  Limit your sodium (Salt) intake    It is important that you exercise regularly, at least 20 minutes 3 to 4 times per week.  If you develop chest pain or shortness of breath seek  medical attention.  You need to lose weight.  Consider a lower calorie diet and regular exercise. 

## 2012-05-14 NOTE — Progress Notes (Signed)
Subjective:    Patient ID: [name], male    DOB: Sep 11, 1950, 61 y.o.   MRN: [id number]  HPI  61 year old patient who is seen today for followup of type 2 diabetes. He has a history of hypertension dyslipidemia and remote history congestive heart failure. He is doing quite well. Fasting blood sugar today 96. Weight stable at 165. No history of hypoglycemia.  Wt Readings from Last 3 Encounters:  05/14/12 165 lb (74.844 kg)  02/12/12 165 lb (74.844 kg)  11/12/11 171 lb (77.565 kg)   Past Medical History  Diagnosis Date  . 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 Hyperlipidemia   . CVA (cerebral vascular accident) 2005    hx of right brain subcortical stroke  . 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 Pneumonia Feb 2008  . Diabetes mellitus type II   . Arthritis     joints,   . H/O hiatal hernia   . Radiculopathy     S1  . Hypertension     Dr. Maylon Cos cardiology  . Sinus congestion 06/19/11    pt has had sinus "infection" and just finished a z-pak  dr Clarisa Kindred aware    History   Social History  . Marital Status: Married    Spouse Name: N/A    Number of Children: N/A  . Years of Education: N/A   Occupational History  . Not on file.   Social History Main Topics  . Smoking status: Former Smoker -- 30 years    Quit date: 06/03/2004  . Smokeless tobacco: Never Used  . Alcohol Use: No     Comment: rarely  . Drug Use: No  . Sexually Active: Not on file   Other Topics Concern  . Not on file   Social History Narrative  . No narrative on file    Past Surgical History  Procedure Date  . Lymph node biospy   . Colonoscopy 2003  . 2-d echocardiogram April 2002    ejection fraction 55 to 65%  . Cataract extraction w/phaco 06/19/2011    Procedure: CATARACT EXTRACTION PHACO AND INTRAOCULAR LENS PLACEMENT (IOC);  Surgeon: Shade Flood, MD;  Location: [facility] OR;  Service: Ophthalmology;  Laterality:  Right;  [place] FMA50BM +18.00 D Right    Family History  Problem Relation Age of Onset  . Diabetes Mother   . Heart disease Mother   . Heart failure Father   . Anesthesia problems Neg Hx     Allergies  Allergen Reactions  . Codeine Phosphate     REACTION: unspecified    Current Outpatient Prescriptions on File Prior to Visit  Medication Sig Dispense Refill  . ACCU-CHEK AVIVA PLUS test strip USE DAILY AS DIRECTED.  100 strip  1  . amLODipine (NORVASC) 10 MG tablet TAKE 1 TABLET EVERY DAY  90 tablet  4  . aspirin 325 MG tablet Take 325 mg by mouth daily.        Marland Kitchen atorvastatin (LIPITOR) 10 MG tablet Take 10 mg by mouth daily.       Marland Kitchen atorvastatin (LIPITOR) 10 MG tablet TAKE 1 TABLET EVERY DAY  90 tablet  3  . B-D UF III MINI PEN NEEDLES 31G X 5 MM MISC USE TWICE A DAY  100 each  5  . BYDUREON 2 MG SUSR INJECT 1 VIAL INTO THE SKIN ONCE A WEEK.  3 each  3  .  fexofenadine-pseudoephedrine (ALLEGRA-D) 60-120 MG per tablet Take 1 tablet by mouth daily as needed. For seasonal allergies      . fluticasone (FLONASE) 50 MCG/ACT nasal spray 2 SPRAYS IN EACH NOSTRIL EVERY DAY  16 g  4  . fosinopril (MONOPRIL) 20 MG tablet TAKE 1 TABLET TWICE A DAY  180 tablet  3  . glimepiride (AMARYL) 4 MG tablet Take 4 mg by mouth daily before breakfast.       . glimepiride (AMARYL) 4 MG tablet TAKE 1 TABLET TWICE A DAY  180 tablet  3  . glucose blood (ACCU-CHEK COMFORT CURVE) test strip 1 each by Other route daily. Use as instructed       . Lancets (ACCU-CHEK MULTICLIX) lancets 1 each by Other route daily.  102 each  2  . LORazepam (ATIVAN) 0.5 MG tablet Take 0.5 mg by mouth 2 (two) times daily as needed. For anxiety      . metFORMIN (GLUCOPHAGE) 1000 MG tablet TAKE 1 TABLET TWICE A DAY  180 tablet  2  . metoprolol (LOPRESSOR) 50 MG tablet TAKE 1 TABLET EVERY DAY  90 tablet  3  . Multiple Vitamins-Minerals (CENTRUM SILVER PO) Take 1 tablet by mouth daily.        . OMEGA 3 1200 MG CAPS Take 1,200 mg by  mouth daily.      Bertram Gala Glycol-Propyl Glycol (SYSTANE ULTRA OP) Place 1-2 drops into both eyes every 2 (two) hours.      . potassium chloride SA (KLOR-CON M20) 20 MEQ tablet Take 1 tablet (20 mEq total) by mouth daily.  90 tablet  3  . temazepam (RESTORIL) 15 MG capsule Take 15 mg by mouth at bedtime as needed. For sleep      . terazosin (HYTRIN) 5 MG capsule TAKE ONE CAPSULE EVERY DAY  90 capsule  3  . VENTOLIN HFA 108 (90 BASE) MCG/ACT inhaler INHALE 2 PUFFS EVERY 4 HOURS AS NEEDED FOR WHEEZING OR SHORTNESS OF BREATH (COUGH)  18 each  0    BP 140/80  Pulse 92  Temp 98.1 F (36.7 C) (Oral)  Resp 18  Wt 165 lb (74.844 kg)  SpO2 96%    Review of Systems  Constitutional: Negative for fever, chills, appetite change and fatigue.  HENT: Negative for hearing loss, ear pain, congestion, sore throat, trouble swallowing, neck stiffness, dental problem, voice change and tinnitus.   Eyes: Negative for pain, discharge and visual disturbance.  Respiratory: Negative for cough, chest tightness, wheezing and stridor.   Cardiovascular: Negative for chest pain, palpitations and leg swelling.  Gastrointestinal: Negative for nausea, vomiting, abdominal pain, diarrhea, constipation, blood in stool and abdominal distention.  Genitourinary: Negative for urgency, hematuria, flank pain, discharge, difficulty urinating and genital sores.  Musculoskeletal: Negative for myalgias, back pain, joint swelling, arthralgias and gait problem.  Skin: Negative for rash.  Neurological: Negative for dizziness, syncope, speech difficulty, weakness, numbness and headaches.  Hematological: Negative for adenopathy. Does not bruise/bleed easily.  Psychiatric/Behavioral: Negative for behavioral problems and dysphoric mood. The patient is not nervous/anxious.        Objective:   Physical Exam  Constitutional: He is oriented to person, place, and time. He appears well-developed.  HENT:  Head: Normocephalic.  Right  Ear: External ear normal.  Left Ear: External ear normal.  Eyes: Conjunctivae normal and EOM are normal.  Neck: Normal range of motion.  Cardiovascular: Normal rate and normal heart sounds.   Pulmonary/Chest: Breath sounds normal.  Abdominal: Bowel sounds  are normal.  Musculoskeletal: Normal range of motion. He exhibits no edema and no tenderness.  Neurological: He is alert and oriented to person, place, and time.  Psychiatric: He has a normal mood and affect. His behavior is normal.          Assessment & Plan:    Diabetes mellitus. Will check a hemoglobin A1c Hypertension stable Dyslipidemia stable  The change in medications Check hemoglobin A1c recheck 3 months

## 2012-06-04 ENCOUNTER — Other Ambulatory Visit: Payer: Self-pay | Admitting: Internal Medicine

## 2012-06-11 ENCOUNTER — Other Ambulatory Visit (HOSPITAL_BASED_OUTPATIENT_CLINIC_OR_DEPARTMENT_OTHER): Payer: 59 | Admitting: Lab

## 2012-06-11 ENCOUNTER — Ambulatory Visit (HOSPITAL_BASED_OUTPATIENT_CLINIC_OR_DEPARTMENT_OTHER): Payer: 59 | Admitting: Oncology

## 2012-06-11 ENCOUNTER — Telehealth: Payer: Self-pay | Admitting: Oncology

## 2012-06-11 VITALS — BP 145/60 | HR 95 | Temp 97.9°F | Resp 20 | Ht 66.0 in | Wt 166.0 lb

## 2012-06-11 DIAGNOSIS — C8190 Hodgkin lymphoma, unspecified, unspecified site: Secondary | ICD-10-CM

## 2012-06-11 DIAGNOSIS — D47Z9 Other specified neoplasms of uncertain behavior of lymphoid, hematopoietic and related tissue: Secondary | ICD-10-CM

## 2012-06-11 LAB — CBC WITH DIFFERENTIAL/PLATELET
BASO%: 1 % (ref 0.0–2.0)
EOS%: 3.5 % (ref 0.0–7.0)
HGB: 14.2 g/dL (ref 13.0–17.1)
MCH: 33.6 pg — ABNORMAL HIGH (ref 27.2–33.4)
MCHC: 34.3 g/dL (ref 32.0–36.0)
MCV: 97.9 fL (ref 79.3–98.0)
MONO%: 15.3 % — ABNORMAL HIGH (ref 0.0–14.0)
RBC: 4.23 10*6/uL (ref 4.20–5.82)
RDW: 12.4 % (ref 11.0–14.6)
lymph#: 1.4 10*3/uL (ref 0.9–3.3)

## 2012-06-11 NOTE — Telephone Encounter (Signed)
gv and printed appt schedule for pt for [name] 2015 °

## 2012-06-11 NOTE — Progress Notes (Signed)
   [facility]    OFFICE PROGRESS NOTE   INTERVAL HISTORY:   He returns as scheduled. He continues to have erythematous skin lesions intermittently. These occur approximately every 3-4 months in last for 1-1.5 half months. He does not have an active lesion at present. He has occasional upper body sweats at night. He attributes this to diabetes. Good appetite and energy level. No other complaint.  Objective:  Vital signs in last 24 hours:  Blood pressure 145/60, pulse 95, temperature 97.9 F (36.6 C), temperature source Oral, resp. rate 20, height 5\' 6"  (1.676 m), weight 166 lb (75.297 kg).    HEENT: Neck without mass Lymphatics: No cervical, suprapatellar, axillary, or inguinal node Resp: Distant breath sounds. No respiratory distress Cardio: Distant heart sounds, regular rate and rhythm GI: No hepatosplenomegaly Vascular: No leg edema  Skin: No nodular skin lesions are apparent   Portacath/PICC-without erythema  Lab Results:  Lab Results  Component Value Date   WBC 4.9 06/11/2012   HGB 14.2 06/11/2012   HCT 41.4 06/11/2012   MCV 97.9 06/11/2012   PLT 290 06/11/2012   ANC 2.5   Medications: I have reviewed the patient's current medications.  Assessment/Plan: 1. Hodgkins lymphoma , stage IV, diagnosed in May 2000 - he remains in clinical remission. 2. History of a nodular skin rash with a biopsy August 02, 2006, confirming a CD30 positive lymphoproliferative disorder.  He reports intermittent nodular skin lesions over the past few years that spontaneously resolve and occur months apart.  No apparent lesions today.  3. Mild anemia at the [facility] May 17, 2010 - the hemoglobin is normal today.   Disposition:  He remains in clinical remission from Hodgkin's lymphoma. He will contact us if he develops a skin lesion that does not resolve or for new symptoms. Mr. Troublefield will return for an office visit in one year.   Thornton Papas,  MD  06/11/2012  12:37 PM

## 2012-06-14 ENCOUNTER — Other Ambulatory Visit: Payer: Self-pay | Admitting: Internal Medicine

## 2012-08-01 ENCOUNTER — Other Ambulatory Visit: Payer: Self-pay | Admitting: Internal Medicine

## 2012-08-02 ENCOUNTER — Other Ambulatory Visit: Payer: Self-pay | Admitting: Physician Assistant

## 2012-08-12 ENCOUNTER — Encounter: Payer: Self-pay | Admitting: Internal Medicine

## 2012-08-12 ENCOUNTER — Ambulatory Visit (INDEPENDENT_AMBULATORY_CARE_PROVIDER_SITE_OTHER): Payer: 59 | Admitting: Internal Medicine

## 2012-08-12 VITALS — BP 156/80 | HR 95 | Temp 97.7°F | Resp 20 | Wt 168.0 lb

## 2012-08-12 DIAGNOSIS — E785 Hyperlipidemia, unspecified: Secondary | ICD-10-CM

## 2012-08-12 DIAGNOSIS — I10 Essential (primary) hypertension: Secondary | ICD-10-CM

## 2012-08-12 DIAGNOSIS — I509 Heart failure, unspecified: Secondary | ICD-10-CM

## 2012-08-12 DIAGNOSIS — E119 Type 2 diabetes mellitus without complications: Secondary | ICD-10-CM

## 2012-08-12 LAB — HEMOGLOBIN A1C: Hgb A1c MFr Bld: 7.7 % — ABNORMAL HIGH (ref 4.6–6.5)

## 2012-08-12 MED ORDER — LORAZEPAM 0.5 MG PO TABS: 1.0000 mg | ORAL_TABLET | Freq: Two times a day (BID) | ORAL | Status: DC | PRN

## 2012-08-12 NOTE — Progress Notes (Signed)
Subjective:    Patient ID: [name], male    DOB: 02/23/1951, 62 y.o.   MRN: [id number]  HPI  Wt Readings from Last 3 Encounters:  08/12/12 168 lb (76.204 kg)  06/11/12 166 lb (75.297 kg)  05/14/12 165 lb (74.51 kg)   62 year old patient who is seen today for followup. He has a history of type 2 diabetes. Unfortunately there is been some modest weight gain. Hemoglobin A1c 6 months ago was 7 in 3 months ago had increased to 7.7. He generally feels well today except for some mild allergy related symptoms. He has been followed closely by ophthalmology and has had right-sided ptosis that has complicated cataract surgery  Past Medical History  Diagnosis Date  . 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 Hyperlipidemia   . CVA (cerebral vascular accident) 2005    hx of right brain subcortical stroke  . 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 Pneumonia Feb 2008  . Diabetes mellitus type II   . Arthritis     joints,   . H/O hiatal hernia   . Radiculopathy     S1  . Hypertension     Dr. Maylon Cos cardiology  . Sinus congestion 06/19/11    pt has had sinus "infection" and just finished a z-pak  dr Clarisa Kindred aware    History   Social History  . Marital Status: Married    Spouse Name: N/A    Number of Children: N/A  . Years of Education: N/A   Occupational History  . Not on file.   Social History Main Topics  . Smoking status: Former Smoker -- 30 years    Quit date: 06/03/2004  . Smokeless tobacco: Never Used  . Alcohol Use: No     Comment: rarely  . Drug Use: No  . Sexually Active: Not on file   Other Topics Concern  . Not on file   Social History Narrative  . No narrative on file    Past Surgical History  Procedure Laterality Date  . Lymph node biospy    . Colonoscopy  2003  . 2-d echocardiogram  April 2002    ejection fraction 55 to 65%  . Cataract extraction w/phaco  06/19/2011   Procedure: CATARACT EXTRACTION PHACO AND INTRAOCULAR LENS PLACEMENT (IOC);  Surgeon: Shade Flood, MD;  Location: [facility] OR;  Service: Ophthalmology;  Laterality: Right;  [place] FMA50BM +18.00 D Right    Family History  Problem Relation Age of Onset  . Diabetes Mother   . Heart disease Mother   . Heart failure Father   . Anesthesia problems Neg Hx     Allergies  Allergen Reactions  . Codeine Phosphate     REACTION: unspecified    Current Outpatient Prescriptions on File Prior to Visit  Medication Sig Dispense Refill  . ACCU-CHEK AVIVA PLUS test strip USE DAILY AS DIRECTED.  100 strip  1  . albuterol (VENTOLIN HFA) 108 (90 BASE) MCG/ACT inhaler Inhale 2 puffs into the lungs every 4 (four) hours as needed for wheezing. Needs office visit for further refills  1 Inhaler  0  . amLODipine (NORVASC) 10 MG tablet TAKE 1 TABLET EVERY DAY  90 tablet  4  . aspirin 325 MG tablet Take 325 mg by mouth daily.        Marland Kitchen atorvastatin (LIPITOR) 10 MG tablet TAKE 1 TABLET EVERY DAY  90 tablet  3  . B-D UF III MINI PEN NEEDLES 31G X 5 MM MISC USE TWICE A DAY  100 each  5  . BYDUREON 2 MG SUSR INJECT 1 VIAL INTO THE SKIN ONCE A WEEK.  3 each  3  . fexofenadine-pseudoephedrine (ALLEGRA-D) 60-120 MG per tablet Take 1 tablet by mouth daily as needed. For seasonal allergies      . fluticasone (FLONASE) 50 MCG/ACT nasal spray 2 SPRAYS IN EACH NOSTRIL EVERY DAY  16 g  4  . fosinopril (MONOPRIL) 20 MG tablet TAKE 1 TABLET TWICE A DAY  180 tablet  3  . glimepiride (AMARYL) 4 MG tablet TAKE 1 TABLET TWICE A DAY  180 tablet  3  . glucose blood (ACCU-CHEK COMFORT CURVE) test strip 1 each by Other route daily. Use as instructed       . KLOR-CON M20 20 MEQ tablet TAKE 1 TABLET BY MOUTH ONCE DAILY  90 tablet  3  . Lancets (ACCU-CHEK MULTICLIX) lancets 1 each by Other route daily.  102 each  2  . metFORMIN (GLUCOPHAGE) 1000 MG tablet TAKE 1 TABLET TWICE A DAY  180 tablet  2  . metoprolol (LOPRESSOR) 50 MG tablet  TAKE 1 TABLET EVERY DAY  90 tablet  3  . Multiple Vitamins-Minerals (CENTRUM SILVER PO) Take 1 tablet by mouth daily.        . OMEGA 3 1200 MG CAPS Take 1,200 mg by mouth daily.      Bertram Gala Glycol-Propyl Glycol (SYSTANE ULTRA OP) Place 1-2 drops into both eyes every 2 (two) hours.      Marland Kitchen terazosin (HYTRIN) 5 MG capsule TAKE ONE CAPSULE EVERY DAY  90 capsule  3   No current facility-administered medications on file prior to visit.    BP 156/80  Pulse 95  Temp(Src) 97.7 F (36.5 C) (Oral)  Resp 20  Wt 168 lb (76.204 kg)  BMI 27.13 kg/m2  SpO2 95%      Review of Systems  Constitutional: Negative for fever, chills, appetite change and fatigue.  HENT: Negative for hearing loss, ear pain, congestion, sore throat, trouble swallowing, neck stiffness, dental problem, voice change and tinnitus.   Eyes: Negative for pain, discharge and visual disturbance.  Respiratory: Negative for cough, chest tightness, wheezing and stridor.   Cardiovascular: Negative for chest pain, palpitations and leg swelling.  Gastrointestinal: Negative for nausea, vomiting, abdominal pain, diarrhea, constipation, blood in stool and abdominal distention.  Genitourinary: Negative for urgency, hematuria, flank pain, discharge, difficulty urinating and genital sores.  Musculoskeletal: Negative for myalgias, back pain, joint swelling, arthralgias and gait problem.  Skin: Negative for rash.  Neurological: Negative for dizziness, syncope, speech difficulty, weakness, numbness and headaches.  Hematological: Negative for adenopathy. Does not bruise/bleed easily.  Psychiatric/Behavioral: Negative for behavioral problems and dysphoric mood. The patient is not nervous/anxious.        Objective:   Physical Exam  Constitutional: He is oriented to person, place, and time. He appears well-developed.  HENT:  Head: Normocephalic.  Right Ear: External ear normal.  Left Ear: External ear normal.  Eyes: Conjunctivae and EOM  are normal.  Neck: Normal range of motion.  Cardiovascular: Normal rate and normal heart sounds.   Pulmonary/Chest: Breath sounds normal.  Abdominal: Bowel sounds are normal.  Musculoskeletal: Normal range of motion. He exhibits no edema and no tenderness.  Neurological: He is alert and oriented to person, place, and time.  Psychiatric: He has a normal mood and affect. His behavior is  normal.          Assessment & Plan:   Diabetes mellitus type 2 Hypertension stable. Repeat blood pressure 130/78 Dyslipidemia History congestive heart failure stable  We'll check a hemoglobin A1c; lifestyle issues discussed and addressed

## 2012-08-12 NOTE — Patient Instructions (Signed)
Limit your sodium (Salt) intake  You need to lose weight.  Consider a lower calorie diet and regular exercise.    It is important that you exercise regularly, at least 20 minutes 3 to 4 times per week.  If you develop chest pain or shortness of breath seek  medical attention.   Please check your hemoglobin A1c every 3 months   

## 2012-08-13 ENCOUNTER — Telehealth: Payer: Self-pay | Admitting: Internal Medicine

## 2012-08-13 NOTE — Telephone Encounter (Signed)
Caller: Larnie/Patient; Phone: [phone]; Reason for Call: Returning a call from Lupita Leash in the office. Reviewed EPIC. This was in regards to A1C results. Advised caller I would forward message to the office and have Lupita Leash return his call. Please contact patient [phone]

## 2012-08-14 NOTE — Telephone Encounter (Signed)
Pt aware, see other message

## 2012-10-06 ENCOUNTER — Other Ambulatory Visit: Payer: Self-pay | Admitting: Internal Medicine

## 2012-11-09 ENCOUNTER — Other Ambulatory Visit: Payer: Self-pay | Admitting: Internal Medicine

## 2012-11-11 ENCOUNTER — Other Ambulatory Visit: Payer: Self-pay | Admitting: Internal Medicine

## 2012-11-12 ENCOUNTER — Encounter: Payer: Self-pay | Admitting: Internal Medicine

## 2012-11-12 ENCOUNTER — Ambulatory Visit (INDEPENDENT_AMBULATORY_CARE_PROVIDER_SITE_OTHER): Payer: 59 | Admitting: Internal Medicine

## 2012-11-12 VITALS — BP 140/70 | HR 94 | Temp 98.0°F | Resp 20 | Wt 164.0 lb

## 2012-11-12 DIAGNOSIS — E119 Type 2 diabetes mellitus without complications: Secondary | ICD-10-CM

## 2012-11-12 DIAGNOSIS — I509 Heart failure, unspecified: Secondary | ICD-10-CM

## 2012-11-12 DIAGNOSIS — E785 Hyperlipidemia, unspecified: Secondary | ICD-10-CM

## 2012-11-12 DIAGNOSIS — I10 Essential (primary) hypertension: Secondary | ICD-10-CM

## 2012-11-12 DIAGNOSIS — Z8679 Personal history of other diseases of the circulatory system: Secondary | ICD-10-CM

## 2012-11-12 MED ORDER — DAPAGLIFLOZIN PROPANEDIOL 10 MG PO TABS: 1.0000 | ORAL_TABLET | Freq: Every day | ORAL | Status: DC

## 2012-11-12 NOTE — Progress Notes (Signed)
Subjective:    Patient ID: [name], male    DOB: 1950-08-24, 62 y.o.   MRN: [id number]  HPI  62 year old patient who is seen today for followup of his type 2 diabetes. His last hemoglobin A1c had increased to 7.7. It had been trending up. He feels well today and feels his glycemic control has improved. There has been some modest weight loss. History of hypertension which has done well. He has dyslipidemia and remains on atorvastatin. He does complain of some night cramps that usually are early morning prior to arising from bed. He has a history of congestive heart failure which has been stable  Past Medical History  Diagnosis Date  . 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 Hyperlipidemia   . CVA (cerebral vascular accident) 2005    hx of right brain subcortical stroke  . 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 Pneumonia Feb 2008  . Diabetes mellitus type II   . Arthritis     joints,   . H/O hiatal hernia   . Radiculopathy     S1  . Hypertension     Dr. Maylon Cos cardiology  . Sinus congestion 06/19/11    pt has had sinus "infection" and just finished a z-pak  dr Clarisa Kindred aware    History   Social History  . Marital Status: Married    Spouse Name: N/A    Number of Children: N/A  . Years of Education: N/A   Occupational History  . Not on file.   Social History Main Topics  . Smoking status: Former Smoker -- 30 years    Quit date: 06/03/2004  . Smokeless tobacco: Never Used  . Alcohol Use: No     Comment: rarely  . Drug Use: No  . Sexually Active: Not on file   Other Topics Concern  . Not on file   Social History Narrative  . No narrative on file    Past Surgical History  Procedure Laterality Date  . Lymph node biospy    . Colonoscopy  2003  . 2-d echocardiogram  April 2002    ejection fraction 55 to 65%  . Cataract extraction w/phaco  06/19/2011    Procedure: CATARACT  EXTRACTION PHACO AND INTRAOCULAR LENS PLACEMENT (IOC);  Surgeon: Shade Flood, MD;  Location: [facility] OR;  Service: Ophthalmology;  Laterality: Right;  [place] FMA50BM +18.00 D Right    Family History  Problem Relation Age of Onset  . Diabetes Mother   . Heart disease Mother   . Heart failure Father   . Anesthesia problems Neg Hx     Allergies  Allergen Reactions  . Codeine Phosphate     REACTION: unspecified    Current Outpatient Prescriptions on File Prior to Visit  Medication Sig Dispense Refill  . ACCU-CHEK AVIVA PLUS test strip USE DAILY AS DIRECTED.  100 strip  1  . albuterol (VENTOLIN HFA) 108 (90 BASE) MCG/ACT inhaler Inhale 2 puffs into the lungs every 4 (four) hours as needed for wheezing. Needs office visit for further refills  1 Inhaler  0  . amLODipine (NORVASC) 10 MG tablet TAKE 1 TABLET EVERY DAY  90 tablet  4  . aspirin 325 MG tablet Take 325 mg by mouth daily.        Marland Kitchen atorvastatin (LIPITOR) 10 MG tablet TAKE 1 TABLET EVERY DAY  90 tablet  3  . B-D  UF III MINI PEN NEEDLES 31G X 5 MM MISC USE TWICE A DAY  100 each  5  . BYDUREON 2 MG SUSR INJECT INTO THE SKIN ONCE A WEEK.  12 mg  3  . fexofenadine-pseudoephedrine (ALLEGRA-D) 60-120 MG per tablet Take 1 tablet by mouth daily as needed. For seasonal allergies      . fluticasone (FLONASE) 50 MCG/ACT nasal spray 2 SPRAYS IN EACH NOSTRIL EVERY DAY  16 g  4  . fosinopril (MONOPRIL) 20 MG tablet TAKE 1 TABLET TWICE A DAY  180 tablet  3  . glimepiride (AMARYL) 4 MG tablet TAKE 1 TABLET TWICE A DAY  180 tablet  1  . glucose blood (ACCU-CHEK COMFORT CURVE) test strip 1 each by Other route daily. Use as instructed       . KLOR-CON M20 20 MEQ tablet TAKE 1 TABLET BY MOUTH ONCE DAILY  90 tablet  3  . Lancets (ACCU-CHEK MULTICLIX) lancets 1 each by Other route daily.  102 each  2  . LORazepam (ATIVAN) 0.5 MG tablet Take 2 tablets (1 mg total) by mouth 2 (two) times daily as needed. For anxiety  30 tablet  1  . metFORMIN  (GLUCOPHAGE) 1000 MG tablet TAKE 1 TABLET TWICE A DAY  180 tablet  2  . metoprolol (LOPRESSOR) 50 MG tablet TAKE 1 TABLET EVERY DAY  90 tablet  3  . Multiple Vitamins-Minerals (CENTRUM SILVER PO) Take 1 tablet by mouth daily.        . OMEGA 3 1200 MG CAPS Take 1,200 mg by mouth daily.      Bertram Gala Glycol-Propyl Glycol (SYSTANE ULTRA OP) Place 1-2 drops into both eyes every 2 (two) hours.      Marland Kitchen terazosin (HYTRIN) 5 MG capsule TAKE ONE CAPSULE EVERY DAY  90 capsule  3   No current facility-administered medications on file prior to visit.    BP 140/70  Pulse 94  Temp(Src) 98 F (36.7 C) (Oral)  Resp 20  Wt 164 lb (74.39 kg)  BMI 26.48 kg/m2  SpO2 96%       Review of Systems  Constitutional: Negative for fever, chills, appetite change and fatigue.  HENT: Negative for hearing loss, ear pain, congestion, sore throat, trouble swallowing, neck stiffness, dental problem, voice change and tinnitus.   Eyes: Negative for pain, discharge and visual disturbance.  Respiratory: Negative for cough, chest tightness, wheezing and stridor.   Cardiovascular: Negative for chest pain, palpitations and leg swelling.  Gastrointestinal: Negative for nausea, vomiting, abdominal pain, diarrhea, constipation, blood in stool and abdominal distention.  Genitourinary: Negative for urgency, hematuria, flank pain, discharge, difficulty urinating and genital sores.  Musculoskeletal: Negative for myalgias, back pain, joint swelling, arthralgias and gait problem.  Skin: Negative for rash.  Neurological: Negative for dizziness, syncope, speech difficulty, weakness, numbness and headaches.  Hematological: Negative for adenopathy. Does not bruise/bleed easily.  Psychiatric/Behavioral: Negative for behavioral problems and dysphoric mood. The patient is not nervous/anxious.        Objective:   Physical Exam  Constitutional: He is oriented to person, place, and time. He appears well-developed.  HENT:  Head:  Normocephalic.  Right Ear: External ear normal.  Left Ear: External ear normal.  Eyes: Conjunctivae and EOM are normal.  Neck: Normal range of motion.  Cardiovascular: Normal rate and normal heart sounds.   Pulmonary/Chest: Breath sounds normal.  Abdominal: Bowel sounds are normal.  Musculoskeletal: Normal range of motion. He exhibits no edema and no tenderness.  Neurological: He is alert and oriented to person, place, and time.  Psychiatric: He has a normal mood and affect. His behavior is normal.          Assessment & Plan:  Diabetes mellitus. Will check a hemoglobin A1c Hypertension stable Dyslipidemia. Continue atorvastatin  Will place on farxiga 10 mg daily

## 2012-11-12 NOTE — Patient Instructions (Signed)
Limit your sodium (Salt) intake   Please check your hemoglobin A1c every 3 months    It is important that you exercise regularly, at least 20 minutes 3 to 4 times per week.  If you develop chest pain or shortness of breath seek  medical attention.  You need to lose weight.  Consider a lower calorie diet and regular exercise. 

## 2012-11-12 NOTE — Progress Notes (Signed)
  Subjective:    Patient ID: [name], male    DOB: 1951-03-02, 62 y.o.   MRN: [id number]  HPI  Wt Readings from Last 3 Encounters:  11/12/12 164 lb (74.39 kg)  08/12/12 168 lb (76.204 kg)  06/11/12 166 lb (75.297 kg)    Lab Results  Component Value Date   HGBA1C 7.7* 08/12/2012    Review of Systems     Objective:   Physical Exam        Assessment & Plan:

## 2012-12-04 ENCOUNTER — Other Ambulatory Visit: Payer: Self-pay | Admitting: Internal Medicine

## 2012-12-09 ENCOUNTER — Other Ambulatory Visit: Payer: Self-pay | Admitting: Internal Medicine

## 2012-12-11 ENCOUNTER — Ambulatory Visit: Payer: 59 | Admitting: Family Medicine

## 2012-12-11 ENCOUNTER — Telehealth: Payer: Self-pay | Admitting: Internal Medicine

## 2012-12-11 NOTE — Telephone Encounter (Signed)
Patient Information:  Caller Name: [place]  Phone: [phone]  Patient: [name], [name]  Gender: Male  DOB: 21-Jun-1950  Age: 62 Years  PCP: Eleonore Chiqu[facility])  Office Follow Up:  Does the office need to follow up with this patient?: Yes  Instructions For The Office: OFFICE PLEASE CALL PT FOR A POSSIBLE WORK IN APPT FOR TODAY- ALL APPT FULL- DR BURCHETTE HAD OPENING AT 4:30PM BUT SCHEDULE STATED THAT HE REACHED HIS MAXIMUM FOR TODAY  PT AWARE THAT OFFICE WILL CALL HIM BACK FOR POSSIBLE APPT- INSTRUCTED TO CALL BACK IF SX WORSEN OR DOESN'T HEAR FROM OFFICE IN THE NEXT 1-2 HOURS; WILL COMPLY   Symptoms  Reason For Call & Symptoms: Pt is calling and states that he thinks that he is having a yeast infection from  [place]; this medication was started 1 month ago; sx started 12/04/12; sx include soreness around head of penis;  redness noted to tip of penis;  burning with urination  Reviewed Health History In EMR: Yes  Reviewed Medications In EMR: Yes  Reviewed Allergies In EMR: Yes  Reviewed Surgeries / Procedures: Yes  Date of Onset of Symptoms: 12/04/2012  Guideline(s) Used:  Penis and Scrotum Symptoms  Disposition Per Guideline:   See Today in Office  Reason For Disposition Reached:   Pain or burning with passing urine  Advice Given:  Call Back If:  You become worse.  Patient Will Follow Care Advice:  YES

## 2012-12-11 NOTE — Telephone Encounter (Signed)
Spoke to pt told him can see Dr. Caryl Never today at 4:30. Pt stated he told them he wanted to wait till Monday to see Dr. Kirtland Bouchard. Told pt you should be seen due to pain with urination and burning. Pt stated it is not that bad and he prefers to wait till Monday. Told pt okay but if he develops severe pain or passes blood needs to go to Urgent care. Pt verbalized understanding. Transferred to [facility] to schedule appt for Monday.

## 2012-12-14 ENCOUNTER — Encounter: Payer: Self-pay | Admitting: Internal Medicine

## 2012-12-14 ENCOUNTER — Ambulatory Visit (INDEPENDENT_AMBULATORY_CARE_PROVIDER_SITE_OTHER): Payer: 59 | Admitting: Internal Medicine

## 2012-12-14 VITALS — BP 140/80 | HR 100 | Temp 98.3°F | Resp 20 | Wt 162.0 lb

## 2012-12-14 DIAGNOSIS — N476 Balanoposthitis: Secondary | ICD-10-CM

## 2012-12-14 DIAGNOSIS — N481 Balanitis: Secondary | ICD-10-CM

## 2012-12-14 DIAGNOSIS — E119 Type 2 diabetes mellitus without complications: Secondary | ICD-10-CM

## 2012-12-14 DIAGNOSIS — I10 Essential (primary) hypertension: Secondary | ICD-10-CM

## 2012-12-14 MED ORDER — CLOTRIMAZOLE 1 % EX CREA: TOPICAL_CREAM | Freq: Two times a day (BID) | CUTANEOUS | Status: AC

## 2012-12-14 MED ORDER — DAPAGLIFLOZIN PROPANEDIOL 5 MG PO TABS: 1.0000 | ORAL_TABLET | Freq: Every day | ORAL | Status: DC

## 2012-12-14 NOTE — Patient Instructions (Signed)
Balanitis and Foreskin Hygiene  Balanitis is a soreness and redness (inflammation) of the head (glans) of the penis. Sometimes there is a discharge, and there may be a mild itch or discomfort.  CAUSES   · Balanitis is an overgrowth of organisms (such as bacteria or yeast) which are normally present on the skin of the glans.  · The condition most most often occurs in men who have a foreskin (have not been circumcised). This provides a warm, moist area for these organisms to grow.  · When these organisms overgrow or multiply, they cause inflammation. This is more likely to occur with poor hygiene.  · One common organism associated with balanitis is yeast. This yeast is known as Candida albicans. Balanitis may occur because of excessive growth of Candida, due to moisture and warmth under the foreskin.  · Treatment of balanitis is usually done by keeping the glans and foreskin clean and dry. Medications usually do not work as well as good hygiene.  HOME CARE INSTRUCTIONS   · Once a day, ideally when you shower or bathe, pull the foreskin back towards the body until the glans is uncovered. If there is resistance or discomfort with pulling the foreskin back, check with your caregiver.  · Wash the end of the penis and foreskin thoroughly using warm water only. Topical antibiotics, antifungals, or cortisone medications may be used.  · After washing, dry the end of the penis and foreskin thoroughly. More thorough drying can be done using a fan or hair dryer.  · After drying, replace the foreskin.  · When you urinate, slide the foreskin back. This will help keep urine from wetting the foreskin. Following urination, dry the end of the penis and replace the foreskin.  · Good hygiene usually leads to rapid improvement in problems. Good hygiene will also help prevent further problems.  SEEK MEDICAL CARE IF:   · You experience repeated problems despite good hygiene.  · You develop a fever or are unable to urinate.  MAKE SURE YOU:    · Understand these instructions.  · Will watch your condition.  · Will get help right away if you are not doing well or get worse.  Document Released: 08/10/2002 Document Revised: 08/12/2011 Document Reviewed: 09/12/2008  ExitCare® Patient Information ©2014 [employer].

## 2012-12-14 NOTE — Progress Notes (Signed)
Subjective:    Patient ID: [name], male    DOB: [birth year], 62 y.o.   MRN: [id number]  HPI  62 year old patient who is noticed some soreness redness and some discharge about the glans penis. Diabetic regimen includes [place]. Since starting this new medication he has had blood sugars occasionally as low as 50.  Past Medical History  Diagnosis Date  . 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 Hyperlipidemia   . CVA (cerebral vascular accident) 2005    hx of right brain subcortical stroke  . 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 Pneumonia Feb 2008  . Diabetes mellitus type II   . Arthritis     joints,   . H/O hiatal hernia   . Radiculopathy     S1  . Hypertension     Dr. Maylon Cos cardiology  . Sinus congestion 06/19/11    pt has had sinus "infection" and just finished a z-pak  dr Clarisa Kindred aware    History   Social History  . Marital Status: Married    Spouse Name: N/A    Number of Children: N/A  . Years of Education: N/A   Occupational History  . Not on file.   Social History Main Topics  . Smoking status: Former Smoker -- 30 years    Quit date: 06/03/2004  . Smokeless tobacco: Never Used  . Alcohol Use: No     Comment: rarely  . Drug Use: No  . Sexually Active: Not on file   Other Topics Concern  . Not on file   Social History Narrative  . No narrative on file    Past Surgical History  Procedure Laterality Date  . Lymph node biospy    . Colonoscopy  2003  . 2-d echocardiogram  April 2002    ejection fraction 55 to 65%  . Cataract extraction w/phaco  06/19/2011    Procedure: CATARACT EXTRACTION PHACO AND INTRAOCULAR LENS PLACEMENT (IOC);  Surgeon: Shade Flood, MD;  Location: [facility] OR;  Service: Ophthalmology;  Laterality: Right;  Alcon [place]rysof FMA50BM +18.00 D Right    Family History  Problem Relation Age of Onset  . Diabetes Mother   . Heart disease Mother   . Heart  failure Father   . Anesthesia problems Neg Hx     Allergies  Allergen Reactions  . Codeine Phosphate     REACTION: unspecified    Current Outpatient Prescriptions on File Prior to Visit  Medication Sig Dispense Refill  . ACCU-CHEK AVIVA PLUS test strip USE DAILY AS DIRECTED.  100 each  3  . albuterol (VENTOLIN HFA) 108 (90 BASE) MCG/ACT inhaler Inhale 2 puffs into the lungs every 4 (four) hours as needed for wheezing. Needs office visit for further refills  1 Inhaler  0  . amLODipine (NORVASC) 10 MG tablet TAKE 1 TABLET EVERY DAY  90 tablet  4  . aspirin 325 MG tablet Take 325 mg by mouth daily.        Marland Kitchen atorvastatin (LIPITOR) 10 MG tablet TAKE 1 TABLET EVERY DAY  90 tablet  3  . B-D UF III MINI PEN NEEDLES 31G X 5 MM MISC USE TWICE A DAY  100 each  5  . BYDUREON 2 MG SUSR INJECT INTO THE SKIN ONCE A WEEK.  12 mg  3  . fexofenadine-pseudoephedrine (ALLEGRA-D) 60-120 MG per tablet Take 1 tablet by mouth daily as needed.  For seasonal allergies      . fluticasone (FLONASE) 50 MCG/ACT nasal spray 2 SPRAYS IN EACH NOSTRIL EVERY DAY  16 g  4  . fosinopril (MONOPRIL) 20 MG tablet TAKE 1 TABLET TWICE A DAY  180 tablet  3  . glimepiride (AMARYL) 4 MG tablet TAKE 1 TABLET TWICE A DAY  180 tablet  1  . glucose blood (ACCU-CHEK COMFORT CURVE) test strip 1 each by Other route daily. Use as instructed       . KLOR-CON M20 20 MEQ tablet TAKE 1 TABLET BY MOUTH ONCE DAILY  90 tablet  3  . Lancets (ACCU-CHEK MULTICLIX) lancets 1 each by Other route daily.  102 each  2  . LORazepam (ATIVAN) 0.5 MG tablet Take 2 tablets (1 mg total) by mouth 2 (two) times daily as needed. For anxiety  30 tablet  1  . metFORMIN (GLUCOPHAGE) 1000 MG tablet TAKE 1 TABLET TWICE A DAY  180 tablet  2  . metoprolol (LOPRESSOR) 50 MG tablet TAKE 1 TABLET EVERY DAY  90 tablet  3  . Multiple Vitamins-Minerals (CENTRUM SILVER PO) Take 1 tablet by mouth daily.        . OMEGA 3 1200 MG CAPS Take 1,200 mg by mouth daily.      Bertram Gala Glycol-Propyl Glycol (SYSTANE ULTRA OP) Place 1-2 drops into both eyes every 2 (two) hours.      Marland Kitchen terazosin (HYTRIN) 5 MG capsule TAKE ONE CAPSULE EVERY DAY  90 capsule  3   No current facility-administered medications on file prior to visit.    BP 140/80  Pulse 100  Temp(Src) 98.3 F (36.8 C) (Oral)  Resp 20  Wt 162 lb (73.483 kg)  BMI 26.16 kg/m2  SpO2 94%       Review of Systems  Skin: Positive for rash.       Objective:   Physical Exam  Skin:  Mild balanitis          Assessment & Plan:   Balanitis.Possibly  related to [place]. We'll decrease dose to 5 mg daily and treat with Lotrimin cream Diabetes mellitus. Patient has had some low blood sugar readings. We'll decrease Amaryl to 2 mg daily

## 2013-01-04 ENCOUNTER — Other Ambulatory Visit: Payer: Self-pay | Admitting: Internal Medicine

## 2013-01-10 ENCOUNTER — Other Ambulatory Visit: Payer: Self-pay | Admitting: Internal Medicine

## 2013-01-14 ENCOUNTER — Telehealth: Payer: Self-pay | Admitting: Internal Medicine

## 2013-01-14 MED ORDER — GLUCOSE BLOOD VI STRP: 1.0000 | ORAL_STRIP | Freq: Every day | Status: DC

## 2013-01-14 NOTE — Telephone Encounter (Signed)
Spoke with patient and he is using Accu-chek Plus and tests once daily.  Rx sent to pharmacy.

## 2013-01-14 NOTE — Telephone Encounter (Signed)
[name], can you help with this?  I put in the lancets but cannot find the test strips.

## 2013-01-14 NOTE — Addendum Note (Signed)
Addended by: Kern Reap B on: 01/14/2013 11:58 AM   Modules accepted: Orders

## 2013-01-14 NOTE — Telephone Encounter (Signed)
Caller: Ronnald/Patient; Phone: [phone]; Reason for Call: Patient calling stating he received new glucose meter and now uses Accucheck fast click lancet and will need test strips.  He called to update chart so when he needs a refill.

## 2013-02-12 ENCOUNTER — Encounter: Payer: Self-pay | Admitting: Internal Medicine

## 2013-02-12 ENCOUNTER — Ambulatory Visit (INDEPENDENT_AMBULATORY_CARE_PROVIDER_SITE_OTHER): Payer: 59 | Admitting: Internal Medicine

## 2013-02-12 VITALS — BP 140/72 | HR 94 | Temp 98.1°F | Resp 20 | Wt 157.0 lb

## 2013-02-12 DIAGNOSIS — Z23 Encounter for immunization: Secondary | ICD-10-CM

## 2013-02-12 DIAGNOSIS — E119 Type 2 diabetes mellitus without complications: Secondary | ICD-10-CM

## 2013-02-12 DIAGNOSIS — E785 Hyperlipidemia, unspecified: Secondary | ICD-10-CM

## 2013-02-12 DIAGNOSIS — I10 Essential (primary) hypertension: Secondary | ICD-10-CM

## 2013-02-12 MED ORDER — DAPAGLIFLOZIN PROPANEDIOL 10 MG PO TABS: 1.0000 | ORAL_TABLET | Freq: Every day | ORAL | Status: DC

## 2013-02-12 MED ORDER — ALBUTEROL SULFATE HFA 108 (90 BASE) MCG/ACT IN AERS: 2.0000 | INHALATION_SPRAY | RESPIRATORY_TRACT | Status: DC | PRN

## 2013-02-12 MED ORDER — ACCU-CHEK FASTCLIX LANCETS MISC: 1.0000 | Freq: Every day | Status: DC | PRN

## 2013-02-12 NOTE — Progress Notes (Signed)
Subjective:    Patient ID: [name], male    DOB: 01-04-1951, 62 y.o.   MRN: [id number]  HPI  62 year old patient who is seen today for followup of type 2 diabetes. There is been some modest weight loss and hopefully improved glycemic control after addition of farxiga. He remains on a 5 mg dose and apparently did not tolerate 10 mg well. Remains on oral medications as well as GLP 1 agonist. No hypoglycemia He has a history of congestive heart failure which has been stable. He is treated hypertension. Blood pressure is well-controlled today  Wt Readings from Last 3 Encounters:  02/12/13 157 lb (71.215 kg)  12/14/12 162 lb (73.483 kg)  11/12/12 164 lb (74.39 kg)   Past Medical History  Diagnosis Date  . 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 Hyperlipidemia   . CVA (cerebral vascular accident) 2005    hx of right brain subcortical stroke  . 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 Pneumonia Feb 2008  . Diabetes mellitus type II   . Arthritis     joints,   . H/O hiatal hernia   . Radiculopathy     S1  . Hypertension     Dr. Maylon Cos cardiology  . Sinus congestion 06/19/11    pt has had sinus "infection" and just finished a z-pak  dr Clarisa Kindred aware    History   Social History  . Marital Status: Married    Spouse Name: N/A    Number of Children: N/A  . Years of Education: N/A   Occupational History  . Not on file.   Social History Main Topics  . Smoking status: Former Smoker -- 30 years    Quit date: 06/03/2004  . Smokeless tobacco: Never Used  . Alcohol Use: No     Comment: rarely  . Drug Use: No  . Sexual Activity: Not on file   Other Topics Concern  . Not on file   Social History Narrative  . No narrative on file    Past Surgical History  Procedure Laterality Date  . Lymph node biospy    . Colonoscopy  2003  . 2-d echocardiogram  April 2002    ejection fraction 55 to 65%  .  Cataract extraction w/phaco  06/19/2011    Procedure: CATARACT EXTRACTION PHACO AND INTRAOCULAR LENS PLACEMENT (IOC);  Surgeon: Shade Flood, MD;  Location: [facility] OR;  Service: Ophthalmology;  Laterality: Right;  [place] FMA50BM +18.00 D Right    Family History  Problem Relation Age of Onset  . Diabetes Mother   . Heart disease Mother   . Heart failure Father   . Anesthesia problems Neg Hx     Allergies  Allergen Reactions  . Codeine Phosphate     REACTION: unspecified    Current Outpatient Prescriptions on File Prior to Visit  Medication Sig Dispense Refill  . amLODipine (NORVASC) 10 MG tablet TAKE 1 TABLET EVERY DAY  90 tablet  1  . aspirin 325 MG tablet Take 325 mg by mouth daily.        Marland Kitchen atorvastatin (LIPITOR) 10 MG tablet TAKE 1 TABLET EVERY DAY  90 tablet  3  . B-D UF III MINI PEN NEEDLES 31G X 5 MM MISC USE TWICE A DAY  100 each  5  . BYDUREON 2 MG SUSR INJECT INTO THE SKIN ONCE A WEEK.  12 mg  3  . clotrimazole (LOTRIMIN) 1 % cream Apply topically 2 (two) times daily.  30 g  0  . fexofenadine-pseudoephedrine (ALLEGRA-D) 60-120 MG per tablet Take 1 tablet by mouth daily as needed. For seasonal allergies      . fluticasone (FLONASE) 50 MCG/ACT nasal spray 2 SPRAYS IN EACH NOSTRIL EVERY DAY  16 g  4  . fosinopril (MONOPRIL) 20 MG tablet TAKE 1 TABLET TWICE A DAY  180 tablet  1  . glucose blood (ACCU-CHEK AVIVA PLUS) test strip 1 each by Other route daily. Use as instructed  100 each  12  . KLOR-CON M20 20 MEQ tablet TAKE 1 TABLET BY MOUTH ONCE DAILY  90 tablet  3  . LORazepam (ATIVAN) 0.5 MG tablet Take 2 tablets (1 mg total) by mouth 2 (two) times daily as needed. For anxiety  30 tablet  1  . metFORMIN (GLUCOPHAGE) 1000 MG tablet TAKE 1 TABLET TWICE A DAY  180 tablet  2  . metoprolol (LOPRESSOR) 50 MG tablet TAKE 1 TABLET EVERY DAY  90 tablet  3  . Multiple Vitamins-Minerals (CENTRUM SILVER PO) Take 1 tablet by mouth daily.        . OMEGA 3 1200 MG CAPS Take 1,200 mg by  mouth daily.      Bertram Gala Glycol-Propyl Glycol (SYSTANE ULTRA OP) Place 1-2 drops into both eyes every 2 (two) hours.      Marland Kitchen terazosin (HYTRIN) 5 MG capsule TAKE ONE CAPSULE EVERY DAY  90 capsule  3   No current facility-administered medications on file prior to visit.    BP 140/72  Pulse 94  Temp(Src) 98.1 F (36.7 C) (Oral)  Resp 20  Wt 157 lb (71.215 kg)  BMI 25.35 kg/m2  SpO2 95%      Review of Systems  Constitutional: Negative for fever, chills, appetite change and fatigue.  HENT: Negative for hearing loss, ear pain, congestion, sore throat, trouble swallowing, neck stiffness, dental problem, voice change and tinnitus.   Eyes: Negative for pain, discharge and visual disturbance.  Respiratory: Negative for cough, chest tightness, wheezing and stridor.   Cardiovascular: Negative for chest pain, palpitations and leg swelling.  Gastrointestinal: Negative for nausea, vomiting, abdominal pain, diarrhea, constipation, blood in stool and abdominal distention.  Genitourinary: Negative for urgency, hematuria, flank pain, discharge, difficulty urinating and genital sores.  Musculoskeletal: Negative for myalgias, back pain, joint swelling, arthralgias and gait problem.  Skin: Negative for rash.  Neurological: Negative for dizziness, syncope, speech difficulty, weakness, numbness and headaches.  Hematological: Negative for adenopathy. Does not bruise/bleed easily.  Psychiatric/Behavioral: Negative for behavioral problems and dysphoric mood. The patient is not nervous/anxious.        Objective:   Physical Exam  Constitutional: He is oriented to person, place, and time. He appears well-developed.  HENT:  Head: Normocephalic.  Right Ear: External ear normal.  Left Ear: External ear normal.  Eyes: Conjunctivae and EOM are normal.  Neck: Normal range of motion.  Cardiovascular: Normal rate and normal heart sounds.   Pulmonary/Chest: Breath sounds normal.  Abdominal: Bowel sounds  are normal.  Musculoskeletal: Normal range of motion. He exhibits no edema and no tenderness.  Neurological: He is alert and oriented to person, place, and time.  Psychiatric: He has a normal mood and affect. His behavior is normal.          Assessment & Plan:   Diabetes mellitus. Will increase farxiga to 10 mg daily if tolerated will check a hemoglobin A1c  Hypertension stable History congestive heart failure stable  Recheck 3 months

## 2013-02-12 NOTE — Patient Instructions (Addendum)
Please check your hemoglobin A1c every 3 months  Limit your sodium (Salt) intake   

## 2013-03-23 ENCOUNTER — Ambulatory Visit (INDEPENDENT_AMBULATORY_CARE_PROVIDER_SITE_OTHER): Payer: 59 | Admitting: Family Medicine

## 2013-03-23 VITALS — BP 126/68 | HR 90 | Temp 98.0°F | Resp 16 | Ht 71.0 in | Wt 155.4 lb

## 2013-03-23 DIAGNOSIS — J019 Acute sinusitis, unspecified: Secondary | ICD-10-CM

## 2013-03-23 DIAGNOSIS — R059 Cough, unspecified: Secondary | ICD-10-CM

## 2013-03-23 DIAGNOSIS — R05 Cough: Secondary | ICD-10-CM

## 2013-03-23 MED ORDER — AMOXICILLIN 500 MG PO CAPS: 1000.0000 mg | ORAL_CAPSULE | Freq: Two times a day (BID) | ORAL | Status: DC

## 2013-03-23 MED ORDER — HYDROCOD POLST-CHLORPHEN POLST 10-8 MG/5ML PO LQCR: 5.0000 mL | Freq: Two times a day (BID) | ORAL | Status: DC | PRN

## 2013-03-23 NOTE — Patient Instructions (Signed)
Use the amoxicillin as directed and the tussionex syrup as needed.  Remember it can cause cause drowsiness so do not use it when you need to drive.

## 2013-03-23 NOTE — Progress Notes (Signed)
[facility] [address], [place] [address] [phone]  Date:  03/23/2013   Name:  [name]   DOB:  February 07, 1951   MRN:  [id number]  PCP:  Rogelia Boga, MD    Chief Complaint: Cough, Chills, Nasal Congestion and Runny Nose   History of Present Illness:  [name] is a 62 y.o. very pleasant male patient who presents with the following:  He has noted "constantsinus problems" for 6- 8 weeks. It will get better temporarily but then come back.  He sometimes will have a cough, feels cold and feels tired.  He will get headaches sometimes, but he mostly has nasal drainage which he will cough up.   Sometimes his cough will be productive.  He notes sinus pain and pressure He has not had a fever.   No sore throat, no earache.   This is the first time he has sought treatment for this.   He has tried some OTC medications such as allegra, zyrtec, dayquil, nyquil.   He had Hodgkin's disease about 14 years ago- no recurrence.  His heart is doing much better since he had the chemo associated CHF.    Notes that he has taken tussionex recently without any difficulty. He does not think he is truly allergic to codiene   Patient Active Problem List   Diagnosis Date Noted  . UPPER RESPIRATORY INFECTION, VIRAL 02/24/2007  . HODGKIN'S LYMPHOMA 11/06/2006  . DIABETES MELLITUS, TYPE II 11/06/2006  . HYPERLIPIDEMIA 11/06/2006  . HYPERTENSION 11/06/2006  . CONGESTIVE HEART FAILURE 11/06/2006  . CEREBROVASCULAR ACCIDENT, HX OF 11/06/2006    Past Medical History  Diagnosis Date  . 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 Hyperlipidemia   . CVA (cerebral vascular accident) 2005    hx of right brain subcortical stroke  . 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 Pneumonia Feb 2008  . Diabetes mellitus type II   . Arthritis     joints,   . H/O hiatal hernia   . Radiculopathy      S1  . Hypertension     Dr. Maylon Cos cardiology  . Sinus congestion 06/19/11    pt has had sinus "infection" and just finished a z-pak  dr Clarisa Kindred aware    Past Surgical History  Procedure Laterality Date  . Lymph node biospy    . Colonoscopy  2003  . 2-d echocardiogram  April 2002    ejection fraction 55 to 65%  . Cataract extraction w/phaco  06/19/2011    Procedure: CATARACT EXTRACTION PHACO AND INTRAOCULAR LENS PLACEMENT (IOC);  Surgeon: Shade Flood, MD;  Location: [facility] OR;  Service: Ophthalmology;  Laterality: Right;  [place]con [place] FMA50BM +18.00 D Right    History  Substance Use Topics  . Smoking status: Former Smoker -- 30 years    Quit date: 06/03/2004  . Smokeless tobacco: Never Used  . Alcohol Use: No     Comment: rarely    Family History  Problem Relation Age of Onset  . Diabetes Mother   . Heart disease Mother   . Heart failure Father   . Anesthesia problems Neg Hx   . Cancer Sister   . Cancer Brother     Allergies  Allergen Reactions  . Codeine Phosphate     REACTION: unspecified    Medication list has been reviewed and updated.  Current Outpatient Prescriptions  on File Prior to Visit  Medication Sig Dispense Refill  . ACCU-CHEK FASTCLIX LANCETS MISC 1 each by Does not apply route daily as needed.  102 each  5  . albuterol (VENTOLIN HFA) 108 (90 BASE) MCG/ACT inhaler Inhale 2 puffs into the lungs every 4 (four) hours as needed for wheezing. Needs office visit for further refills  1 Inhaler  5  . amLODipine (NORVASC) 10 MG tablet TAKE 1 TABLET EVERY DAY  90 tablet  1  . aspirin 325 MG tablet Take 325 mg by mouth daily.        Marland Kitchen atorvastatin (LIPITOR) 10 MG tablet TAKE 1 TABLET EVERY DAY  90 tablet  3  . B-D UF III MINI PEN NEEDLES 31G X 5 MM MISC USE TWICE A DAY  100 each  5  . BYDUREON 2 MG SUSR INJECT INTO THE SKIN ONCE A WEEK.  12 mg  3  . clotrimazole (LOTRIMIN) 1 % cream Apply topically 2 (two) times daily.  30 g  0  . Dapagliflozin Propanediol  (FARXIGA) 10 MG TABS Take 1 tablet by mouth daily.  30 tablet  6  . fexofenadine-pseudoephedrine (ALLEGRA-D) 60-120 MG per tablet Take 1 tablet by mouth daily as needed. For seasonal allergies      . fluticasone (FLONASE) 50 MCG/ACT nasal spray 2 SPRAYS IN EACH NOSTRIL EVERY DAY  16 g  4  . fosinopril (MONOPRIL) 20 MG tablet TAKE 1 TABLET TWICE A DAY  180 tablet  1  . glimepiride (AMARYL) 2 MG tablet Take 2 mg by mouth daily before breakfast.      . glucose blood (ACCU-CHEK AVIVA PLUS) test strip 1 each by Other route daily. Use as instructed  100 each  12  . KLOR-CON M20 20 MEQ tablet TAKE 1 TABLET BY MOUTH ONCE DAILY  90 tablet  3  . LORazepam (ATIVAN) 0.5 MG tablet Take 2 tablets (1 mg total) by mouth 2 (two) times daily as needed. For anxiety  30 tablet  1  . metFORMIN (GLUCOPHAGE) 1000 MG tablet TAKE 1 TABLET TWICE A DAY  180 tablet  2  . metoprolol (LOPRESSOR) 50 MG tablet TAKE 1 TABLET EVERY DAY  90 tablet  3  . Multiple Vitamins-Minerals (CENTRUM SILVER PO) Take 1 tablet by mouth daily.        . OMEGA 3 1200 MG CAPS Take 1,200 mg by mouth daily.      Bertram Gala Glycol-Propyl Glycol (SYSTANE ULTRA OP) Place 1-2 drops into both eyes every 2 (two) hours.      Marland Kitchen terazosin (HYTRIN) 5 MG capsule TAKE ONE CAPSULE EVERY DAY  90 capsule  3   No current facility-administered medications on file prior to visit.    Review of Systems:  As per HPI- otherwise negative.   Physical Examination: Filed Vitals:   03/23/13 1448  BP: 126/68  Pulse: 90  Temp: 98 F (36.7 C)  Resp: 16   Filed Vitals:   03/23/13 1448  Height: 5\' 11"  (1.803 m)  Weight: 155 lb 6.4 oz (70.489 kg)   Body mass index is 21.68 kg/(m^2). Ideal Body Weight: Weight in (lb) to have BMI = 25: 178.9  GEN: WDWN, NAD, Non-toxic, A & O x 3.  Looks well HEENT: Atraumatic, Normocephalic. Neck supple. No masses, No LAD.  Purulent nasal discharge on the right. Bilateral TM wnl, oropharynx normal.  PEERL,EOMI.   Ears and Nose:  No external deformity. CV: RRR, No M/G/R. No JVD. No thrill. No  extra heart sounds. PULM: CTA B, no wheezes, crackles, rhonchi. No retractions. No resp. distress. No accessory muscle use. EXTR: No c/c/e NEURO Normal gait.  PSYCH: Normally interactive. Conversant. Not depressed or anxious appearing.  Calm demeanor.    Assessment and Plan: Sinusitis, acute - Plan: amoxicillin (AMOXIL) 500 MG capsule  Cough - Plan: chlorpheniramine-HYDROcodone (TUSSIONEX PENNKINETIC ER) 10-8 MG/5ML LQCR  Treat for sinusitis with amoxicillin and tussionex as needed for cough.   He will let me know if not better soon- Sooner if worse.     Signed Abbe Amsterdam, MD

## 2013-05-11 ENCOUNTER — Ambulatory Visit (INDEPENDENT_AMBULATORY_CARE_PROVIDER_SITE_OTHER): Payer: 59 | Admitting: Internal Medicine

## 2013-05-11 ENCOUNTER — Encounter: Payer: Self-pay | Admitting: Internal Medicine

## 2013-05-11 VITALS — BP 136/78 | HR 88 | Temp 97.8°F | Resp 20 | Wt 158.0 lb

## 2013-05-11 DIAGNOSIS — E119 Type 2 diabetes mellitus without complications: Secondary | ICD-10-CM

## 2013-05-11 DIAGNOSIS — I10 Essential (primary) hypertension: Secondary | ICD-10-CM

## 2013-05-11 DIAGNOSIS — E785 Hyperlipidemia, unspecified: Secondary | ICD-10-CM

## 2013-05-11 LAB — HEMOGLOBIN A1C: Hgb A1c MFr Bld: 6.3 % (ref 4.6–6.5)

## 2013-05-11 MED ORDER — AZITHROMYCIN 250 MG PO TABS: ORAL_TABLET | ORAL | Status: DC

## 2013-05-11 NOTE — Patient Instructions (Signed)
Take over-the-counter expectorants and cough medications such as  Mucinex DM.  Call if there is no improvement in 5 to 7 days or if he developed worsening cough, fever, or new symptoms, such as shortness of breath or chest pain.  Take your antibiotic as prescribed until ALL of it is gone, but stop if you develop a rash, swelling, or any side effects of the medication.  Contact our office as soon as possible if  there are side effects of the medication.  Return in 3 months for follow-up

## 2013-05-11 NOTE — Progress Notes (Signed)
Subjective:    Patient ID: [name], male    DOB: 11/20/1950, 62 y.o.   MRN: [id number]  HPI Pre-visit discussion using our clinic review tool. No additional management support is needed unless otherwise documented below in the visit note.  62 year old patient who is seen today for his correlate followup. He has type 2 diabetes. Blood sugars seem reasonably well controlled. His last hemoglobin A1c is 7.0 He continues to have cough and chest congestion with mucus production. He was seen earlier and treated with amoxicillin for 10 days seemed to improve but symptoms now have relapsed. History of hypertension and dyslipidemia. He does have a history of community-acquired pneumonia as well as congestive heart failure. No recent fever. He has had an eye examination 3 months ago and is scheduled for followup later this week.  Past Medical History  Diagnosis Date  . 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 Hyperlipidemia   . CVA (cerebral vascular accident) 2005    hx of right brain subcortical stroke  . 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 Pneumonia Feb 2008  . Diabetes mellitus type II   . Arthritis     joints,   . H/O hiatal hernia   . Radiculopathy     S1  . Hypertension     Dr. Maylon Cos cardiology  . Sinus congestion 06/19/11    pt has had sinus "infection" and just finished a z-pak  dr Clarisa Kindred aware    History   Social History  . Marital Status: Married    Spouse Name: N/A    Number of Children: N/A  . Years of Education: N/A   Occupational History  . Not on file.   Social History Main Topics  . Smoking status: Former Smoker -- 30 years    Quit date: 06/03/2004  . Smokeless tobacco: Never Used  . Alcohol Use: No     Comment: rarely  . Drug Use: No  . Sexual Activity: Not on file   Other Topics Concern  . Not on file   Social History Narrative  . No narrative on file    Past  Surgical History  Procedure Laterality Date  . Lymph node biospy    . Colonoscopy  2003  . 2-d echocardiogram  April 2002    ejection fraction 55 to 65%  . Cataract extraction w/phaco  06/19/2011    Procedure: CATARACT EXTRACTION PHACO AND INTRAOCULAR LENS PLACEMENT (IOC);  Surgeon: Shade Flood, MD;  Location: [facility] OR;  Service: Ophthalmology;  Laterality: Right;  [place] FMA50BM +18.00 D Right    Family History  Problem Relation Age of Onset  . Diabetes Mother   . Heart disease Mother   . Heart failure Father   . Anesthesia problems Neg Hx   . Cancer Sister   . Cancer Brother     Allergies  Allergen Reactions  . Codeine Phosphate     REACTION: unspecified    Current Outpatient Prescriptions on File Prior to Visit  Medication Sig Dispense Refill  . ACCU-CHEK FASTCLIX LANCETS MISC 1 each by Does not apply route daily as needed.  102 each  5  . albuterol (VENTOLIN HFA) 108 (90 BASE) MCG/ACT inhaler Inhale 2 puffs into the lungs every 4 (four) hours as needed for wheezing. Needs office visit for further refills  1 Inhaler  5  . amLODipine (NORVASC) 10 MG tablet TAKE 1  TABLET EVERY DAY  90 tablet  1  . aspirin 325 MG tablet Take 325 mg by mouth daily.        Marland Kitchen atorvastatin (LIPITOR) 10 MG tablet TAKE 1 TABLET EVERY DAY  90 tablet  3  . B-D UF III MINI PEN NEEDLES 31G X 5 MM MISC USE TWICE A DAY  100 each  5  . BYDUREON 2 MG SUSR INJECT INTO THE SKIN ONCE A WEEK.  12 mg  3  . clotrimazole (LOTRIMIN) 1 % cream Apply topically 2 (two) times daily.  30 g  0  . Dapagliflozin Propanediol (FARXIGA) 10 MG TABS Take 1 tablet by mouth daily.  30 tablet  6  . fexofenadine-pseudoephedrine (ALLEGRA-D) 60-120 MG per tablet Take 1 tablet by mouth daily as needed. For seasonal allergies      . fosinopril (MONOPRIL) 20 MG tablet TAKE 1 TABLET TWICE A DAY  180 tablet  1  . glimepiride (AMARYL) 2 MG tablet Take 2 mg by mouth daily before breakfast.      . glucose blood (ACCU-CHEK AVIVA PLUS)  test strip 1 each by Other route daily. Use as instructed  100 each  12  . KLOR-CON M20 20 MEQ tablet TAKE 1 TABLET BY MOUTH ONCE DAILY  90 tablet  3  . LORazepam (ATIVAN) 0.5 MG tablet Take 2 tablets (1 mg total) by mouth 2 (two) times daily as needed. For anxiety  30 tablet  1  . metFORMIN (GLUCOPHAGE) 1000 MG tablet TAKE 1 TABLET TWICE A DAY  180 tablet  2  . metoprolol (LOPRESSOR) 50 MG tablet TAKE 1 TABLET EVERY DAY  90 tablet  3  . Multiple Vitamins-Minerals (CENTRUM SILVER PO) Take 1 tablet by mouth daily.        . OMEGA 3 1200 MG CAPS Take 1,200 mg by mouth daily.      Bertram Gala Glycol-Propyl Glycol (SYSTANE ULTRA OP) Place 1-2 drops into both eyes every 2 (two) hours.      Marland Kitchen terazosin (HYTRIN) 5 MG capsule TAKE ONE CAPSULE EVERY DAY  90 capsule  3   No current facility-administered medications on file prior to visit.    BP 136/78  Pulse 88  Temp(Src) 97.8 F (36.6 C) (Oral)  Resp 20  Wt 158 lb (71.668 kg)  SpO2 96%       Review of Systems  Constitutional: Negative for fever, chills, appetite change and fatigue.  HENT: Positive for postnasal drip and sinus pressure. Negative for congestion, dental problem, ear pain, hearing loss, sore throat, tinnitus, trouble swallowing and voice change.   Eyes: Negative for pain, discharge and visual disturbance.  Respiratory: Positive for cough. Negative for chest tightness, wheezing and stridor.   Cardiovascular: Negative for chest pain, palpitations and leg swelling.  Gastrointestinal: Negative for nausea, vomiting, abdominal pain, diarrhea, constipation, blood in stool and abdominal distention.  Genitourinary: Negative for urgency, hematuria, flank pain, discharge, difficulty urinating and genital sores.  Musculoskeletal: Negative for arthralgias, back pain, gait problem, joint swelling, myalgias and neck stiffness.  Skin: Negative for rash.  Neurological: Negative for dizziness, syncope, speech difficulty, weakness, numbness and  headaches.  Hematological: Negative for adenopathy. Does not bruise/bleed easily.  Psychiatric/Behavioral: Negative for behavioral problems and dysphoric mood. The patient is not nervous/anxious.        Objective:   Physical Exam  Constitutional: He is oriented to person, place, and time. He appears well-developed.  HENT:  Head: Normocephalic.  Right Ear: External ear normal.  Left Ear: External ear normal.  Eyes: Conjunctivae and EOM are normal.  Neck: Normal range of motion.  Cardiovascular: Normal rate and normal heart sounds.   Pulmonary/Chest: Effort normal. He has rales.  A few scattered bibasilar rales  Abdominal: Bowel sounds are normal.  Musculoskeletal: Normal range of motion. He exhibits no edema and no tenderness.  Neurological: He is alert and oriented to person, place, and time.  Psychiatric: He has a normal mood and affect. His behavior is normal.          Assessment & Plan:   Diabetes mellitus. We'll check a hemoglobin A1c. Weight loss encouraged Bronchitis. Will retreat with azithromycin Hypertension well controlled. Blood pressure 130/70 Dyslipidemia. Continue statin therapy  Recheck 3 months

## 2013-05-11 NOTE — Progress Notes (Signed)
Pre-visit discussion using our clinic review tool. No additional management support is needed unless otherwise documented below in the visit note.  

## 2013-05-14 ENCOUNTER — Ambulatory Visit: Payer: 59 | Admitting: Internal Medicine

## 2013-05-22 ENCOUNTER — Other Ambulatory Visit: Payer: Self-pay | Admitting: Internal Medicine

## 2013-05-25 ENCOUNTER — Other Ambulatory Visit: Payer: Self-pay | Admitting: Internal Medicine

## 2013-06-05 ENCOUNTER — Other Ambulatory Visit: Payer: Self-pay | Admitting: Internal Medicine

## 2013-06-11 ENCOUNTER — Ambulatory Visit (HOSPITAL_BASED_OUTPATIENT_CLINIC_OR_DEPARTMENT_OTHER): Payer: Medicare Other | Admitting: Oncology

## 2013-06-11 ENCOUNTER — Telehealth: Payer: Self-pay | Admitting: Oncology

## 2013-06-11 ENCOUNTER — Encounter: Payer: Self-pay | Admitting: Oncology

## 2013-06-11 VITALS — BP 136/77 | HR 100 | Resp 18 | Wt 160.5 lb

## 2013-06-11 DIAGNOSIS — C8190 Hodgkin lymphoma, unspecified, unspecified site: Secondary | ICD-10-CM | POA: Diagnosis not present

## 2013-06-11 NOTE — Telephone Encounter (Signed)
gv and printed appt sched na davs for pt for [name] 2016

## 2013-06-11 NOTE — Progress Notes (Signed)
   [name]    OFFICE PROGRESS NOTE   INTERVAL HISTORY:   [name] returns for scheduled followup of Hodgkin's disease. He feels well. Good appetite. He has occasional sweats at night. He relates this to diabetes. No total body sweats. Intermittent nodular skin lesions that spontaneously resolved. No dyspnea.  Objective:  Vital signs in last 24 hours:  Blood pressure 136/77, pulse 100, resp. rate 18, weight 160 lb 8 oz (72.802 kg).    HEENT: Neck without mass Lymphatics: No cervical, supraclavicular, axillary, or inguinal nodes Resp: Distant breath sounds bilaterally, no respiratory distress Cardio: Regular rate and rhythm GI: No hepatosplenomegaly Vascular: No leg edema  Skin: No nodular skin lesions over the arms or,     Medications: I have reviewed the patient's current medications.  Assessment/Plan: 1. Hodgkins lymphoma , stage IV, diagnosed in May 2000 - he remains in clinical remission. 2. History of a nodular skin rash with a biopsy August 02, 2006, confirming a CD30 positive lymphoproliferative disorder. He reports intermittent nodular skin lesions over the past few years that spontaneously resolve and occur months apart. No apparent lesions today.   Disposition:  [name] remains in clinical remission from Hodgkin's lymphoma. He would like to continue followup at the [facility]. He will return for an office visit in one year. He will contact us for a skin lesion that does not spontaneously resolve or new symptoms.   Betsy Coder, MD  06/11/2013  11:31 AM

## 2013-06-14 ENCOUNTER — Other Ambulatory Visit: Payer: Self-pay | Admitting: Internal Medicine

## 2013-07-05 ENCOUNTER — Other Ambulatory Visit: Payer: Self-pay | Admitting: Internal Medicine

## 2013-07-11 ENCOUNTER — Other Ambulatory Visit: Payer: Self-pay | Admitting: Internal Medicine

## 2013-08-10 ENCOUNTER — Other Ambulatory Visit (INDEPENDENT_AMBULATORY_CARE_PROVIDER_SITE_OTHER): Payer: Medicare Other

## 2013-08-10 DIAGNOSIS — Z125 Encounter for screening for malignant neoplasm of prostate: Secondary | ICD-10-CM | POA: Diagnosis not present

## 2013-08-10 DIAGNOSIS — E785 Hyperlipidemia, unspecified: Secondary | ICD-10-CM | POA: Diagnosis not present

## 2013-08-10 DIAGNOSIS — R351 Nocturia: Secondary | ICD-10-CM | POA: Diagnosis not present

## 2013-08-10 DIAGNOSIS — E1310 Other specified diabetes mellitus with ketoacidosis without coma: Secondary | ICD-10-CM

## 2013-08-10 DIAGNOSIS — E1110 Type 2 diabetes mellitus with ketoacidosis without coma: Secondary | ICD-10-CM

## 2013-08-10 DIAGNOSIS — I10 Essential (primary) hypertension: Secondary | ICD-10-CM | POA: Diagnosis not present

## 2013-08-10 DIAGNOSIS — Z0000 Encounter for general adult medical examination without abnormal findings: Secondary | ICD-10-CM

## 2013-08-10 DIAGNOSIS — H2510 Age-related nuclear cataract, unspecified eye: Secondary | ICD-10-CM | POA: Diagnosis not present

## 2013-08-10 LAB — CBC WITH DIFFERENTIAL/PLATELET
BASOS ABS: 0 10*3/uL (ref 0.0–0.1)
Basophils Relative: 0.7 % (ref 0.0–3.0)
EOS ABS: 0.2 10*3/uL (ref 0.0–0.7)
Eosinophils Relative: 3.4 % (ref 0.0–5.0)
HCT: 45.2 % (ref 39.0–52.0)
Hemoglobin: 14.5 g/dL (ref 13.0–17.0)
Lymphocytes Relative: 25.7 % (ref 12.0–46.0)
Lymphs Abs: 1.2 10*3/uL (ref 0.7–4.0)
MCHC: 32.2 g/dL (ref 30.0–36.0)
MCV: 95.3 fl (ref 78.0–100.0)
MONO ABS: 0.7 10*3/uL (ref 0.1–1.0)
Monocytes Relative: 15.3 % — ABNORMAL HIGH (ref 3.0–12.0)
NEUTROS PCT: 54.9 % (ref 43.0–77.0)
Neutro Abs: 2.6 10*3/uL (ref 1.4–7.7)
PLATELETS: 305 10*3/uL (ref 150.0–400.0)
RBC: 4.74 Mil/uL (ref 4.22–5.81)
RDW: 13.1 % (ref 11.5–14.6)
WBC: 4.8 10*3/uL (ref 4.5–10.5)

## 2013-08-10 LAB — MICROALBUMIN / CREATININE URINE RATIO
CREATININE, U: 62.5 mg/dL
MICROALB UR: 55.7 mg/dL — AB (ref 0.0–1.9)
Microalb Creat Ratio: 89.1 mg/g — ABNORMAL HIGH (ref 0.0–30.0)

## 2013-08-10 LAB — HEPATIC FUNCTION PANEL
ALBUMIN: 4.3 g/dL (ref 3.5–5.2)
ALT: 21 U/L (ref 0–53)
AST: 20 U/L (ref 0–37)
Alkaline Phosphatase: 63 U/L (ref 39–117)
Bilirubin, Direct: 0.3 mg/dL (ref 0.0–0.3)
TOTAL PROTEIN: 7.1 g/dL (ref 6.0–8.3)
Total Bilirubin: 2.9 mg/dL — ABNORMAL HIGH (ref 0.3–1.2)

## 2013-08-10 LAB — LIPID PANEL
Cholesterol: 128 mg/dL (ref 0–200)
HDL: 35.5 mg/dL — ABNORMAL LOW (ref >39.00)
LDL CALC: 75 mg/dL (ref 0–99)
Total CHOL/HDL Ratio: 4
Triglycerides: 87 mg/dL (ref 0.0–149.0)
VLDL: 17.4 mg/dL (ref 0.0–40.0)

## 2013-08-10 LAB — HEMOGLOBIN A1C: HEMOGLOBIN A1C: 7.4 % — AB (ref 4.6–6.5)

## 2013-08-10 LAB — BASIC METABOLIC PANEL
BUN: 16 mg/dL (ref 6–23)
CO2: 27 meq/L (ref 19–32)
CREATININE: 0.9 mg/dL (ref 0.4–1.5)
Calcium: 9.4 mg/dL (ref 8.4–10.5)
Chloride: 105 mEq/L (ref 96–112)
GFR: 108.85 mL/min (ref >60.00)
Glucose, Bld: 116 mg/dL — ABNORMAL HIGH (ref 70–99)
Potassium: 4.2 mEq/L (ref 3.5–5.1)
Sodium: 143 mEq/L (ref 135–145)

## 2013-08-10 LAB — POCT URINALYSIS DIPSTICK
Bilirubin, UA: NEGATIVE
Blood, UA: NEGATIVE
LEUKOCYTES UA: NEGATIVE
Nitrite, UA: NEGATIVE
Spec Grav, UA: 1.02
UROBILINOGEN UA: 0.2
pH, UA: 6

## 2013-08-10 LAB — TSH: TSH: 2.19 u[IU]/mL (ref 0.35–5.50)

## 2013-08-10 LAB — PSA: PSA: 1.29 ng/mL (ref 0.10–4.00)

## 2013-08-16 ENCOUNTER — Encounter: Payer: Self-pay | Admitting: *Deleted

## 2013-08-17 ENCOUNTER — Encounter: Payer: Self-pay | Admitting: Gastroenterology

## 2013-08-17 ENCOUNTER — Ambulatory Visit (INDEPENDENT_AMBULATORY_CARE_PROVIDER_SITE_OTHER): Payer: Medicare Other | Admitting: Internal Medicine

## 2013-08-17 ENCOUNTER — Encounter: Payer: Self-pay | Admitting: Internal Medicine

## 2013-08-17 VITALS — BP 120/64 | HR 83 | Temp 98.0°F | Resp 20 | Ht 66.0 in | Wt 161.0 lb

## 2013-08-17 DIAGNOSIS — Z0000 Encounter for general adult medical examination without abnormal findings: Secondary | ICD-10-CM

## 2013-08-17 DIAGNOSIS — I10 Essential (primary) hypertension: Secondary | ICD-10-CM

## 2013-08-17 DIAGNOSIS — E785 Hyperlipidemia, unspecified: Secondary | ICD-10-CM

## 2013-08-17 DIAGNOSIS — Z8679 Personal history of other diseases of the circulatory system: Secondary | ICD-10-CM | POA: Diagnosis not present

## 2013-08-17 DIAGNOSIS — I509 Heart failure, unspecified: Secondary | ICD-10-CM | POA: Diagnosis not present

## 2013-08-17 DIAGNOSIS — E119 Type 2 diabetes mellitus without complications: Secondary | ICD-10-CM | POA: Diagnosis not present

## 2013-08-17 DIAGNOSIS — Z23 Encounter for immunization: Secondary | ICD-10-CM

## 2013-08-17 NOTE — Progress Notes (Signed)
Subjective:    Patient ID: [name], male    DOB: 01/22/1951, 63 y.o.   MRN: [id number]  HPI  63 year old patient who is seen today for a preventive health examination.  He has been seen by oncology due  to a remote history of Hoskins disease.  He has type 2 diabetes.  His last hemoglobin A1c was very well controlled, but he has been fairly inactive through the winter and also has been having some low back pain.  He has dyslipidemia, hypertension, and a history of congestive heart failure.  No cardiopulmonary complaints.  Past Medical History  Diagnosis Date  . 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 Hyperlipidemia   . CVA (cerebral vascular accident) 2005    hx of right brain subcortical stroke  . 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 Pneumonia Feb 2008  . Diabetes mellitus type II   . Arthritis     joints,   . H/O hiatal hernia   . Radiculopathy     S1  . Hypertension     Dr. Vidal Schwalbe cardiology  . Sinus congestion 06/19/11    pt has had sinus "infection" and just finished a z-pak  dr Anderson Malta aware    History   Social History  . Marital Status: Married    Spouse Name: N/A    Number of Children: N/A  . Years of Education: N/A   Occupational History  . Not on file.   Social History Main Topics  . Smoking status: Former Smoker -- 30 years    Quit date: 06/03/2004  . Smokeless tobacco: Never Used  . Alcohol Use: No     Comment: rarely  . Drug Use: No  . Sexual Activity: Not on file   Other Topics Concern  . Not on file   Social History Narrative  . No narrative on file    Past Surgical History  Procedure Laterality Date  . Lymph node biospy    . Colonoscopy  2003  . 2-d echocardiogram  April 2002    ejection fraction 55 to 65%  . Cataract extraction w/phaco  06/19/2011    Procedure: CATARACT EXTRACTION PHACO AND INTRAOCULAR LENS PLACEMENT (IOC);  Surgeon: Adonis Brook, MD;   Location: [facility];  Service: Ophthalmology;  Laterality: Right;  [place] FMA50BM +18.00 D Right    Family History  Problem Relation Age of Onset  . Diabetes Mother   . Heart disease Mother   . Heart failure Father   . Anesthesia problems Neg Hx   . Cancer Sister   . Cancer Brother     Allergies  Allergen Reactions  . Codeine Phosphate     REACTION: unspecified    Current Outpatient Prescriptions on File Prior to Visit  Medication Sig Dispense Refill  . ACCU-CHEK FASTCLIX LANCETS MISC 1 each by Does not apply route daily as needed.  102 each  5  . albuterol (VENTOLIN HFA) 108 (90 BASE) MCG/ACT inhaler Inhale 2 puffs into the lungs every 4 (four) hours as needed for wheezing. Needs office visit for further refills  1 Inhaler  5  . amLODipine (NORVASC) 10 MG tablet TAKE 1 TABLET EVERY DAY  90 tablet  2  . aspirin 325 MG tablet Take 325 mg by mouth daily.        Marland Kitchen atorvastatin (LIPITOR) 10 MG tablet TAKE 1 TABLET EVERY DAY  90 tablet  3  . B-D UF III MINI PEN NEEDLES 31G X 5 MM MISC USE TWICE A DAY  100 each  5  . clotrimazole (LOTRIMIN) 1 % cream Apply topically 2 (two) times daily.  30 g  0  . Dapagliflozin Propanediol (FARXIGA) 10 MG TABS Take 1 tablet by mouth daily.  30 tablet  6  . fexofenadine-pseudoephedrine (ALLEGRA-D) 60-120 MG per tablet Take 1 tablet by mouth daily as needed. For seasonal allergies      . fosinopril (MONOPRIL) 20 MG tablet TAKE 1 TABLET BY MOUTH TWICE DAILY  180 tablet  1  . glimepiride (AMARYL) 2 MG tablet Take 2 mg by mouth daily before breakfast.      . glucose blood (ACCU-CHEK AVIVA PLUS) test strip 1 each by Other route daily. Use as instructed  100 each  12  . KLOR-CON M20 20 MEQ tablet TAKE 1 TABLET BY MOUTH EVERY DAY  90 tablet  3  . LORazepam (ATIVAN) 0.5 MG tablet TAKE 2 TABLETS BY MOUTH TWICE DAILY AS NEEDED FOR ANXIETY  30 tablet  0  . metFORMIN (GLUCOPHAGE) 1000 MG tablet TAKE 1 TABLET BY MOUTH TWICE A DAY  180 tablet  0  . metoprolol  (LOPRESSOR) 50 MG tablet TAKE 1 TABLET EVERY DAY  90 tablet  3  . Multiple Vitamins-Minerals (CENTRUM SILVER PO) Take 1 tablet by mouth daily.        . OMEGA 3 1200 MG CAPS Take 1,200 mg by mouth daily.      Vladimir Faster Glycol-Propyl Glycol (SYSTANE ULTRA OP) Place 1-2 drops into both eyes every 2 (two) hours.      Marland Kitchen terazosin (HYTRIN) 5 MG capsule TAKE 1 CAPSULE EVERY DAY  90 capsule  3  . Triamcinolone Acetonide (NASACORT AQ NA) Place 2 sprays into the nose daily.        No current facility-administered medications on file prior to visit.    BP 120/64  Pulse 83  Temp(Src) 98 F (36.7 C) (Oral)  Resp 20  Ht 5\' 6"  (1.676 m)  Wt 161 lb (73.029 kg)  BMI 26.00 kg/m2  SpO2 95%   1. Risk factors, based on past  M,S,F history-cardiovascular risk factors include diabetes, hypertension, and dyslipidemia.  He does have a history of congestive heart failure.  2.  Physical activities: No major restrictions, although has been slowed down.  More recently, due to low back pain  3.  Depression/mood: History depression or mood disorder  4.  Hearing: No significant deficits  5.  ADL's: Totally independent  6.  Fall risk: Low  7.  Home safety: No problems identified  8.  Height weight, and visual acuity; height and weight stable.  Recent eye examination one week ago.  No history of diabetic neuropathy  9.  Counseling: More regular exercise modest weight loss encouraged  10. Lab orders based on risk factors: Laboratory profile reviewed including lipid panel and urine for microalbumin  11. Referral : Followup colonoscopy 12. Care plan: Followup colonoscopy.  Modest weight loss and exercise encouraged.  13. Cognitive assessment: Alert and alert with normal affect.  No cognitive dysfunction       Review of Systems  Constitutional: Negative for fever, chills, activity change, appetite change and fatigue.  HENT: Negative for congestion, dental problem, ear pain, hearing loss, mouth  sores, rhinorrhea, sinus pressure, sneezing, tinnitus, trouble swallowing and voice change.   Eyes: Negative for photophobia, pain, redness and visual disturbance.  Respiratory: Negative for apnea, cough, choking,  chest tightness, shortness of breath and wheezing.   Cardiovascular: Negative for chest pain, palpitations and leg swelling.  Gastrointestinal: Negative for nausea, vomiting, abdominal pain, diarrhea, constipation, blood in stool, abdominal distention, anal bleeding and rectal pain.  Genitourinary: Negative for dysuria, urgency, frequency, hematuria, flank pain, decreased urine volume, discharge, penile swelling, scrotal swelling, difficulty urinating, genital sores and testicular pain.  Musculoskeletal: Positive for back pain. Negative for arthralgias, gait problem, joint swelling, myalgias, neck pain and neck stiffness.  Skin: Negative for color change, rash and wound.  Neurological: Negative for dizziness, tremors, seizures, syncope, facial asymmetry, speech difficulty, weakness, light-headedness, numbness and headaches.  Hematological: Negative for adenopathy. Does not bruise/bleed easily.  Psychiatric/Behavioral: Negative for suicidal ideas, hallucinations, behavioral problems, confusion, sleep disturbance, self-injury, dysphoric mood, decreased concentration and agitation. The patient is not nervous/anxious.        Objective:   Physical Exam  Constitutional: He appears well-developed and well-nourished.  HENT:  Head: Normocephalic and atraumatic.  Right Ear: External ear normal.  Left Ear: External ear normal.  Nose: Nose normal.  Mouth/Throat: Oropharynx is clear and moist.  Eyes: Conjunctivae and EOM are normal. Pupils are equal, round, and reactive to light. No scleral icterus.  Neck: Normal range of motion. Neck supple. No JVD present. No thyromegaly present.  Cardiovascular: Regular rhythm, normal heart sounds and intact distal pulses.  Exam reveals no gallop and no  friction rub.   No murmur heard. Pulmonary/Chest: Effort normal and breath sounds normal. He exhibits no tenderness.  Abdominal: Soft. Bowel sounds are normal. He exhibits no distension and no mass. There is no tenderness.  Genitourinary: Penis normal. Guaiac negative stool.  Plus 2.  Enlargement  Musculoskeletal: Normal range of motion. He exhibits no edema and no tenderness.  Lymphadenopathy:    He has no cervical adenopathy.  Neurological: He is alert. He has normal reflexes. No cranial nerve deficit. Coordination normal.  Skin: Skin is warm and dry. No rash noted.  Psychiatric: He has a normal mood and affect. His behavior is normal.          Assessment & Plan:   Preventive health examination Diabetes mellitus.  We'll continue present regimen.  We'll increase level of exercise and attempt at a modest weight loss.  Recheck hemoglobin A1c in 3 months Hypertension well controlled History congestive heart failure, stable Dyslipidemia.  Continue statin therapy Remote history of Hodgkin's lymphoma.  Remains in remission

## 2013-08-17 NOTE — Progress Notes (Signed)
Pre-visit discussion using our clinic review tool. No additional management support is needed unless otherwise documented below in the visit note.  

## 2013-08-17 NOTE — Patient Instructions (Addendum)
Limit your sodium (Salt) intake'    It is important that you exercise regularly, at least 20 minutes 3 to 4 times per week.  If you develop chest pain or shortness of breath seek  medical attention.    It is important that you exercise regularly, at least 20 minutes 3 to 4 times per week.  If you develop chest pain or shortness of breath seek  medical attention.  You need to lose weight.  Consider a lower calorie diet and regular exercise.  Schedule your colonoscopy to help detect colon cancer.

## 2013-08-18 ENCOUNTER — Telehealth: Payer: Self-pay | Admitting: Internal Medicine

## 2013-08-18 NOTE — Telephone Encounter (Signed)
Relevant patient education assigned to patient using Emmi. ° °

## 2013-08-26 ENCOUNTER — Telehealth: Payer: Self-pay

## 2013-08-26 NOTE — Telephone Encounter (Signed)
Relevant patient education assigned to patient using Emmi. ° °

## 2013-08-29 ENCOUNTER — Other Ambulatory Visit: Payer: Self-pay | Admitting: Internal Medicine

## 2013-09-07 ENCOUNTER — Other Ambulatory Visit: Payer: Self-pay | Admitting: Internal Medicine

## 2013-09-23 ENCOUNTER — Ambulatory Visit (AMBULATORY_SURGERY_CENTER): Payer: Medicare Other

## 2013-09-23 VITALS — Ht 67.0 in | Wt 161.4 lb

## 2013-09-23 DIAGNOSIS — Z1211 Encounter for screening for malignant neoplasm of colon: Secondary | ICD-10-CM

## 2013-09-23 MED ORDER — NA SULFATE-K SULFATE-MG SULF 17.5-3.13-1.6 GM/177ML PO SOLN: ORAL | Status: DC

## 2013-09-23 NOTE — Progress Notes (Signed)
Per pt, no allergies to soy or egg products.Pt not taking any weight loss meds or using  O2 at home. 

## 2013-10-07 ENCOUNTER — Ambulatory Visit (AMBULATORY_SURGERY_CENTER): Payer: Medicare Other | Admitting: Gastroenterology

## 2013-10-07 ENCOUNTER — Encounter: Payer: Self-pay | Admitting: Gastroenterology

## 2013-10-07 VITALS — BP 125/69 | HR 79 | Temp 96.5°F | Resp 15 | Ht 63.0 in | Wt 177.0 lb

## 2013-10-07 DIAGNOSIS — Z1211 Encounter for screening for malignant neoplasm of colon: Secondary | ICD-10-CM

## 2013-10-07 DIAGNOSIS — I509 Heart failure, unspecified: Secondary | ICD-10-CM | POA: Diagnosis not present

## 2013-10-07 DIAGNOSIS — I10 Essential (primary) hypertension: Secondary | ICD-10-CM | POA: Diagnosis not present

## 2013-10-07 DIAGNOSIS — Z8673 Personal history of transient ischemic attack (TIA), and cerebral infarction without residual deficits: Secondary | ICD-10-CM | POA: Diagnosis not present

## 2013-10-07 DIAGNOSIS — K5730 Diverticulosis of large intestine without perforation or abscess without bleeding: Secondary | ICD-10-CM

## 2013-10-07 DIAGNOSIS — D126 Benign neoplasm of colon, unspecified: Secondary | ICD-10-CM

## 2013-10-07 LAB — GLUCOSE, CAPILLARY
Glucose-Capillary: 153 mg/dL — ABNORMAL HIGH (ref 70–99)
Glucose-Capillary: 80 mg/dL (ref 70–99)

## 2013-10-07 MED ORDER — SODIUM CHLORIDE 0.9 % IV SOLN: 500.0000 mL | INTRAVENOUS | Status: DC

## 2013-10-07 NOTE — Op Note (Signed)
[place]  [address]. [place], [address]   COLONOSCOPY PROCEDURE REPORT  PATIENT: [name], [name]  MR#: [id number] BIRTHDATE: 1950-08-10 , 71  yrs. old GENDER: Male ENDOSCOPIST: Inda Castle, MD REFERRED BY: PROCEDURE DATE:  10/07/2013 PROCEDURE:   Colonoscopy with snare polypectomy First Screening Colonoscopy - Avg.  risk and is 50 yrs.  old or older - No.  Prior Negative Screening - Now for repeat screening. 10 or more years since last screening  History of Adenoma - Now for follow-up colonoscopy & has been > or = to 3 yrs.  N/A  Polyps Removed Today? Yes. ASA CLASS:   Class II INDICATIONS:Average risk patient for colon cancer. MEDICATIONS: MAC sedation, administered by CRNA and propofol (Diprivan) 200mg  IV  DESCRIPTION OF PROCEDURE:   After the risks benefits and alternatives of the procedure were thoroughly explained, informed consent was obtained.  A digital rectal exam revealed no abnormalities of the rectum.   The [id number]  endoscope was introduced through the anus and advanced to the terminal ileum which was intubated for a short distance. No adverse events experienced.   The quality of the prep was excellent using Suprep The instrument was then slowly withdrawn as the colon was fully examined.      COLON FINDINGS: A sessile polyp measuring 8 mm in size was found in the descending colon.  A polypectomy was performed with a cold snare.  The resection was complete and the polyp tissue was completely retrieved.   Mild diverticulosis was noted in the transverse colon.   The colon was otherwise normal.  There was no diverticulosis, inflammation, polyps or cancers unless previously stated.  Retroflexed views revealed no abnormalities. The time to cecum=1 minutes 01 seconds.  Withdrawal time=9 minutes 38 seconds. The scope was withdrawn and the procedure completed. COMPLICATIONS: There were no complications.  ENDOSCOPIC  IMPRESSION: 1.   Sessile polyp measuring 8 mm in size was found in the descending colon; polypectomy was performed with a cold snare 2.   Mild diverticulosis was noted in the transverse colon 3.   The colon was otherwise normal  RECOMMENDATIONS: If the polyp(s) removed today are proven to be adenomatous (pre-cancerous) polyps, you will need a repeat colonoscopy in 5 years.  Otherwise you should continue to follow colorectal cancer screening guidelines for "routine risk" patients with colonoscopy in 10 years.  You will receive a letter within 1-2 weeks with the results of your biopsy as well as final recommendations.  Please call my office if you have not received a letter after 3 weeks.   eSigned:  Inda Castle, MD 10/07/2013 11:06 AM   cc: Marletta Lor, MD   PATIENT NAME:  [name], [name] MR#: [id number]

## 2013-10-07 NOTE — Progress Notes (Signed)
Called to room to assist during endoscopic procedure.  Patient ID and intended procedure confirmed with present staff. Received instructions for my participation in the procedure from the performing physician.  

## 2013-10-07 NOTE — Patient Instructions (Signed)
YOU HAD AN ENDOSCOPIC PROCEDURE TODAY AT THE [facility]: Refer to the procedure report that was given to you for any specific questions about what was found during the examination.  If the procedure report does not answer your questions, please call your gastroenterologist to clarify.  If you requested that your care partner not be given the details of your procedure findings, then the procedure report has been included in a sealed envelope for you to review at your convenience later.  YOU SHOULD EXPECT: Some feelings of bloating in the abdomen. Passage of more gas than usual.  Walking can help get rid of the air that was put into your GI tract during the procedure and reduce the bloating. If you had a lower endoscopy (such as a colonoscopy or flexible sigmoidoscopy) you may notice spotting of blood in your stool or on the toilet paper. If you underwent a bowel prep for your procedure, then you may not have a normal bowel movement for a few days.  DIET: Your first meal following the procedure should be a light meal and then it is ok to progress to your normal diet.  A half-sandwich or bowl of soup is an example of a good first meal.  Heavy or fried foods are harder to digest and may make you feel nauseous or bloated.  Likewise meals heavy in dairy and vegetables can cause extra gas to form and this can also increase the bloating.  Drink plenty of fluids but you should avoid alcoholic beverages for 24 hours.  ACTIVITY: Your care partner should take you home directly after the procedure.  You should plan to take it easy, moving slowly for the rest of the day.  You can resume normal activity the day after the procedure however you should NOT DRIVE or use heavy machinery for 24 hours (because of the sedation medicines used during the test).    SYMPTOMS TO REPORT IMMEDIATELY: A gastroenterologist can be reached at any hour.  During normal business hours, 8:30 AM to 5:00 PM Monday through Friday,  call [phone].  After hours and on weekends, please call the GI answering service at [phone] who will take a message and have the physician on call contact you.   Following lower endoscopy (colonoscopy or flexible sigmoidoscopy):  Excessive amounts of blood in the stool  Significant tenderness or worsening of abdominal pains  Swelling of the abdomen that is new, acute  Fever of 100F or higher    FOLLOW UP: If any biopsies were taken you will be contacted by phone or by letter within the next 1-3 weeks.  Call your gastroenterologist if you have not heard about the biopsies in 3 weeks.  Our staff will call the home number listed on your records the next business day following your procedure to check on you and address any questions or concerns that you may have at that time regarding the information given to you following your procedure. This is a courtesy call and so if there is no answer at the home number and we have not heard from you through the emergency physician on call, we will assume that you have returned to your regular daily activities without incident.  SIGNATURES/CONFIDENTIALITY: You and/or your care partner have signed paperwork which will be entered into your electronic medical record.  These signatures attest to the fact that that the information above on your After Visit Summary has been reviewed and is understood.  Full responsibility of the confidentiality   this discharge information lies with you and/or your care-partner.   INFORMATION ON POLYPS AND DIVERTICULOSIS AND HIGH FIBER DIET GIVEN TO YOU TODAY 

## 2013-10-07 NOTE — Progress Notes (Signed)
Report to pacu rn, vss, bbs=clear 

## 2013-10-08 ENCOUNTER — Other Ambulatory Visit: Payer: Self-pay | Admitting: Internal Medicine

## 2013-10-08 ENCOUNTER — Telehealth: Payer: Self-pay | Admitting: *Deleted

## 2013-10-08 NOTE — Telephone Encounter (Signed)
  Follow up Call-  Call back number 10/07/2013  Post procedure Call Back phone  # [phone]  Permission to leave phone message Yes     Patient questions:  Do you have a fever, pain , or abdominal swelling? no Pain Score  0 *  Have you tolerated food without any problems? yes  Have you been able to return to your normal activities? yes  Do you have any questions about your discharge instructions: Diet   no Medications  no Follow up visit  no  Do you have questions or concerns about your Care? no  Actions: * If pain score is 4 or above: No action needed, pain <4.

## 2013-10-13 ENCOUNTER — Encounter: Payer: Self-pay | Admitting: Gastroenterology

## 2013-10-30 ENCOUNTER — Other Ambulatory Visit: Payer: Self-pay | Admitting: Internal Medicine

## 2013-11-16 ENCOUNTER — Ambulatory Visit (INDEPENDENT_AMBULATORY_CARE_PROVIDER_SITE_OTHER): Payer: Medicare Other | Admitting: Internal Medicine

## 2013-11-16 ENCOUNTER — Encounter: Payer: Self-pay | Admitting: Internal Medicine

## 2013-11-16 VITALS — BP 132/70 | HR 95 | Temp 98.5°F | Resp 20 | Ht 63.0 in | Wt 163.0 lb

## 2013-11-16 DIAGNOSIS — E785 Hyperlipidemia, unspecified: Secondary | ICD-10-CM

## 2013-11-16 DIAGNOSIS — I10 Essential (primary) hypertension: Secondary | ICD-10-CM

## 2013-11-16 DIAGNOSIS — E119 Type 2 diabetes mellitus without complications: Secondary | ICD-10-CM

## 2013-11-16 LAB — HEMOGLOBIN A1C: HEMOGLOBIN A1C: 6.9 % — AB (ref 4.6–6.5)

## 2013-11-16 NOTE — Progress Notes (Signed)
Pre-visit discussion using our clinic review tool. No additional management support is needed unless otherwise documented below in the visit note.  

## 2013-11-16 NOTE — Patient Instructions (Signed)
Please check your hemoglobin A1c every 3 months  Limit your sodium (Salt) intake    It is important that you exercise regularly, at least 20 minutes 3 to 4 times per week.  If you develop chest pain or shortness of breath seek  medical attention.  You need to lose weight.  Consider a lower calorie diet and regular exercise. 

## 2013-11-16 NOTE — Progress Notes (Signed)
Subjective:    Patient ID: [name], male    DOB: [birth year], 63 y.o.   MRN: [id number]  HPI  Wt Readings from Last 3 Encounters:  11/16/13 163 lb (73.936 kg)  10/07/13 177 lb (80.287 kg)  09/23/13 161 lb 6.4 oz (73.24 kg)    63 year old patient seen today for his coronary followup.  He has type 2 diabetes.  A single A1c was up to 7 point 4 after being well controlled.  6 months prior.  Through the winter.  His activity level is down due to weather and low back pain.  Still having some back discomfort, but has been more active.  Back pain has improved modestly. Continues to track blood sugars at home with a fairly nice results. He has a history of dyslipidemia.  Recent lipid profile reviewed.  He was seen for his annual preventive health examination.  3 months ago.  He has treated hypertension, which has been stable.  No new concerns or complaints.  Last hemoglobin A1c 7 point 4  Past Medical History  Diagnosis Date  . 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 Hyperlipidemia   . CVA (cerebral vascular accident) 2005    hx of right brain subcortical stroke  . 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 Pneumonia Feb 2008  . Diabetes mellitus type II   . Arthritis     joints,   . H/O hiatal hernia   . Radiculopathy     S1  . Hypertension     Dr. Vidal Schwalbe cardiology  . Sinus congestion 06/19/11    pt has had sinus "infection" and just finished a z-pak  dr Anderson Malta aware    History   Social History  . Marital Status: Married    Spouse Name: N/A    Number of Children: N/A  . Years of Education: N/A   Occupational History  . Not on file.   Social History Main Topics  . Smoking status: Former Smoker -- 30 years    Quit date: 06/03/2004  . Smokeless tobacco: Never Used  . Alcohol Use: No     Comment: rarely  . Drug Use: No  . Sexual Activity: Not on file   Other Topics Concern  . Not on  file   Social History Narrative  . No narrative on file    Past Surgical History  Procedure Laterality Date  . Lymph node biospy  2000  . Colonoscopy  2003  . 2-d echocardiogram  April 2002    ejection fraction 55 to 65%  . Cataract extraction w/phaco  06/19/2011    Procedure: CATARACT EXTRACTION PHACO AND INTRAOCULAR LENS PLACEMENT (IOC);  Surgeon: Adonis Brook, MD;  Location: [facility];  Service: Ophthalmology;  Laterality: Right;  [place] FMA50BM +18.00 D Right    Family History  Problem Relation Age of Onset  . Diabetes Mother   . Heart disease Mother   . Heart failure Father   . Anesthesia problems Neg Hx   . Cancer Sister   . Cancer Brother     Allergies  Allergen Reactions  . Codeine Phosphate     Itching and gittery    Current Outpatient Prescriptions on File Prior to Visit  Medication Sig Dispense Refill  . ACCU-CHEK FASTCLIX LANCETS MISC 1 each by Does not apply route daily as needed.  102 each  5  . albuterol (VENTOLIN HFA) 108 (  90 BASE) MCG/ACT inhaler Inhale 2 puffs into the lungs every 4 (four) hours as needed for wheezing. Needs office visit for further refills  1 Inhaler  5  . amLODipine (NORVASC) 10 MG tablet TAKE 1 TABLET EVERY DAY  90 tablet  2  . aspirin 325 MG tablet Take 325 mg by mouth daily.        Marland Kitchen atorvastatin (LIPITOR) 10 MG tablet TAKE 1 TABLET BY MOUTH ONCE DAILY  90 tablet  3  . B-D UF III MINI PEN NEEDLES 31G X 5 MM MISC USE TWICE A DAY  100 each  5  . cetirizine (ZYRTEC) 10 MG tablet Take 10 mg by mouth as needed for allergies.      . clotrimazole (LOTRIMIN) 1 % cream Apply topically 2 (two) times daily.  30 g  0  . Dapagliflozin Propanediol (FARXIGA) 10 MG TABS Take 1 tablet by mouth daily.  30 tablet  6  . fexofenadine-pseudoephedrine (ALLEGRA-D) 60-120 MG per tablet Take 1 tablet by mouth daily as needed. For seasonal allergies      . fosinopril (MONOPRIL) 20 MG tablet TAKE 1 TABLET BY MOUTH TWICE DAILY  180 tablet  1  . glimepiride  (AMARYL) 2 MG tablet Take 2 mg by mouth daily before breakfast.      . glucose blood (ACCU-CHEK AVIVA PLUS) test strip 1 each by Other route daily. Use as instructed  100 each  12  . KLOR-CON M20 20 MEQ tablet TAKE 1 TABLET BY MOUTH EVERY DAY  90 tablet  3  . LORazepam (ATIVAN) 0.5 MG tablet TAKE 2 TABLETS BY MOUTH TWICE DAILY AS NEEDED FOR ANXIETY  30 tablet  0  . metFORMIN (GLUCOPHAGE) 1000 MG tablet TAKE 1 TABLET BY MOUTH TWICE A DAY  180 tablet  0  . metoprolol (LOPRESSOR) 50 MG tablet TAKE 1 TABLET BY MOUTH ONCE DAILY  90 tablet  2  . Multiple Vitamins-Minerals (CENTRUM SILVER PO) Take 1 tablet by mouth daily.        . OMEGA 3 1200 MG CAPS Take 1,200 mg by mouth daily.      Vladimir Faster Glycol-Propyl Glycol (SYSTANE ULTRA OP) Place 1-2 drops into both eyes as needed.       . terazosin (HYTRIN) 5 MG capsule TAKE 1 CAPSULE EVERY DAY  90 capsule  3  . Triamcinolone Acetonide (NASACORT AQ NA) Place 2 sprays into the nose as needed.        No current facility-administered medications on file prior to visit.    BP 132/70  Pulse 95  Temp(Src) 98.5 F (36.9 C) (Oral)  Resp 20  Ht 5\' 3"  (1.6 m)  Wt 163 lb (73.936 kg)  BMI 28.88 kg/m2  SpO2 96%      Review of Systems  Constitutional: Negative for fever, chills, appetite change and fatigue.  HENT: Negative for congestion, dental problem, ear pain, hearing loss, sore throat, tinnitus, trouble swallowing and voice change.   Eyes: Negative for pain, discharge and visual disturbance.  Respiratory: Negative for cough, chest tightness, wheezing and stridor.   Cardiovascular: Negative for chest pain, palpitations and leg swelling.  Gastrointestinal: Negative for nausea, vomiting, abdominal pain, diarrhea, constipation, blood in stool and abdominal distention.  Genitourinary: Negative for urgency, hematuria, flank pain, discharge, difficulty urinating and genital sores.  Musculoskeletal: Positive for back pain. Negative for arthralgias, gait  problem, joint swelling, myalgias and neck stiffness.  Skin: Negative for rash.  Neurological: Negative for dizziness, syncope, speech difficulty, weakness,  numbness and headaches.  Hematological: Negative for adenopathy. Does not bruise/bleed easily.  Psychiatric/Behavioral: Negative for behavioral problems and dysphoric mood. The patient is not nervous/anxious.        Objective:   Physical Exam  Constitutional: He is oriented to person, place, and time. He appears well-developed.  Blood pressure 130/70  HENT:  Head: Normocephalic.  Right Ear: External ear normal.  Left Ear: External ear normal.  Eyes: Conjunctivae and EOM are normal.  Neck: Normal range of motion.  Cardiovascular: Normal rate and normal heart sounds.   Pulmonary/Chest: Breath sounds normal.  Abdominal: Bowel sounds are normal.  Musculoskeletal: Normal range of motion. He exhibits no edema and no tenderness.  Neurological: He is alert and oriented to person, place, and time.  Psychiatric: He has a normal mood and affect. His behavior is normal.          Assessment & Plan:  Diabetes mellitus.  We'll check a hemoglobin A1c.  Has had a recent eye exam within the past 2 or 3 months.  Diabetic foot examination performed 3 months ago Hypertension stable Dyslipidemia.  Continue statin therapy Remote Hodgkin's disease.  Remains in remission CHF stable History of cerebral vascular disease

## 2013-11-24 ENCOUNTER — Other Ambulatory Visit: Payer: Self-pay | Admitting: Internal Medicine

## 2013-12-01 DIAGNOSIS — H2589 Other age-related cataract: Secondary | ICD-10-CM | POA: Diagnosis not present

## 2013-12-01 DIAGNOSIS — H02429 Myogenic ptosis of unspecified eyelid: Secondary | ICD-10-CM | POA: Diagnosis not present

## 2013-12-09 ENCOUNTER — Other Ambulatory Visit: Payer: Self-pay | Admitting: Internal Medicine

## 2014-01-04 ENCOUNTER — Other Ambulatory Visit: Payer: Self-pay | Admitting: Internal Medicine

## 2014-01-05 DIAGNOSIS — H2510 Age-related nuclear cataract, unspecified eye: Secondary | ICD-10-CM | POA: Diagnosis not present

## 2014-01-31 DIAGNOSIS — H2589 Other age-related cataract: Secondary | ICD-10-CM | POA: Diagnosis not present

## 2014-01-31 DIAGNOSIS — H2510 Age-related nuclear cataract, unspecified eye: Secondary | ICD-10-CM | POA: Diagnosis not present

## 2014-01-31 DIAGNOSIS — H269 Unspecified cataract: Secondary | ICD-10-CM | POA: Diagnosis not present

## 2014-02-16 ENCOUNTER — Ambulatory Visit (INDEPENDENT_AMBULATORY_CARE_PROVIDER_SITE_OTHER): Payer: Medicare Other | Admitting: Internal Medicine

## 2014-02-16 ENCOUNTER — Encounter: Payer: Self-pay | Admitting: Internal Medicine

## 2014-02-16 VITALS — BP 140/80 | HR 92 | Temp 97.8°F | Resp 20 | Ht 63.0 in | Wt 158.0 lb

## 2014-02-16 DIAGNOSIS — E785 Hyperlipidemia, unspecified: Secondary | ICD-10-CM | POA: Diagnosis not present

## 2014-02-16 DIAGNOSIS — I10 Essential (primary) hypertension: Secondary | ICD-10-CM | POA: Diagnosis not present

## 2014-02-16 DIAGNOSIS — I509 Heart failure, unspecified: Secondary | ICD-10-CM

## 2014-02-16 DIAGNOSIS — E119 Type 2 diabetes mellitus without complications: Secondary | ICD-10-CM | POA: Diagnosis not present

## 2014-02-16 LAB — HEMOGLOBIN A1C: Hgb A1c MFr Bld: 7.4 % — ABNORMAL HIGH (ref 4.6–6.5)

## 2014-02-16 NOTE — Progress Notes (Signed)
Subjective:    Patient ID: [name], male    DOB: [birth year], 63 y.o.   MRN: [id number]  HPI 63 year old patient who is seen today for his quarterly followup.  He has type 2 diabetes, controlled on oral agents.  Last hemoglobin A1c was 6 point 9.  Since his last visit here.  He has lost 5 pounds.  He has hypertension and dyslipidemia No cardiopulmonary complaints.  Does have a history of chemotherapy associated congestive heart failure Has had recent cataract surgery and eye examination  Past Medical History  Diagnosis Date  . 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 Hyperlipidemia   . CVA (cerebral vascular accident) 2005    hx of right brain subcortical stroke  . 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 Pneumonia Feb 2008  . Diabetes mellitus type II   . Arthritis     joints,   . H/O hiatal hernia   . Radiculopathy     S1  . Hypertension     Dr. Vidal Schwalbe cardiology  . Sinus congestion 06/19/11    pt has had sinus "infection" and just finished a z-pak  dr Anderson Malta aware    History   Social History  . Marital Status: Married    Spouse Name: N/A    Number of Children: N/A  . Years of Education: N/A   Occupational History  . Not on file.   Social History Main Topics  . Smoking status: Former Smoker -- 30 years    Quit date: 06/03/2004  . Smokeless tobacco: Never Used  . Alcohol Use: No     Comment: rarely  . Drug Use: No  . Sexual Activity: Not on file   Other Topics Concern  . Not on file   Social History Narrative  . No narrative on file    Past Surgical History  Procedure Laterality Date  . Lymph node biospy  2000  . Colonoscopy  2003  . 2-d echocardiogram  April 2002    ejection fraction 55 to 65%  . Cataract extraction w/phaco  06/19/2011    Procedure: CATARACT EXTRACTION PHACO AND INTRAOCULAR LENS PLACEMENT (IOC);  Surgeon: Adonis Brook, MD;  Location: [facility];  Service:  Ophthalmology;  Laterality: Right;  [place] FMA50BM +18.00 D Right    Family History  Problem Relation Age of Onset  . Diabetes Mother   . Heart disease Mother   . Heart failure Father   . Anesthesia problems Neg Hx   . Cancer Sister   . Cancer Brother     Allergies  Allergen Reactions  . Codeine Phosphate     Itching and gittery    Current Outpatient Prescriptions on File Prior to Visit  Medication Sig Dispense Refill  . ACCU-CHEK FASTCLIX LANCETS MISC 1 each by Does not apply route daily as needed.  102 each  5  . albuterol (VENTOLIN HFA) 108 (90 BASE) MCG/ACT inhaler Inhale 2 puffs into the lungs every 4 (four) hours as needed for wheezing. Needs office visit for further refills  1 Inhaler  5  . amLODipine (NORVASC) 10 MG tablet TAKE 1 TABLET EVERY DAY  90 tablet  2  . aspirin 325 MG tablet Take 325 mg by mouth daily.        Marland Kitchen atorvastatin (LIPITOR) 10 MG tablet TAKE 1 TABLET BY MOUTH ONCE DAILY  90 tablet  3  . B-D UF  III MINI PEN NEEDLES 31G X 5 MM MISC USE TWICE A DAY  100 each  5  . cetirizine (ZYRTEC) 10 MG tablet Take 10 mg by mouth as needed for allergies.      . clotrimazole (LOTRIMIN) 1 % cream Apply topically 2 (two) times daily.  30 g  0  . Dapagliflozin Propanediol (FARXIGA) 10 MG TABS Take 1 tablet by mouth daily.  30 tablet  6  . fexofenadine-pseudoephedrine (ALLEGRA-D) 60-120 MG per tablet Take 1 tablet by mouth daily as needed. For seasonal allergies      . fosinopril (MONOPRIL) 20 MG tablet TAKE 1 TABLET BY MOUTH TWICE DAILY  180 tablet  1  . glimepiride (AMARYL) 2 MG tablet Take 2 mg by mouth daily before breakfast.      . glucose blood (ACCU-CHEK AVIVA PLUS) test strip 1 each by Other route daily. Use as instructed  100 each  12  . KLOR-CON M20 20 MEQ tablet TAKE 1 TABLET BY MOUTH EVERY DAY  90 tablet  3  . LORazepam (ATIVAN) 0.5 MG tablet TAKE 2 TABLETS BY MOUTH TWICE DAILY AS NEEDED FOR ANXIETY  30 tablet  0  . metFORMIN (GLUCOPHAGE) 1000 MG tablet  TAKE 1 TABLET BY MOUTH TWICE A DAY  180 tablet  2  . metoprolol (LOPRESSOR) 50 MG tablet TAKE 1 TABLET BY MOUTH ONCE DAILY  90 tablet  2  . Multiple Vitamins-Minerals (CENTRUM SILVER PO) Take 1 tablet by mouth daily.        . OMEGA 3 1200 MG CAPS Take 1,200 mg by mouth daily.      Vladimir Faster Glycol-Propyl Glycol (SYSTANE ULTRA OP) Place 1-2 drops into both eyes as needed.       . terazosin (HYTRIN) 5 MG capsule TAKE 1 CAPSULE EVERY DAY  90 capsule  3  . Triamcinolone Acetonide (NASACORT AQ NA) Place 2 sprays into the nose as needed.        No current facility-administered medications on file prior to visit.    BP 140/80  Pulse 92  Temp(Src) 97.8 F (36.6 C) (Oral)  Resp 20  Ht 5\' 3"  (1.6 m)  Wt 158 lb (71.668 kg)  BMI 28.00 kg/m2  SpO2 97%      Review of Systems  Constitutional: Negative for fever, chills, appetite change and fatigue.  HENT: Negative for congestion, dental problem, ear pain, hearing loss, sore throat, tinnitus, trouble swallowing and voice change.   Eyes: Negative for pain, discharge and visual disturbance.  Respiratory: Negative for cough, chest tightness, wheezing and stridor.   Cardiovascular: Negative for chest pain, palpitations and leg swelling.  Gastrointestinal: Negative for nausea, vomiting, abdominal pain, diarrhea, constipation, blood in stool and abdominal distention.  Genitourinary: Negative for urgency, hematuria, flank pain, discharge, difficulty urinating and genital sores.  Musculoskeletal: Negative for arthralgias, back pain, gait problem, joint swelling, myalgias and neck stiffness.  Skin: Negative for rash.  Neurological: Negative for dizziness, syncope, speech difficulty, weakness, numbness and headaches.  Hematological: Negative for adenopathy. Does not bruise/bleed easily.  Psychiatric/Behavioral: Negative for behavioral problems and dysphoric mood. The patient is not nervous/anxious.        Objective:   Physical Exam    Constitutional: He is oriented to person, place, and time. He appears well-developed.  Repeat blood pressure 135/75  HENT:  Head: Normocephalic.  Right Ear: External ear normal.  Left Ear: External ear normal.  Eyes: Conjunctivae and EOM are normal.  Neck: Normal range of motion.  Cardiovascular:  Normal rate and normal heart sounds.   Pulmonary/Chest: Breath sounds normal.  Abdominal: Bowel sounds are normal.  Musculoskeletal: Normal range of motion. He exhibits no edema and no tenderness.  Neurological: He is alert and oriented to person, place, and time.  Psychiatric: He has a normal mood and affect. His behavior is normal.          Assessment & Plan:   Diabetes mellitus.  We'll check a hemoglobin A1c Hypertension stable.  No change in therapy Congestive heart failure.  Asymptomatic Dyslipidemia.  Continue statin therapy  Check hemoglobin A1c Additional weight loss encouraged More vigorous exercise regimen stressed  Recheck 3 months

## 2014-02-16 NOTE — Progress Notes (Signed)
   Subjective:    Patient ID: [name], male    DOB: 1951/02/04, 63 y.o.   MRN: [id number]  HPI  Wt Readings from Last 3 Encounters:  02/16/14 158 lb (71.668 kg)  11/16/13 163 lb (73.936 kg)  10/07/13 177 lb (80.287 kg)    Review of Systems     Objective:   Physical Exam        Assessment & Plan:

## 2014-02-16 NOTE — Progress Notes (Signed)
Pre visit review using our clinic review tool, if applicable. No additional management support is needed unless otherwise documented below in the visit note. 

## 2014-02-16 NOTE — Patient Instructions (Signed)
Please check your hemoglobin A1c every 3 months  Limit your sodium (Salt) intake  Please check your blood pressure on a regular basis.  If it is consistently greater than 150/90, please make an office appointment.

## 2014-02-23 ENCOUNTER — Other Ambulatory Visit: Payer: Self-pay | Admitting: Internal Medicine

## 2014-03-15 ENCOUNTER — Other Ambulatory Visit: Payer: Self-pay | Admitting: Internal Medicine

## 2014-04-04 ENCOUNTER — Other Ambulatory Visit: Payer: Self-pay | Admitting: Internal Medicine

## 2014-04-19 ENCOUNTER — Ambulatory Visit (INDEPENDENT_AMBULATORY_CARE_PROVIDER_SITE_OTHER): Payer: Medicare Other

## 2014-04-19 ENCOUNTER — Ambulatory Visit: Payer: Medicare Other

## 2014-04-19 DIAGNOSIS — Z23 Encounter for immunization: Secondary | ICD-10-CM | POA: Diagnosis not present

## 2014-05-03 ENCOUNTER — Encounter: Payer: Self-pay | Admitting: *Deleted

## 2014-05-03 ENCOUNTER — Ambulatory Visit (INDEPENDENT_AMBULATORY_CARE_PROVIDER_SITE_OTHER): Payer: Medicare Other | Admitting: Internal Medicine

## 2014-05-03 ENCOUNTER — Encounter: Payer: Self-pay | Admitting: Internal Medicine

## 2014-05-03 VITALS — BP 140/72 | HR 84 | Temp 98.0°F | Resp 20 | Ht 66.0 in | Wt 161.0 lb

## 2014-05-03 DIAGNOSIS — E1059 Type 1 diabetes mellitus with other circulatory complications: Secondary | ICD-10-CM

## 2014-05-03 DIAGNOSIS — J069 Acute upper respiratory infection, unspecified: Secondary | ICD-10-CM

## 2014-05-03 DIAGNOSIS — I10 Essential (primary) hypertension: Secondary | ICD-10-CM

## 2014-05-03 MED ORDER — FLUTICASONE PROPIONATE 50 MCG/ACT NA SUSP: 2.0000 | Freq: Every day | NASAL | Status: DC

## 2014-05-03 MED ORDER — HYDROCOD POLST-CHLORPHEN POLST 10-8 MG/5ML PO LQCR: 5.0000 mL | Freq: Two times a day (BID) | ORAL | Status: DC | PRN

## 2014-05-03 NOTE — Progress Notes (Signed)
Pre visit review using our clinic review tool, if applicable. No additional management support is needed unless otherwise documented below in the visit note. 

## 2014-05-03 NOTE — Progress Notes (Signed)
Subjective:    Patient ID: [name], male    DOB: 01-21-1951, 63 y.o.   MRN: [id number]  HPI Wt Readings from Last 3 Encounters:  05/03/14 161 lb (73.029 kg)  02/16/14 158 lb (71.668 kg)  11/16/13 163 lb (73.21 kg)   63 year old patient who is seen today with complaints of nasal congestion, postnasal drip and mild cough over the past 3-4 weeks.  There is been no fever.  He does have a history of allergic rhinitis.  He has treated hypertension and diabetes.  Past Medical History  Diagnosis Date  . 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 Hyperlipidemia   . CVA (cerebral vascular accident) 2005    hx of right brain subcortical stroke  . 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 Pneumonia Feb 2008  . Diabetes mellitus type II   . Arthritis     joints,   . H/O hiatal hernia   . Radiculopathy     S1  . Hypertension     Dr. Vidal Schwalbe cardiology  . Sinus congestion 06/19/11    pt has had sinus "infection" and just finished a z-pak  dr Anderson Malta aware    History   Social History  . Marital Status: Married    Spouse Name: N/A    Number of Children: N/A  . Years of Education: N/A   Occupational History  . Not on file.   Social History Main Topics  . Smoking status: Former Smoker -- 30 years    Quit date: 06/03/2004  . Smokeless tobacco: Never Used  . Alcohol Use: No     Comment: rarely  . Drug Use: No  . Sexual Activity: Not on file   Other Topics Concern  . Not on file   Social History Narrative    Past Surgical History  Procedure Laterality Date  . Lymph node biospy  2000  . Colonoscopy  2003  . 2-d echocardiogram  April 2002    ejection fraction 55 to 65%  . Cataract extraction w/phaco  06/19/2011    Procedure: CATARACT EXTRACTION PHACO AND INTRAOCULAR LENS PLACEMENT (IOC);  Surgeon: Adonis Brook, MD;  Location: [facility];  Service: Ophthalmology;  Laterality: Right;  [place]  FMA50BM +18.00 D Right    Family History  Problem Relation Age of Onset  . Diabetes Mother   . Heart disease Mother   . Heart failure Father   . Anesthesia problems Neg Hx   . Cancer Sister   . Cancer Brother     Allergies  Allergen Reactions  . Codeine Phosphate     Itching and gittery    Current Outpatient Prescriptions on File Prior to Visit  Medication Sig Dispense Refill  . ACCU-CHEK FASTCLIX LANCETS MISC 1 each by Does not apply route daily as needed. 102 each 5  . amLODipine (NORVASC) 10 MG tablet TAKE 1 TABLET BY MOUTH ONCE DAILY 90 tablet 2  . aspirin 325 MG tablet Take 325 mg by mouth daily.      Marland Kitchen atorvastatin (LIPITOR) 10 MG tablet TAKE 1 TABLET BY MOUTH ONCE DAILY 90 tablet 3  . B-D UF III MINI PEN NEEDLES 31G X 5 MM MISC USE TWICE A DAY 100 each 5  . cetirizine (ZYRTEC) 10 MG tablet Take 10 mg by mouth as needed for allergies.    . clotrimazole (LOTRIMIN) 1 % cream Apply topically 2 (two) times  daily. 30 g 0  . Dapagliflozin Propanediol (FARXIGA) 10 MG TABS Take 1 tablet by mouth daily. 30 tablet 6  . fexofenadine-pseudoephedrine (ALLEGRA-D) 60-120 MG per tablet Take 1 tablet by mouth daily as needed. For seasonal allergies    . fosinopril (MONOPRIL) 20 MG tablet TAKE 1 TABLET BY MOUTH TWICE DAILY 180 tablet 1  . glimepiride (AMARYL) 2 MG tablet Take 2 mg by mouth daily before breakfast.    . glucose blood (ACCU-CHEK AVIVA PLUS) test strip 1 each by Other route daily. Use as instructed 100 each 12  . KLOR-CON M20 20 MEQ tablet TAKE 1 TABLET BY MOUTH EVERY DAY 90 tablet 3  . LORazepam (ATIVAN) 0.5 MG tablet TAKE 2 TABLETS BY MOUTH TWICE DAILY AS NEEDED 60 tablet 1  . metFORMIN (GLUCOPHAGE) 1000 MG tablet TAKE 1 TABLET BY MOUTH TWICE A DAY 180 tablet 2  . metoprolol (LOPRESSOR) 50 MG tablet TAKE 1 TABLET BY MOUTH ONCE DAILY 90 tablet 2  . Multiple Vitamins-Minerals (CENTRUM SILVER PO) Take 1 tablet by mouth daily.      . OMEGA 3 1200 MG CAPS Take 1,200 mg by mouth  daily.    Vladimir Faster Glycol-Propyl Glycol (SYSTANE ULTRA OP) Place 1-2 drops into both eyes as needed.     . terazosin (HYTRIN) 5 MG capsule TAKE 1 CAPSULE EVERY DAY 90 capsule 3  . Triamcinolone Acetonide (NASACORT AQ NA) Place 2 sprays into the nose as needed.     . VENTOLIN HFA 108 (90 BASE) MCG/ACT inhaler INHALE 2 PUFFS EVERY 4 HOURS AS NEEDED FOR WHEEZING (NEEDS OFFICE VISIT) 18 each 1   No current facility-administered medications on file prior to visit.    BP 140/72 mmHg  Pulse 84  Temp(Src) 98 F (36.7 C) (Oral)  Resp 20  Ht 5\' 6"  (1.676 m)  Wt 161 lb (73.029 kg)  BMI 26.00 kg/m2  SpO2 96%      Review of Systems  Constitutional: Positive for activity change. Negative for fever, chills, appetite change and fatigue.  HENT: Positive for congestion, postnasal drip, rhinorrhea and sinus pressure. Negative for dental problem, ear pain, hearing loss, sore throat, tinnitus, trouble swallowing and voice change.   Eyes: Negative for pain, discharge and visual disturbance.  Respiratory: Positive for cough. Negative for chest tightness, wheezing and stridor.   Cardiovascular: Negative for chest pain, palpitations and leg swelling.  Gastrointestinal: Negative for nausea, vomiting, abdominal pain, diarrhea, constipation, blood in stool and abdominal distention.  Genitourinary: Negative for urgency, hematuria, flank pain, discharge, difficulty urinating and genital sores.  Musculoskeletal: Negative for myalgias, back pain, joint swelling, arthralgias, gait problem and neck stiffness.  Skin: Negative for rash.  Neurological: Negative for dizziness, syncope, speech difficulty, weakness, numbness and headaches.  Hematological: Negative for adenopathy. Does not bruise/bleed easily.  Psychiatric/Behavioral: Negative for behavioral problems and dysphoric mood. The patient is not nervous/anxious.        Objective:   Physical Exam  Constitutional: He is oriented to person, place, and  time. He appears well-developed.  HENT:  Head: Normocephalic.  Right Ear: External ear normal.  Left Ear: External ear normal.  Eyes: Conjunctivae and EOM are normal.  Neck: Normal range of motion.  Cardiovascular: Normal rate and normal heart sounds.   Pulmonary/Chest: Effort normal and breath sounds normal. No respiratory distress. He has no wheezes. He has no rales.  Abdominal: Bowel sounds are normal.  Musculoskeletal: Normal range of motion. He exhibits no edema or tenderness.  Neurological: He is  alert and oriented to person, place, and time.  Psychiatric: He has a normal mood and affect. His behavior is normal.          Assessment & Plan:   Viral URI with cough Allergic rhinitis.  Will continue symptomatic treatment Hypertension, stable Diabetes mellitus.  Will follow-up next month with hemoglobin A1c.  Weight loss encouraged.

## 2014-05-03 NOTE — Patient Instructions (Signed)
Take over-the-counter expectorants and cough medications such as  Mucinex DM.  Call if there is no improvement in 5 to 7 days or if  you develop worsening cough, fever, or new symptoms, such as shortness of breath or chest pain. 

## 2014-05-15 ENCOUNTER — Other Ambulatory Visit: Payer: Self-pay | Admitting: Internal Medicine

## 2014-05-21 ENCOUNTER — Other Ambulatory Visit: Payer: Self-pay | Admitting: Internal Medicine

## 2014-05-24 ENCOUNTER — Other Ambulatory Visit: Payer: Self-pay | Admitting: Internal Medicine

## 2014-05-29 ENCOUNTER — Other Ambulatory Visit: Payer: Self-pay | Admitting: Internal Medicine

## 2014-06-02 DIAGNOSIS — R0789 Other chest pain: Secondary | ICD-10-CM | POA: Diagnosis not present

## 2014-06-02 DIAGNOSIS — Z87891 Personal history of nicotine dependence: Secondary | ICD-10-CM | POA: Diagnosis not present

## 2014-06-02 DIAGNOSIS — E780 Pure hypercholesterolemia: Secondary | ICD-10-CM | POA: Diagnosis present

## 2014-06-02 DIAGNOSIS — J9601 Acute respiratory failure with hypoxia: Secondary | ICD-10-CM | POA: Diagnosis not present

## 2014-06-02 DIAGNOSIS — J189 Pneumonia, unspecified organism: Secondary | ICD-10-CM | POA: Diagnosis not present

## 2014-06-02 DIAGNOSIS — A419 Sepsis, unspecified organism: Secondary | ICD-10-CM | POA: Diagnosis not present

## 2014-06-02 DIAGNOSIS — J439 Emphysema, unspecified: Secondary | ICD-10-CM | POA: Diagnosis not present

## 2014-06-02 DIAGNOSIS — R0602 Shortness of breath: Secondary | ICD-10-CM | POA: Diagnosis not present

## 2014-06-02 DIAGNOSIS — E119 Type 2 diabetes mellitus without complications: Secondary | ICD-10-CM | POA: Diagnosis not present

## 2014-06-02 DIAGNOSIS — N289 Disorder of kidney and ureter, unspecified: Secondary | ICD-10-CM | POA: Diagnosis present

## 2014-06-02 DIAGNOSIS — I10 Essential (primary) hypertension: Secondary | ICD-10-CM | POA: Diagnosis not present

## 2014-06-06 ENCOUNTER — Telehealth: Payer: Self-pay | Admitting: Internal Medicine

## 2014-06-06 NOTE — Telephone Encounter (Signed)
Pt was hospitalized while in [place] and they gave him meds for a nebulizer which he does not have. Pt called to ask for a rx to get a nebulizer. He was hospitalize for 5 days

## 2014-06-07 NOTE — Telephone Encounter (Signed)
Left message on voicemail to call office.  

## 2014-06-08 NOTE — Telephone Encounter (Signed)
Spoke to pt, told him he has an inhaler ordered, but not nebulizer. Pt stated he was in the hospital in [facility] and was given new inhaler Symbicort and Rx for nebulizer medications but no Nebulizer. Told pt needs to contact doctor that prescribed medication to see if needs Nebulizer so they can give him an order. Pt verbalized understanding.

## 2014-06-08 NOTE — Telephone Encounter (Signed)
Patient returned your call.

## 2014-06-10 ENCOUNTER — Ambulatory Visit (HOSPITAL_BASED_OUTPATIENT_CLINIC_OR_DEPARTMENT_OTHER): Payer: Medicare Other | Admitting: Oncology

## 2014-06-10 ENCOUNTER — Telehealth: Payer: Self-pay | Admitting: Oncology

## 2014-06-10 VITALS — BP 136/57 | HR 110 | Temp 97.6°F | Resp 18 | Ht 66.0 in | Wt 160.4 lb

## 2014-06-10 DIAGNOSIS — C8190 Hodgkin lymphoma, unspecified, unspecified site: Secondary | ICD-10-CM

## 2014-06-10 DIAGNOSIS — Z8571 Personal history of Hodgkin lymphoma: Secondary | ICD-10-CM

## 2014-06-10 NOTE — Progress Notes (Signed)
  [facility] OFFICE PROGRESS NOTE   Diagnosis: Hodgkin's lymphoma  INTERVAL HISTORY:   [name] returns as scheduled. He reports being admitted to the hospital in [place] last week with "pneumonia ". He completed an outpatient course of antibiotics. He feels well today. Nodular skin lesions, and go. No consistent lesions. No other complaint.  Objective:  Vital signs in last 24 hours:  Blood pressure 136/57, pulse 110, temperature 97.6 F (36.4 C), temperature source Oral, resp. rate 18, height 5\' 6"  (1.676 m), weight 160 lb 6.4 oz (72.757 kg), SpO2 92 %.    HEENT: Neck without mass Lymphatics: No cervical, supra-clavicular, axillary, or inguinal nodes Resp: Distant breath sounds, no respiratory distress Cardio: Regular rate and rhythm GI: No hepatosplenomegaly Vascular: No leg edema  Skin: No nodular skin lesions are apparent over the trunk or arms     Lab Results:  Lab Results  Component Value Date   WBC 4.8 08/10/2013   HGB 14.5 08/10/2013   HCT 45.2 08/10/2013   MCV 95.3 08/10/2013   PLT 305.0 08/10/2013   NEUTROABS 2.6 08/10/2013    Medications: I have reviewed the patient's current medications.  Assessment/Plan: 1. Hodgkins lymphoma , stage IV, diagnosed in May 2000 - he remains in clinical remission. 2. History of a nodular skin rash with a biopsy August 02, 2006, confirming a CD30 positive lymphoproliferative disorder. He reports intermittent nodular skin lesions over the past few years that spontaneously resolve and occur months apart. No apparent lesions today.    Disposition:  [name] remains in remission from Hodgkin's lymphoma. There is no evidence of a progressive cutaneous lymphoma. He will return for an office visit in one year. He will contact us in the interim for persistent skin lesions or new symptoms.  David Coder, MD  06/10/2014  10:51 AM

## 2014-06-10 NOTE — Telephone Encounter (Signed)
Gave avs & cal for [name] 2017 °

## 2014-06-14 ENCOUNTER — Ambulatory Visit (INDEPENDENT_AMBULATORY_CARE_PROVIDER_SITE_OTHER): Payer: Medicare Other | Admitting: Internal Medicine

## 2014-06-14 ENCOUNTER — Encounter: Payer: Self-pay | Admitting: Internal Medicine

## 2014-06-14 VITALS — BP 130/70 | HR 81 | Temp 98.1°F | Resp 20 | Ht 66.0 in | Wt 161.0 lb

## 2014-06-14 DIAGNOSIS — J189 Pneumonia, unspecified organism: Secondary | ICD-10-CM | POA: Diagnosis not present

## 2014-06-14 DIAGNOSIS — I5020 Unspecified systolic (congestive) heart failure: Secondary | ICD-10-CM

## 2014-06-14 DIAGNOSIS — E785 Hyperlipidemia, unspecified: Secondary | ICD-10-CM

## 2014-06-14 DIAGNOSIS — J069 Acute upper respiratory infection, unspecified: Secondary | ICD-10-CM

## 2014-06-14 DIAGNOSIS — I10 Essential (primary) hypertension: Secondary | ICD-10-CM | POA: Diagnosis not present

## 2014-06-14 DIAGNOSIS — E1059 Type 1 diabetes mellitus with other circulatory complications: Secondary | ICD-10-CM

## 2014-06-14 NOTE — Progress Notes (Signed)
Pre visit review using our clinic review tool, if applicable. No additional management support is needed unless otherwise documented below in the visit note. 

## 2014-06-14 NOTE — Patient Instructions (Signed)
Chest x-ray as discussed  Call or return to clinic prn if these symptoms worsen or fail to improve as anticipated.

## 2014-06-14 NOTE — Progress Notes (Signed)
Subjective:    Patient ID: [name], male    DOB: 31-May-1951, 64 y.o.   MRN: [id number]  HPI  64 year old patient who is seen following a recent hospital discharge in [place], [place], for apparent community-acquired pneumonia.  He has completed Levaquin and generally feels well.  He was discharged on inhalational bronchodilators.  He generally feels well but a bit weak.  No wheezing or productive cough.  No fever, although he states he had no fever with his acute illness.  He does have a history of congestive heart failure related to prior chemotherapy for Hodgkin's disease.  Past Medical History  Diagnosis Date  . 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 Hyperlipidemia   . CVA (cerebral vascular accident) 2005    hx of right brain subcortical stroke  . 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 Pneumonia Feb 2008  . Diabetes mellitus type II   . Arthritis     joints,   . H/O hiatal hernia   . Radiculopathy     S1  . Hypertension     Dr. Vidal Schwalbe cardiology  . Sinus congestion 06/19/11    pt has had sinus "infection" and just finished a z-pak  dr Anderson Malta aware    History   Social History  . Marital Status: Married    Spouse Name: N/A    Number of Children: N/A  . Years of Education: N/A   Occupational History  . Not on file.   Social History Main Topics  . Smoking status: Former Smoker -- 30 years    Quit date: 06/03/2004  . Smokeless tobacco: Never Used  . Alcohol Use: No     Comment: rarely  . Drug Use: No  . Sexual Activity: Not on file   Other Topics Concern  . Not on file   Social History Narrative    Past Surgical History  Procedure Laterality Date  . Lymph node biospy  2000  . Colonoscopy  2003  . 2-d echocardiogram  April 2002    ejection fraction 55 to 65%  . Cataract extraction w/phaco  06/19/2011    Procedure: CATARACT EXTRACTION PHACO AND INTRAOCULAR LENS  PLACEMENT (IOC);  Surgeon: Adonis Brook, MD;  Location: [facility];  Service: Ophthalmology;  Laterality: Right;  [place] FMA50BM +18.00 D Right    Family History  Problem Relation Age of Onset  . Diabetes Mother   . Heart disease Mother   . Heart failure Father   . Anesthesia problems Neg Hx   . Cancer Sister   . Cancer Brother     Allergies  Allergen Reactions  . Codeine Phosphate     Itching and gittery    Current Outpatient Prescriptions on File Prior to Visit  Medication Sig Dispense Refill  . ACCU-CHEK FASTCLIX LANCETS MISC 1 each by Does not apply route daily as needed. 102 each 5  . amLODipine (NORVASC) 10 MG tablet TAKE 1 TABLET BY MOUTH ONCE DAILY 90 tablet 2  . aspirin 325 MG tablet Take 325 mg by mouth daily.      Marland Kitchen atorvastatin (LIPITOR) 10 MG tablet TAKE 1 TABLET BY MOUTH ONCE DAILY 90 tablet 3  . B-D UF III MINI PEN NEEDLES 31G X 5 MM MISC USE TWICE A DAY 100 each 5  . benzonatate (TESSALON) 100 MG capsule Take 100 mg by mouth 2 (two) times daily as needed.     Marland Kitchen  cetirizine (ZYRTEC) 10 MG tablet Take 10 mg by mouth as needed for allergies.    . clotrimazole (LOTRIMIN) 1 % cream Apply topically 2 (two) times daily. 30 g 0  . FARXIGA 10 MG TABS tablet TAKE 1 TABLET EVERY DAY 90 tablet 1  . fexofenadine-pseudoephedrine (ALLEGRA-D) 60-120 MG per tablet Take 1 tablet by mouth daily as needed. For seasonal allergies    . fluticasone (FLONASE) 50 MCG/ACT nasal spray Place 2 sprays into both nostrils daily. 16 g 6  . fosinopril (MONOPRIL) 20 MG tablet TAKE 1 TABLET BY MOUTH TWICE DAILY 180 tablet 1  . glimepiride (AMARYL) 2 MG tablet Take 2 mg by mouth daily before breakfast.    . glucose blood (ACCU-CHEK AVIVA PLUS) test strip 1 each by Other route daily. Use as instructed 100 each 12  . ipratropium-albuterol (DUONEB) 0.5-2.5 (3) MG/3ML SOLN     . KLOR-CON M20 20 MEQ tablet TAKE 1 TABLET BY MOUTH EVERY DAY 90 tablet 3  . LORazepam (ATIVAN) 0.5 MG tablet TAKE 2 TABLETS BY  MOUTH TWICE DAILY AS NEEDED 60 tablet 1  . metFORMIN (GLUCOPHAGE) 1000 MG tablet TAKE 1 TABLET BY MOUTH TWICE A DAY 180 tablet 2  . metoprolol (LOPRESSOR) 50 MG tablet TAKE 1 TABLET BY MOUTH ONCE DAILY 90 tablet 2  . Multiple Vitamins-Minerals (CENTRUM SILVER PO) Take 1 tablet by mouth daily.      . OMEGA 3 1200 MG CAPS Take 1,200 mg by mouth daily.    Vladimir Faster Glycol-Propyl Glycol (SYSTANE ULTRA OP) Place 1-2 drops into both eyes as needed.     Marland Kitchen SPIRIVA HANDIHALER 18 MCG inhalation capsule Place 18 mcg into inhaler and inhale daily as needed.     . terazosin (HYTRIN) 5 MG capsule TAKE 1 CAPSULE BY MOUTH ONCE DAILY 90 capsule 3  . Triamcinolone Acetonide (NASACORT AQ NA) Place 2 sprays into the nose as needed.     . VENTOLIN HFA 108 (90 BASE) MCG/ACT inhaler INHALE 2 PUFFS EVERY 4 HOURS AS NEEDED FOR WHEEZING (NEEDS OFFICE VISIT) 18 each 1  . chlorpheniramine-HYDROcodone (TUSSIONEX) 10-8 MG/5ML LQCR Take 5 mLs by mouth every 12 (twelve) hours as needed for cough. (Patient not taking: Reported on 06/14/2014) 115 mL 0   No current facility-administered medications on file prior to visit.    BP 130/70 mmHg  Pulse 81  Temp(Src) 98.1 F (36.7 C) (Oral)  Resp 20  Ht 5\' 6"  (1.676 m)  Wt 161 lb (73.029 kg)  BMI 26.00 kg/m2  SpO2 94%     Review of Systems  Constitutional: Positive for activity change, appetite change and fatigue. Negative for fever and chills.  HENT: Negative for congestion, dental problem, ear pain, hearing loss, sore throat, tinnitus, trouble swallowing and voice change.   Eyes: Negative for pain, discharge and visual disturbance.  Respiratory: Negative for cough, chest tightness, wheezing and stridor.   Cardiovascular: Negative for chest pain, palpitations and leg swelling.  Gastrointestinal: Negative for nausea, vomiting, abdominal pain, diarrhea, constipation, blood in stool and abdominal distention.  Genitourinary: Negative for urgency, hematuria, flank pain,  discharge, difficulty urinating and genital sores.  Musculoskeletal: Negative for myalgias, back pain, joint swelling, arthralgias, gait problem and neck stiffness.  Skin: Negative for rash.  Neurological: Positive for weakness. Negative for dizziness, syncope, speech difficulty, numbness and headaches.  Hematological: Negative for adenopathy. Does not bruise/bleed easily.  Psychiatric/Behavioral: Negative for behavioral problems and dysphoric mood. The patient is not nervous/anxious.  Objective:   Physical Exam  Constitutional: He is oriented to person, place, and time. He appears well-developed.  HENT:  Head: Normocephalic.  Right Ear: External ear normal.  Left Ear: External ear normal.  Eyes: Conjunctivae and EOM are normal.  Neck: Normal range of motion.  Cardiovascular: Normal rate and normal heart sounds.   Pulmonary/Chest: Effort normal. No respiratory distress. He has no wheezes.  Bibasilar rales, left greater than right  Abdominal: Bowel sounds are normal.  Musculoskeletal: Normal range of motion. He exhibits no edema or tenderness.  Neurological: He is alert and oriented to person, place, and time.  Psychiatric: He has a normal mood and affect. His behavior is normal.          Assessment & Plan:   History of community acquired pneumonia.  Will check a follow-up chest x-ray.  We'll attempt to obtain hospital records Diabetes Hypertension History of congestive heart failure.  Will review chest x-ray

## 2014-06-15 ENCOUNTER — Ambulatory Visit (INDEPENDENT_AMBULATORY_CARE_PROVIDER_SITE_OTHER)
Admission: RE | Admit: 2014-06-15 | Discharge: 2014-06-15 | Disposition: A | Discharge status: Home / Self Care | Payer: Medicare Other | Source: Ambulatory Visit | Attending: Internal Medicine | Admitting: Internal Medicine

## 2014-06-15 DIAGNOSIS — J189 Pneumonia, unspecified organism: Secondary | ICD-10-CM | POA: Diagnosis not present

## 2014-06-15 DIAGNOSIS — J449 Chronic obstructive pulmonary disease, unspecified: Secondary | ICD-10-CM | POA: Diagnosis not present

## 2014-06-17 ENCOUNTER — Telehealth: Payer: Self-pay | Admitting: Internal Medicine

## 2014-06-17 MED ORDER — BENZONATATE 100 MG PO CAPS: 100.0000 mg | ORAL_CAPSULE | Freq: Two times a day (BID) | ORAL | Status: DC | PRN

## 2014-06-17 NOTE — Telephone Encounter (Signed)
Okay to fill Rx for Tessalon Pearls for pt?

## 2014-06-17 NOTE — Telephone Encounter (Signed)
ok 

## 2014-06-17 NOTE — Telephone Encounter (Signed)
Pt was seen on 06-14-14 and would like rx for tessalon pearls call into [facility]

## 2014-06-17 NOTE — Telephone Encounter (Signed)
Pt notified Rx for Tessalon capsules sent to pharmacy.

## 2014-06-18 ENCOUNTER — Other Ambulatory Visit: Payer: Self-pay | Admitting: Internal Medicine

## 2014-07-11 ENCOUNTER — Other Ambulatory Visit: Payer: Self-pay | Admitting: Internal Medicine

## 2014-08-02 ENCOUNTER — Ambulatory Visit (INDEPENDENT_AMBULATORY_CARE_PROVIDER_SITE_OTHER): Payer: Medicare Other | Admitting: Internal Medicine

## 2014-08-02 ENCOUNTER — Encounter: Payer: Self-pay | Admitting: Internal Medicine

## 2014-08-02 DIAGNOSIS — E785 Hyperlipidemia, unspecified: Secondary | ICD-10-CM

## 2014-08-02 DIAGNOSIS — I10 Essential (primary) hypertension: Secondary | ICD-10-CM

## 2014-08-02 DIAGNOSIS — E1059 Type 1 diabetes mellitus with other circulatory complications: Secondary | ICD-10-CM

## 2014-08-02 DIAGNOSIS — Z23 Encounter for immunization: Secondary | ICD-10-CM | POA: Diagnosis not present

## 2014-08-02 DIAGNOSIS — I5022 Chronic systolic (congestive) heart failure: Secondary | ICD-10-CM | POA: Diagnosis not present

## 2014-08-02 LAB — HEMOGLOBIN A1C: HEMOGLOBIN A1C: 7.5 % — AB (ref 4.6–6.5)

## 2014-08-02 NOTE — Patient Instructions (Signed)
Limit your sodium (Salt) intake  You need to lose weight.  Consider a lower calorie diet and regular exercise.   Please check your hemoglobin A1c every 3 months   

## 2014-08-02 NOTE — Progress Notes (Signed)
Subjective:    Patient ID: [name], male    DOB: [birth year], 64 y.o.   MRN: [id number]  HPI  Wt Readings from Last 3 Encounters:  08/02/14 164 lb (74.39 kg)  06/14/14 161 lb (73.029 kg)  06/10/14 160 lb 6.4 oz (72.80 kg)   64 year old patient who is seen today in follow-up.  He has type 2 diabetes.  He has essential hypertension and history of cerebrovascular disease.  He was hospitalized in December for me acquired pneumonia.  In general doing fairly well. He has resumed his usual activities.  There has been some weight gain since his last visit  Lab Results  Component Value Date   HGBA1C 7.5* 08/02/2014    Past Medical History  Diagnosis Date  . 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 Hyperlipidemia   . CVA (cerebral vascular accident) 2005    hx of right brain subcortical stroke  . 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 Pneumonia Feb 2008  . Diabetes mellitus type II   . Arthritis     joints,   . H/O hiatal hernia   . Radiculopathy     S1  . Hypertension     Dr. Vidal Schwalbe cardiology  . Sinus congestion 06/19/11    pt has had sinus "infection" and just finished a z-pak  dr Anderson Malta aware    History   Social History  . Marital Status: Married    Spouse Name: N/A  . Number of Children: N/A  . Years of Education: N/A   Occupational History  . Not on file.   Social History Main Topics  . Smoking status: Former Smoker -- 30 years    Quit date: 06/03/2004  . Smokeless tobacco: Never Used  . Alcohol Use: No     Comment: rarely  . Drug Use: No  . Sexual Activity: Not on file   Other Topics Concern  . Not on file   Social History Narrative    Past Surgical History  Procedure Laterality Date  . Lymph node biospy  2000  . Colonoscopy  2003  . 2-d echocardiogram  April 2002    ejection fraction 55 to 65%  . Cataract extraction w/phaco  06/19/2011    Procedure: CATARACT  EXTRACTION PHACO AND INTRAOCULAR LENS PLACEMENT (IOC);  Surgeon: Adonis Brook, MD;  Location: [facility];  Service: Ophthalmology;  Laterality: Right;  [place] FMA50BM +18.00 D Right    Family History  Problem Relation Age of Onset  . Diabetes Mother   . Heart disease Mother   . Heart failure Father   . Anesthesia problems Neg Hx   . Cancer Sister   . Cancer Brother     Allergies  Allergen Reactions  . Codeine Phosphate     Itching and gittery    Current Outpatient Prescriptions on File Prior to Visit  Medication Sig Dispense Refill  . ACCU-CHEK FASTCLIX LANCETS MISC 1 each by Does not apply route daily as needed. 102 each 5  . amLODipine (NORVASC) 10 MG tablet TAKE 1 TABLET BY MOUTH ONCE DAILY 90 tablet 2  . aspirin 325 MG tablet Take 325 mg by mouth daily.      Marland Kitchen atorvastatin (LIPITOR) 10 MG tablet TAKE 1 TABLET BY MOUTH ONCE DAILY 90 tablet 3  . B-D UF III MINI PEN NEEDLES 31G X 5 MM MISC USE TWICE A DAY 100 each 5  .  cetirizine (ZYRTEC) 10 MG tablet Take 10 mg by mouth as needed for allergies.    . clotrimazole (LOTRIMIN) 1 % cream Apply topically 2 (two) times daily. 30 g 0  . FARXIGA 10 MG TABS tablet TAKE 1 TABLET EVERY DAY 90 tablet 1  . fexofenadine-pseudoephedrine (ALLEGRA-D) 60-120 MG per tablet Take 1 tablet by mouth daily as needed. For seasonal allergies    . fluticasone (FLONASE) 50 MCG/ACT nasal spray Place 2 sprays into both nostrils daily. 16 g 6  . fosinopril (MONOPRIL) 20 MG tablet TAKE 1 TABLET BY MOUTH TWICE DAILY 180 tablet 1  . glimepiride (AMARYL) 2 MG tablet Take 2 mg by mouth daily before breakfast.    . glucose blood (ACCU-CHEK AVIVA PLUS) test strip 1 each by Other route daily. Use as instructed 100 each 12  . KLOR-CON M20 20 MEQ tablet TAKE 1 TABLET BY MOUTH ONCE DAILY 90 tablet 3  . LORazepam (ATIVAN) 0.5 MG tablet TAKE 2 TABLETS BY MOUTH TWICE DAILY AS NEEDED 60 tablet 1  . metFORMIN (GLUCOPHAGE) 1000 MG tablet TAKE 1 TABLET BY MOUTH TWICE A DAY  180 tablet 2  . metoprolol (LOPRESSOR) 50 MG tablet TAKE 1 TABLET BY MOUTH ONCE DAILY 90 tablet 2  . Multiple Vitamins-Minerals (CENTRUM SILVER PO) Take 1 tablet by mouth daily.      . OMEGA 3 1200 MG CAPS Take 1,200 mg by mouth daily.    Vladimir Faster Glycol-Propyl Glycol (SYSTANE ULTRA OP) Place 1-2 drops into both eyes as needed.     . terazosin (HYTRIN) 5 MG capsule TAKE 1 CAPSULE BY MOUTH ONCE DAILY 90 capsule 3  . Triamcinolone Acetonide (NASACORT AQ NA) Place 2 sprays into the nose as needed.     . VENTOLIN HFA 108 (90 BASE) MCG/ACT inhaler INHALE 2 PUFFS EVERY 4 HOURS AS NEEDED FOR WHEEZING (NEEDS OFFICE VISIT) 18 each 1  . Dextromethorphan-Guaifenesin (MUCINEX DM MAXIMUM STRENGTH) 60-1200 MG TB12 Take 1 tablet by mouth 2 (two) times daily.    Marland Kitchen SPIRIVA HANDIHALER 18 MCG inhalation capsule Place 18 mcg into inhaler and inhale daily as needed.      No current facility-administered medications on file prior to visit.    BP 120/72 mmHg  Pulse 91  Temp(Src) 98 F (36.7 C) (Oral)  Resp 20  Ht 5\' 6"  (1.676 m)  Wt 164 lb (74.39 kg)  BMI 26.48 kg/m2  SpO2 95%     Review of Systems  Constitutional: Positive for unexpected weight change. Negative for fever, chills, appetite change and fatigue.  HENT: Negative for congestion, dental problem, ear pain, hearing loss, sore throat, tinnitus, trouble swallowing and voice change.   Eyes: Negative for pain, discharge and visual disturbance.  Respiratory: Negative for cough, chest tightness, wheezing and stridor.   Cardiovascular: Negative for chest pain, palpitations and leg swelling.  Gastrointestinal: Negative for nausea, vomiting, abdominal pain, diarrhea, constipation, blood in stool and abdominal distention.  Genitourinary: Negative for urgency, hematuria, flank pain, discharge, difficulty urinating and genital sores.  Musculoskeletal: Negative for myalgias, back pain, joint swelling, arthralgias, gait problem and neck stiffness.  Skin:  Negative for rash.  Neurological: Negative for dizziness, syncope, speech difficulty, weakness, numbness and headaches.  Hematological: Negative for adenopathy. Does not bruise/bleed easily.  Psychiatric/Behavioral: Negative for behavioral problems and dysphoric mood. The patient is not nervous/anxious.        Objective:   Physical Exam  Constitutional: He is oriented to person, place, and time. He appears well-developed.  HENT:  Head: Normocephalic.  Right Ear: External ear normal.  Left Ear: External ear normal.  Eyes: Conjunctivae and EOM are normal.  Neck: Normal range of motion.  Cardiovascular: Normal rate and normal heart sounds.   Pulmonary/Chest: He has rales.  Abdominal: Bowel sounds are normal.  Musculoskeletal: Normal range of motion. He exhibits no edema or tenderness.  Neurological: He is alert and oriented to person, place, and time.  Psychiatric: He has a normal mood and affect. His behavior is normal.          Assessment & Plan:  Diabetes mellitus.  Will check a hemoglobin A1c more exercise.  Modest weight loss.  All encouraged. Hypertension, well-controlled History pneumonia.  Stable

## 2014-08-02 NOTE — Progress Notes (Signed)
Pre visit review using our clinic review tool, if applicable. No additional management support is needed unless otherwise documented below in the visit note. 

## 2014-08-02 NOTE — Progress Notes (Signed)
   Subjective:    Patient ID: [name], male    DOB: 12-Jul-1950, 64 y.o.   MRN: [id number]  HPI  Lab Results  Component Value Date   HGBA1C 7.4* 02/16/2014    Review of Systems     Objective:   Physical Exam        Assessment & Plan:

## 2014-08-15 ENCOUNTER — Telehealth: Payer: Self-pay | Admitting: Internal Medicine

## 2014-08-15 ENCOUNTER — Other Ambulatory Visit: Payer: Self-pay | Admitting: Internal Medicine

## 2014-08-15 NOTE — Telephone Encounter (Signed)
Spoke to pt, told him it is up to him if he wants to switch to a One Touch meter, I can gladly send Rx for him. Pt stated he has a number to call the insurance and let them know if he wants one and they will send him one for free but there are different types of One Touch. Told pt One Touch Verio is similar to Accu-chek meter. Pt verbalized understanding. Told pt let me know if you need anything from me. Pt verbalized understanding.

## 2014-08-15 NOTE — Telephone Encounter (Signed)
Pt is attempting to get refill for test strips and lancets for his accu check, The pharm advise pt the one touch is cheaper.  Pt would like to know what meter for the one touch or would you even recommend switching to the new meter? pls advise

## 2014-08-23 ENCOUNTER — Ambulatory Visit (INDEPENDENT_AMBULATORY_CARE_PROVIDER_SITE_OTHER): Payer: Medicare Other | Admitting: Internal Medicine

## 2014-08-23 ENCOUNTER — Encounter: Payer: Self-pay | Admitting: Internal Medicine

## 2014-08-23 DIAGNOSIS — I10 Essential (primary) hypertension: Secondary | ICD-10-CM

## 2014-08-23 DIAGNOSIS — E785 Hyperlipidemia, unspecified: Secondary | ICD-10-CM

## 2014-08-23 DIAGNOSIS — Z23 Encounter for immunization: Secondary | ICD-10-CM

## 2014-08-23 DIAGNOSIS — Z8679 Personal history of other diseases of the circulatory system: Secondary | ICD-10-CM

## 2014-08-23 DIAGNOSIS — Z0000 Encounter for general adult medical examination without abnormal findings: Secondary | ICD-10-CM | POA: Diagnosis not present

## 2014-08-23 DIAGNOSIS — I5022 Chronic systolic (congestive) heart failure: Secondary | ICD-10-CM

## 2014-08-23 DIAGNOSIS — E1059 Type 1 diabetes mellitus with other circulatory complications: Secondary | ICD-10-CM

## 2014-08-23 LAB — MICROALBUMIN / CREATININE URINE RATIO
CREATININE, U: 85.3 mg/dL
MICROALB/CREAT RATIO: 79.6 mg/g — AB (ref 0.0–30.0)
Microalb, Ur: 67.9 mg/dL — ABNORMAL HIGH (ref 0.0–1.9)

## 2014-08-23 LAB — CBC WITH DIFFERENTIAL/PLATELET
BASOS PCT: 0.6 % (ref 0.0–3.0)
Basophils Absolute: 0 10*3/uL (ref 0.0–0.1)
Eosinophils Absolute: 0.2 10*3/uL (ref 0.0–0.7)
Eosinophils Relative: 3.6 % (ref 0.0–5.0)
HEMATOCRIT: 43.6 % (ref 39.0–52.0)
Hemoglobin: 14.3 g/dL (ref 13.0–17.0)
LYMPHS ABS: 1.3 10*3/uL (ref 0.7–4.0)
Lymphocytes Relative: 25.7 % (ref 12.0–46.0)
MCHC: 32.8 g/dL (ref 30.0–36.0)
MCV: 90.4 fl (ref 78.0–100.0)
MONOS PCT: 15.5 % — AB (ref 3.0–12.0)
Monocytes Absolute: 0.8 10*3/uL (ref 0.1–1.0)
Neutro Abs: 2.8 10*3/uL (ref 1.4–7.7)
Neutrophils Relative %: 54.6 % (ref 43.0–77.0)
PLATELETS: 337 10*3/uL (ref 150.0–400.0)
RBC: 4.82 Mil/uL (ref 4.22–5.81)
RDW: 13.9 % (ref 11.5–15.5)
WBC: 5.1 10*3/uL (ref 4.0–10.5)

## 2014-08-23 LAB — LIPID PANEL
CHOL/HDL RATIO: 4
Cholesterol: 120 mg/dL (ref 0–200)
HDL: 33.4 mg/dL — AB (ref >39.00)
LDL CALC: 63 mg/dL (ref 0–99)
NonHDL: 86.6
TRIGLYCERIDES: 120 mg/dL (ref 0.0–149.0)
VLDL: 24 mg/dL (ref 0.0–40.0)

## 2014-08-23 LAB — COMPREHENSIVE METABOLIC PANEL
ALBUMIN: 4.2 g/dL (ref 3.5–5.2)
ALK PHOS: 74 U/L (ref 39–117)
ALT: 18 U/L (ref 0–53)
AST: 19 U/L (ref 0–37)
BUN: 15 mg/dL (ref 6–23)
CALCIUM: 9.5 mg/dL (ref 8.4–10.5)
CO2: 30 mEq/L (ref 19–32)
Chloride: 104 mEq/L (ref 96–112)
Creatinine, Ser: 1.1 mg/dL (ref 0.40–1.50)
GFR: 86.07 mL/min (ref >60.00)
Glucose, Bld: 189 mg/dL — ABNORMAL HIGH (ref 70–99)
Potassium: 4.5 mEq/L (ref 3.5–5.1)
Sodium: 141 mEq/L (ref 135–145)
Total Bilirubin: 1.5 mg/dL — ABNORMAL HIGH (ref 0.2–1.2)
Total Protein: 7.5 g/dL (ref 6.0–8.3)

## 2014-08-23 LAB — TSH: TSH: 1.39 u[IU]/mL (ref 0.35–4.50)

## 2014-08-23 NOTE — Progress Notes (Signed)
Pre visit review using our clinic review tool, if applicable. No additional management support is needed unless otherwise documented below in the visit note. 

## 2014-08-23 NOTE — Progress Notes (Signed)
Subjective:    Patient ID: [name], male    DOB: 11/01/1950, 64 y.o.   MRN: [id number]  HPI 60 -year-old patient who is seen today for a preventive health examination.   He has been seen by oncology due  to a remote history of Hogkins disease.  He has type 2 diabetes.  His last hemoglobin A1c was not  well controlled, but he has been fairly inactive through the winter and also has been having some low back pain.  He has dyslipidemia, hypertension, and a history of congestive heart failure.  No cardiopulmonary complaints.  Past Medical History  Diagnosis Date  . 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 Hyperlipidemia   . CVA (cerebral vascular accident) 2005    hx of right brain subcortical stroke  . 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 Pneumonia Feb 2008  . Diabetes mellitus type II   . Arthritis     joints,   . H/O hiatal hernia   . Radiculopathy     S1  . Hypertension     Dr. Vidal Schwalbe cardiology  . Sinus congestion 06/19/11    pt has had sinus "infection" and just finished a z-pak  dr Anderson Malta aware    History   Social History  . Marital Status: Married    Spouse Name: N/A  . Number of Children: N/A  . Years of Education: N/A   Occupational History  . Not on file.   Social History Main Topics  . Smoking status: Former Smoker -- 30 years    Quit date: 06/03/2004  . Smokeless tobacco: Never Used  . Alcohol Use: No     Comment: rarely  . Drug Use: No  . Sexual Activity: Not on file   Other Topics Concern  . Not on file   Social History Narrative    Past Surgical History  Procedure Laterality Date  . Lymph node biospy  2000  . Colonoscopy  2003  . 2-d echocardiogram  April 2002    ejection fraction 55 to 65%  . Cataract extraction w/phaco  06/19/2011    Procedure: CATARACT EXTRACTION PHACO AND INTRAOCULAR LENS PLACEMENT (IOC);  Surgeon: Adonis Brook, MD;  Location: [place];   Service: Ophthalmology;  Laterality: Right;  [place] FMA50BM +18.00 D Right    Family History  Problem Relation Age of Onset  . Diabetes Mother   . Heart disease Mother   . Heart failure Father   . Anesthesia problems Neg Hx   . Cancer Sister   . Cancer Brother     Allergies  Allergen Reactions  . Codeine Phosphate     Itching and gittery    Current Outpatient Prescriptions on File Prior to Visit  Medication Sig Dispense Refill  . ACCU-CHEK FASTCLIX LANCETS MISC USE 1 LANCET DAILY AS DIRECTED 102 each 1  . amLODipine (NORVASC) 10 MG tablet TAKE 1 TABLET BY MOUTH ONCE DAILY 90 tablet 2  . aspirin 325 MG tablet Take 325 mg by mouth daily.      Marland Kitchen atorvastatin (LIPITOR) 10 MG tablet TAKE 1 TABLET BY MOUTH ONCE DAILY 90 tablet 3  . B-D UF III MINI PEN NEEDLES 31G X 5 MM MISC USE TWICE A DAY 100 each 5  . cetirizine (ZYRTEC) 10 MG tablet Take 10 mg by mouth as needed for allergies.    . clotrimazole (LOTRIMIN) 1 % cream  Apply topically 2 (two) times daily. 30 g 0  . Dextromethorphan-Guaifenesin (MUCINEX DM MAXIMUM STRENGTH) 60-1200 MG TB12 Take 1 tablet by mouth 2 (two) times daily.    Marland Kitchen FARXIGA 10 MG TABS tablet TAKE 1 TABLET EVERY DAY 90 tablet 1  . fexofenadine-pseudoephedrine (ALLEGRA-D) 60-120 MG per tablet Take 1 tablet by mouth daily as needed. For seasonal allergies    . fluticasone (FLONASE) 50 MCG/ACT nasal spray Place 2 sprays into both nostrils daily. 16 g 6  . fosinopril (MONOPRIL) 20 MG tablet TAKE 1 TABLET BY MOUTH TWICE DAILY 180 tablet 1  . glimepiride (AMARYL) 2 MG tablet Take 2 mg by mouth daily before breakfast.    . glucose blood (ACCU-CHEK AVIVA PLUS) test strip 1 each by Other route daily. Use as instructed 100 each 12  . KLOR-CON M20 20 MEQ tablet TAKE 1 TABLET BY MOUTH ONCE DAILY 90 tablet 3  . LORazepam (ATIVAN) 0.5 MG tablet TAKE 2 TABLETS BY MOUTH TWICE DAILY AS NEEDED 60 tablet 1  . metFORMIN (GLUCOPHAGE) 1000 MG tablet TAKE 1 TABLET BY MOUTH TWICE A  DAY 180 tablet 2  . metoprolol (LOPRESSOR) 50 MG tablet TAKE 1 TABLET BY MOUTH ONCE DAILY 90 tablet 2  . Multiple Vitamins-Minerals (CENTRUM SILVER PO) Take 1 tablet by mouth daily.      . OMEGA 3 1200 MG CAPS Take 1,200 mg by mouth daily.    Vladimir Faster Glycol-Propyl Glycol (SYSTANE ULTRA OP) Place 1-2 drops into both eyes as needed.     . terazosin (HYTRIN) 5 MG capsule TAKE 1 CAPSULE BY MOUTH ONCE DAILY 90 capsule 3  . Triamcinolone Acetonide (NASACORT AQ NA) Place 2 sprays into the nose as needed.     . VENTOLIN HFA 108 (90 BASE) MCG/ACT inhaler INHALE 2 PUFFS EVERY 4 HOURS AS NEEDED FOR WHEEZING (NEEDS OFFICE VISIT) 18 each 1   No current facility-administered medications on file prior to visit.    BP 150/90 mmHg  Pulse 91  Temp(Src) 97.9 F (36.6 C) (Oral)  Resp 20  Ht 5' 5.5" (1.664 m)  Wt 165 lb (74.844 kg)  BMI 27.03 kg/m2  SpO2 96%   1. Risk factors, based on past  M,S,F history-cardiovascular risk factors include diabetes, hypertension, and dyslipidemia.  He does have a history of congestive heart failure.  2.  Physical activities: No major restrictions, although has been slowed down.  More recently, due to low back pain  3.  Depression/mood: History depression or mood disorder  4.  Hearing: No significant deficits  5.  ADL's: Totally independent  6.  Fall risk: Low  7.  Home safety: No problems identified  8.  Height weight, and visual acuity; height and weight stable.  Recent eye examination one week ago.  No history of diabetic neuropathy  9.  Counseling: More regular exercise modest weight loss encouraged  10. Lab orders based on risk factors: Laboratory profile reviewed including lipid panel and urine for microalbumin  11. Referral : We'll continue annual eye examinations.  Patient had follow-up colonoscopy last year 13. Care plan: Heart healthy diet.  Encouraged.  Modest weight loss and exercise encouraged.  13. Cognitive assessment: Alert and alert with  normal affect.  No cognitive dysfunction  14.  Preventive services will include annual clinical exam was screening lab.  He will have his annual eye examinations performed. Patient was provided with a written and personalized care plan  15.  Provider list includes primary care ophthalmology and gastroenterology  Review of Systems  Constitutional: Negative for fever, chills, activity change, appetite change and fatigue.  HENT: Negative for congestion, dental problem, ear pain, hearing loss, mouth sores, rhinorrhea, sinus pressure, sneezing, tinnitus, trouble swallowing and voice change.   Eyes: Negative for photophobia, pain, redness and visual disturbance.  Respiratory: Negative for apnea, cough, choking, chest tightness, shortness of breath and wheezing.   Cardiovascular: Negative for chest pain, palpitations and leg swelling.  Gastrointestinal: Negative for nausea, vomiting, abdominal pain, diarrhea, constipation, blood in stool, abdominal distention, anal bleeding and rectal pain.  Genitourinary: Negative for dysuria, urgency, frequency, hematuria, flank pain, decreased urine volume, discharge, penile swelling, scrotal swelling, difficulty urinating, genital sores and testicular pain.  Musculoskeletal: Positive for back pain. Negative for myalgias, joint swelling, arthralgias, gait problem, neck pain and neck stiffness.  Skin: Negative for color change, rash and wound.  Neurological: Negative for dizziness, tremors, seizures, syncope, facial asymmetry, speech difficulty, weakness, light-headedness, numbness and headaches.  Hematological: Negative for adenopathy. Does not bruise/bleed easily.  Psychiatric/Behavioral: Negative for suicidal ideas, hallucinations, behavioral problems, confusion, sleep disturbance, self-injury, dysphoric mood, decreased concentration and agitation. The patient is not nervous/anxious.        Objective:   Physical Exam  Constitutional: He appears  well-developed and well-nourished.  HENT:  Head: Normocephalic and atraumatic.  Right Ear: External ear normal.  Left Ear: External ear normal.  Nose: Nose normal.  Mouth/Throat: Oropharynx is clear and moist.  Eyes: Conjunctivae and EOM are normal. Pupils are equal, round, and reactive to light. No scleral icterus.  Neck: Normal range of motion. Neck supple. No JVD present. No thyromegaly present.  Cardiovascular: Regular rhythm, normal heart sounds and intact distal pulses.  Exam reveals no gallop and no friction rub.   No murmur heard. Pulmonary/Chest: Effort normal and breath sounds normal. He exhibits no tenderness.  Abdominal: Soft. Bowel sounds are normal. He exhibits no distension and no mass. There is no tenderness.  Genitourinary: Penis normal. Guaiac negative stool.  Plus 2.  Enlargement  Musculoskeletal: Normal range of motion. He exhibits no edema or tenderness.  Lymphadenopathy:    He has no cervical adenopathy.  Neurological: He is alert. He has normal reflexes. No cranial nerve deficit. Coordination normal.  Skin: Skin is warm and dry. No rash noted.  Psychiatric: He has a normal mood and affect. His behavior is normal.          Assessment & Plan:   Preventive health examination Diabetes mellitus.  We'll continue present regimen.  We'll increase level of exercise and attempt at a modest weight loss.  Recheck hemoglobin A1c in 3 months Hypertension well controlled History congestive heart failure, stable Dyslipidemia.  Continue statin therapy Remote history of Hodgkin's lymphoma.  Remains in remission

## 2014-08-23 NOTE — Patient Instructions (Signed)
Limit your sodium (Salt) intake   Please check your hemoglobin A1c every 3 months    It is important that you exercise regularly, at least 20 minutes 3 to 4 times per week.  If you develop chest pain or shortness of breath seek  medical attention.  You need to lose weight.  Consider a lower calorie diet and regular exercise.  Health Maintenance A healthy lifestyle and preventative care can promote health and wellness.  Maintain regular health, dental, and eye exams.  Eat a healthy diet. Foods like vegetables, fruits, whole grains, low-fat dairy products, and lean protein foods contain the nutrients you need and are low in calories. Decrease your intake of foods high in solid fats, added sugars, and salt. Get information about a proper diet from your health care provider, if necessary.  Regular physical exercise is one of the most important things you can do for your health. Most adults should get at least 150 minutes of moderate-intensity exercise (any activity that increases your heart rate and causes you to sweat) each week. In addition, most adults need muscle-strengthening exercises on 2 or more days a week.   Maintain a healthy weight. The body mass index (BMI) is a screening tool to identify possible weight problems. It provides an estimate of body fat based on height and weight. Your health care provider can find your BMI and can help you achieve or maintain a healthy weight. For males 20 years and older:  A BMI below 18.5 is considered underweight.  A BMI of 18.5 to 24.9 is normal.  A BMI of 25 to 29.9 is considered overweight.  A BMI of 30 and above is considered obese.  Maintain normal blood lipids and cholesterol by exercising and minimizing your intake of saturated fat. Eat a balanced diet with plenty of fruits and vegetables. Blood tests for lipids and cholesterol should begin at age 5 and be repeated every 5 years. If your lipid or cholesterol levels are high, you are over  age 43, or you are at high risk for heart disease, you may need your cholesterol levels checked more frequently.Ongoing high lipid and cholesterol levels should be treated with medicines if diet and exercise are not working.  If you smoke, find out from your health care provider how to quit. If you do not use tobacco, do not start.  Lung cancer screening is recommended for adults aged 71-80 years who are at high risk for developing lung cancer because of a history of smoking. A yearly low-dose CT scan of the lungs is recommended for people who have at least a 30-pack-year history of smoking and are current smokers or have quit within the past 15 years. A pack year of smoking is smoking an average of 1 pack of cigarettes a day for 1 year (for example, a 30-pack-year history of smoking could mean smoking 1 pack a day for 30 years or 2 packs a day for 15 years). Yearly screening should continue until the smoker has stopped smoking for at least 15 years. Yearly screening should be stopped for people who develop a health problem that would prevent them from having lung cancer treatment.  If you choose to drink alcohol, do not have more than 2 drinks per day. One drink is considered to be 12 oz (360 mL) of beer, 5 oz (150 mL) of wine, or 1.5 oz (45 mL) of liquor.  Avoid the use of street drugs. Do not share needles with anyone. Ask for help  if you need support or instructions about stopping the use of drugs.  High blood pressure causes heart disease and increases the risk of stroke. Blood pressure should be checked at least every 1-2 years. Ongoing high blood pressure should be treated with medicines if weight loss and exercise are not effective.  If you are 24-47 years old, ask your health care provider if you should take aspirin to prevent heart disease.  Diabetes screening involves taking a blood sample to check your fasting blood sugar level. This should be done once every 3 years after age 56 if you  are at a normal weight and without risk factors for diabetes. Testing should be considered at a younger age or be carried out more frequently if you are overweight and have at least 1 risk factor for diabetes.  Colorectal cancer can be detected and often prevented. Most routine colorectal cancer screening begins at the age of 63 and continues through age 96. However, your health care provider may recommend screening at an earlier age if you have risk factors for colon cancer. On a yearly basis, your health care provider may provide home test kits to check for hidden blood in the stool. A small camera at the end of a tube may be used to directly examine the colon (sigmoidoscopy or colonoscopy) to detect the earliest forms of colorectal cancer. Talk to your health care provider about this at age 54 when routine screening begins. A direct exam of the colon should be repeated every 5-10 years through age 26, unless early forms of precancerous polyps or small growths are found.  People who are at an increased risk for hepatitis B should be screened for this virus. You are considered at high risk for hepatitis B if:  You were born in a country where hepatitis B occurs often. Talk with your health care provider about which countries are considered high risk.  Your parents were born in a high-risk country and you have not received a shot to protect against hepatitis B (hepatitis B vaccine).  You have HIV or AIDS.  You use needles to inject street drugs.  You live with, or have sex with, someone who has hepatitis B.  You are a man who has sex with other men (MSM).  You get hemodialysis treatment.  You take certain medicines for conditions like cancer, organ transplantation, and autoimmune conditions.  Hepatitis C blood testing is recommended for all people born from [birth year] through 1967 and any individual with known risk factors for hepatitis C.  Healthy men should no longer receive prostate-specific  antigen (PSA) blood tests as part of routine cancer screening. Talk to your health care provider about prostate cancer screening.  Testicular cancer screening is not recommended for adolescents or adult males who have no symptoms. Screening includes self-exam, a health care provider exam, and other screening tests. Consult with your health care provider about any symptoms you have or any concerns you have about testicular cancer.  Practice safe sex. Use condoms and avoid high-risk sexual practices to reduce the spread of sexually transmitted infections (STIs).  You should be screened for STIs, including gonorrhea and chlamydia if:  You are sexually active and are younger than 24 years.  You are older than 24 years, and your health care provider tells you that you are at risk for this type of infection.  Your sexual activity has changed since you were last screened, and you are at an increased risk for chlamydia or gonorrhea. Ask  your health care provider if you are at risk.  If you are at risk of being infected with HIV, it is recommended that you take a prescription medicine daily to prevent HIV infection. This is called pre-exposure prophylaxis (PrEP). You are considered at risk if:  You are a man who has sex with other men (MSM).  You are a heterosexual man who is sexually active with multiple partners.  You take drugs by injection.  You are sexually active with a partner who has HIV.  Talk with your health care provider about whether you are at high risk of being infected with HIV. If you choose to begin PrEP, you should first be tested for HIV. You should then be tested every 3 months for as long as you are taking PrEP.  Use sunscreen. Apply sunscreen liberally and repeatedly throughout the day. You should seek shade when your shadow is shorter than you. Protect yourself by wearing long sleeves, pants, a wide-brimmed hat, and sunglasses year round whenever you are outdoors.  Tell  your health care provider of new moles or changes in moles, especially if there is a change in shape or color. Also, tell your health care provider if a mole is larger than the size of a pencil eraser.  A one-time screening for abdominal aortic aneurysm (AAA) and surgical repair of large AAAs by ultrasound is recommended for men aged 39-75 years who are current or former smokers.  Stay current with your vaccines (immunizations). Document Released: 11/16/2007 Document Revised: 05/25/2013 Document Reviewed: 10/15/2010 [facility] Patient Information 2015 [place], [place]. This information is not intended to replace advice given to you by your health care provider. Make sure you discuss any questions you have with your health care provider.

## 2014-09-03 ENCOUNTER — Other Ambulatory Visit: Payer: Self-pay | Admitting: Internal Medicine

## 2014-09-14 ENCOUNTER — Other Ambulatory Visit: Payer: Self-pay | Admitting: Internal Medicine

## 2014-09-29 ENCOUNTER — Other Ambulatory Visit: Payer: Self-pay | Admitting: Internal Medicine

## 2014-10-03 ENCOUNTER — Other Ambulatory Visit: Payer: Self-pay | Admitting: Internal Medicine

## 2014-10-06 ENCOUNTER — Other Ambulatory Visit: Payer: Self-pay | Admitting: Internal Medicine

## 2014-10-06 ENCOUNTER — Encounter: Payer: Self-pay | Admitting: Gastroenterology

## 2014-10-19 DIAGNOSIS — H04123 Dry eye syndrome of bilateral lacrimal glands: Secondary | ICD-10-CM | POA: Diagnosis not present

## 2014-10-19 DIAGNOSIS — H02412 Mechanical ptosis of left eyelid: Secondary | ICD-10-CM | POA: Diagnosis not present

## 2014-10-19 LAB — HM DIABETES EYE EXAM

## 2014-10-21 ENCOUNTER — Encounter: Payer: Self-pay | Admitting: Internal Medicine

## 2014-10-25 ENCOUNTER — Other Ambulatory Visit: Payer: Self-pay | Admitting: Internal Medicine

## 2014-11-04 ENCOUNTER — Other Ambulatory Visit: Payer: Self-pay | Admitting: Internal Medicine

## 2014-11-15 ENCOUNTER — Other Ambulatory Visit: Payer: Self-pay | Admitting: Internal Medicine

## 2014-11-22 ENCOUNTER — Other Ambulatory Visit: Payer: Self-pay | Admitting: Internal Medicine

## 2014-11-23 ENCOUNTER — Encounter: Payer: Self-pay | Admitting: Internal Medicine

## 2014-11-23 ENCOUNTER — Ambulatory Visit (INDEPENDENT_AMBULATORY_CARE_PROVIDER_SITE_OTHER): Payer: Medicare Other | Admitting: Internal Medicine

## 2014-11-23 VITALS — BP 130/70 | HR 79 | Temp 98.3°F | Resp 20 | Ht 65.5 in | Wt 166.0 lb

## 2014-11-23 DIAGNOSIS — E785 Hyperlipidemia, unspecified: Secondary | ICD-10-CM | POA: Diagnosis not present

## 2014-11-23 DIAGNOSIS — I10 Essential (primary) hypertension: Secondary | ICD-10-CM

## 2014-11-23 DIAGNOSIS — E0959 Drug or chemical induced diabetes mellitus with other circulatory complications: Secondary | ICD-10-CM | POA: Diagnosis not present

## 2014-11-23 LAB — TSH: TSH: 1.41 u[IU]/mL (ref 0.35–4.50)

## 2014-11-23 LAB — HEMOGLOBIN A1C: Hgb A1c MFr Bld: 7.7 % — ABNORMAL HIGH (ref 4.6–6.5)

## 2014-11-23 MED ORDER — LOSARTAN POTASSIUM 100 MG PO TABS: 100.0000 mg | ORAL_TABLET | Freq: Every day | ORAL | Status: DC

## 2014-11-23 NOTE — Progress Notes (Signed)
Pre visit review using our clinic review tool, if applicable. No additional management support is needed unless otherwise documented below in the visit note. 

## 2014-11-23 NOTE — Progress Notes (Signed)
Subjective:    Patient ID: [name], male    DOB: Apr 24, 1951, 64 y.o.   MRN: [id number]  HPI  Lab Results  Component Value Date   HGBA1C 7.5* 08/02/2014   Wt Readings from Last 3 Encounters:  11/23/14 166 lb (75.297 kg)  08/23/14 165 lb (74.844 kg)  08/02/14 164 lb (74.63 kg)   64 year old patient who has type 2 diabetes.  Last hemoglobin A1c was 7.5 and glimepiride has been increased to 4 mg daily.  No hypoglycemia.  He has enjoyed improved glycemic control Complaints today include cough, postnasal drip, as well as cold intolerance He has treated hypertension which has been stable. He does have a history of allergic rhinitis and has been on Zyrtec and Flonase.  He feels the Zyrtec may be causing some drowsiness.  Past Medical History  Diagnosis Date  . 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 Hyperlipidemia   . CVA (cerebral vascular accident) 2005    hx of right brain subcortical stroke  . 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 Pneumonia Feb 2008  . Diabetes mellitus type II   . Arthritis     joints,   . H/O hiatal hernia   . Radiculopathy     S1  . Hypertension     Dr. Vidal Schwalbe cardiology  . Sinus congestion 06/19/11    pt has had sinus "infection" and just finished a z-pak  dr Anderson Malta aware    History   Social History  . Marital Status: Married    Spouse Name: N/A  . Number of Children: N/A  . Years of Education: N/A   Occupational History  . Not on file.   Social History Main Topics  . Smoking status: Former Smoker -- 30 years    Quit date: 06/03/2004  . Smokeless tobacco: Never Used  . Alcohol Use: No     Comment: rarely  . Drug Use: No  . Sexual Activity: Not on file   Other Topics Concern  . Not on file   Social History Narrative    Past Surgical History  Procedure Laterality Date  . Lymph node biospy  2000  . Colonoscopy  2003  . 2-d echocardiogram  April  2002    ejection fraction 55 to 65%  . Cataract extraction w/phaco  06/19/2011    Procedure: CATARACT EXTRACTION PHACO AND INTRAOCULAR LENS PLACEMENT (IOC);  Surgeon: Adonis Brook, MD;  Location: [facility];  Service: Ophthalmology;  Laterality: Right;  [place] FMA50BM +18.00 D Right    Family History  Problem Relation Age of Onset  . Diabetes Mother   . Heart disease Mother   . Heart failure Father   . Anesthesia problems Neg Hx   . Cancer Sister   . Cancer Brother     Allergies  Allergen Reactions  . Codeine Phosphate     Itching and gittery    Current Outpatient Prescriptions on File Prior to Visit  Medication Sig Dispense Refill  . ACCU-CHEK FASTCLIX LANCETS MISC USE 1 LANCET DAILY AS DIRECTED 102 each 1  . amLODipine (NORVASC) 10 MG tablet TAKE 1 TABLET BY MOUTH ONCE DAILY 90 tablet 1  . aspirin 325 MG tablet Take 325 mg by mouth daily.      Marland Kitchen atorvastatin (LIPITOR) 10 MG tablet TAKE 1 TABLET BY MOUTH ONCE DAILY 90 tablet 1  . B-D UF III MINI PEN NEEDLES  31G X 5 MM MISC USE TWICE A DAY 100 each 5  . benzonatate (TESSALON) 100 MG capsule TAKE ONE CAPSULE TWICE A DAY AS NEEDED 30 capsule 0  . cetirizine (ZYRTEC) 10 MG tablet Take 10 mg by mouth as needed for allergies.    . clotrimazole (LOTRIMIN) 1 % cream Apply topically 2 (two) times daily. 30 g 0  . Dextromethorphan-Guaifenesin (MUCINEX DM MAXIMUM STRENGTH) 60-1200 MG TB12 Take 1 tablet by mouth 2 (two) times daily.    Marland Kitchen FARXIGA 10 MG TABS tablet TAKE 1 TABLET EVERY DAY 90 tablet 1  . fexofenadine-pseudoephedrine (ALLEGRA-D) 60-120 MG per tablet Take 1 tablet by mouth daily as needed. For seasonal allergies    . fluticasone (FLONASE) 50 MCG/ACT nasal spray Place 2 sprays into both nostrils daily. 16 g 6  . fosinopril (MONOPRIL) 20 MG tablet TAKE 1 TABLET BY MOUTH TWICE DAILY 180 tablet 1  . glimepiride (AMARYL) 4 MG tablet TAKE 1 TABLET BY MOUTH TWICE A DAY 180 tablet 1  . glucose blood (ACCU-CHEK AVIVA PLUS) test strip 1  each by Other route daily. Use as instructed 100 each 12  . KLOR-CON M20 20 MEQ tablet TAKE 1 TABLET BY MOUTH ONCE DAILY 90 tablet 3  . LORazepam (ATIVAN) 0.5 MG tablet TAKE 2 TABLETS BY MOUTH TWICE DAILY AS NEEDED 60 tablet 1  . metFORMIN (GLUCOPHAGE) 1000 MG tablet TAKE 1 TABLET BY MOUTH TWICE A DAY 180 tablet 2  . metoprolol (LOPRESSOR) 50 MG tablet TAKE 1 TABLET BY MOUTH ONCE DAILY 90 tablet 1  . Multiple Vitamins-Minerals (CENTRUM SILVER PO) Take 1 tablet by mouth daily.      . OMEGA 3 1200 MG CAPS Take 1,200 mg by mouth daily.    Vladimir Faster Glycol-Propyl Glycol (SYSTANE ULTRA OP) Place 1-2 drops into both eyes as needed.     . terazosin (HYTRIN) 5 MG capsule TAKE 1 CAPSULE BY MOUTH ONCE DAILY 90 capsule 3  . Triamcinolone Acetonide (NASACORT AQ NA) Place 2 sprays into the nose as needed.     . VENTOLIN HFA 108 (90 BASE) MCG/ACT inhaler INHALE 2 PUFFS EVERY 4 HOURS AS NEEDED FOR WHEEZING. *NEEDS OFFICE VISIT* 18 Inhaler 1   No current facility-administered medications on file prior to visit.    BP 130/70 mmHg  Pulse 79  Temp(Src) 98.3 F (36.8 C) (Oral)  Resp 20  Ht 5' 5.5" (1.664 m)  Wt 166 lb (75.297 kg)  BMI 27.19 kg/m2  SpO2 94%       Review of Systems  Constitutional: Negative for fever, chills, appetite change and fatigue.  HENT: Positive for congestion, postnasal drip and rhinorrhea. Negative for dental problem, ear pain, hearing loss, sore throat, tinnitus, trouble swallowing and voice change.   Eyes: Negative for pain, discharge and visual disturbance.  Respiratory: Positive for cough. Negative for chest tightness, wheezing and stridor.   Cardiovascular: Negative for chest pain, palpitations and leg swelling.  Gastrointestinal: Negative for nausea, vomiting, abdominal pain, diarrhea, constipation, blood in stool and abdominal distention.  Endocrine: Positive for cold intolerance.  Genitourinary: Negative for urgency, hematuria, flank pain, discharge, difficulty  urinating and genital sores.  Musculoskeletal: Negative for myalgias, back pain, joint swelling, arthralgias, gait problem and neck stiffness.  Skin: Negative for rash.  Neurological: Negative for dizziness, syncope, speech difficulty, weakness, numbness and headaches.  Hematological: Negative for adenopathy. Does not bruise/bleed easily.  Psychiatric/Behavioral: Negative for behavioral problems and dysphoric mood. The patient is not nervous/anxious.  Objective:   Physical Exam  Constitutional: He is oriented to person, place, and time. He appears well-developed.  HENT:  Head: Normocephalic.  Right Ear: External ear normal.  Left Ear: External ear normal.  Eyes: Conjunctivae and EOM are normal.  Neck: Normal range of motion.  Cardiovascular: Normal rate and normal heart sounds.   Pulmonary/Chest: He has rales.  Abdominal: Bowel sounds are normal.  Musculoskeletal: Normal range of motion. He exhibits no edema or tenderness.  Neurological: He is alert and oriented to person, place, and time.  Psychiatric: He has a normal mood and affect. His behavior is normal.          Assessment & Plan:   Diabetes mellitus.  Will check a hemoglobin A1c.  If not at goal, will need additional medication Hypertension, stable Allergic rhinitis.  Patient does have frequent cough.  Will discontinue ACE inhibitor and substitute losartan History congestive heart failure  Recheck 3 months

## 2014-11-23 NOTE — Patient Instructions (Signed)
Substitute losartan for Monopril Discontinue Zyrtec and substitute Claritin or Alavert   Please check your hemoglobin A1c every 3 months

## 2014-11-24 DIAGNOSIS — Z961 Presence of intraocular lens: Secondary | ICD-10-CM | POA: Diagnosis not present

## 2014-11-24 DIAGNOSIS — H02402 Unspecified ptosis of left eyelid: Secondary | ICD-10-CM | POA: Diagnosis not present

## 2014-11-24 DIAGNOSIS — H5310 Unspecified subjective visual disturbances: Secondary | ICD-10-CM | POA: Diagnosis not present

## 2014-12-01 ENCOUNTER — Encounter: Payer: Self-pay | Admitting: Internal Medicine

## 2014-12-01 ENCOUNTER — Ambulatory Visit (INDEPENDENT_AMBULATORY_CARE_PROVIDER_SITE_OTHER): Payer: Medicare Other | Admitting: Internal Medicine

## 2014-12-01 VITALS — BP 136/70 | HR 89 | Temp 98.2°F | Resp 20 | Ht 65.5 in | Wt 165.0 lb

## 2014-12-01 DIAGNOSIS — G4733 Obstructive sleep apnea (adult) (pediatric): Secondary | ICD-10-CM | POA: Diagnosis not present

## 2014-12-01 DIAGNOSIS — E0959 Drug or chemical induced diabetes mellitus with other circulatory complications: Secondary | ICD-10-CM | POA: Diagnosis not present

## 2014-12-01 MED ORDER — GLIMEPIRIDE 4 MG PO TABS: 4.0000 mg | ORAL_TABLET | Freq: Every day | ORAL | Status: DC

## 2014-12-01 MED ORDER — DULAGLUTIDE 1.5 MG/0.5ML ~~LOC~~ SOAJ: 1.5000 mg | SUBCUTANEOUS | Status: DC

## 2014-12-01 NOTE — Progress Notes (Signed)
Pre visit review using our clinic review tool, if applicable. No additional management support is needed unless otherwise documented below in the visit note. 

## 2014-12-01 NOTE — Patient Instructions (Signed)
Please check your hemoglobin A1c every 3 months

## 2014-12-01 NOTE — Progress Notes (Signed)
Subjective:    Patient ID: [name], male    DOB: [birth year], 64 y.o.   MRN: [id number]  HPI 64 year old patient who is seen today for follow-up of type 2 diabetes.  Most recent hemoglobin A1c has increased to 7.7.  Fasting blood sugars are generally fairly well controlled and often less than 100.  He has been on GLP 1 agonist in the past but apparently these were discontinued due to suboptimal results.  Complains of worsening daytime sleepiness.  No history of loud snoring, or disruptive sleep.  He occasionally has a difficult time falling asleep  Past Medical History  Diagnosis Date  . 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 Hyperlipidemia   . CVA (cerebral vascular accident) 2005    hx of right brain subcortical stroke  . 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 Pneumonia Feb 2008  . Diabetes mellitus type II   . Arthritis     joints,   . H/O hiatal hernia   . Radiculopathy     S1  . Hypertension     Dr. Vidal Schwalbe cardiology  . Sinus congestion 06/19/11    pt has had sinus "infection" and just finished a z-pak  dr Anderson Malta aware    History   Social History  . Marital Status: Married    Spouse Name: N/A  . Number of Children: N/A  . Years of Education: N/A   Occupational History  . Not on file.   Social History Main Topics  . Smoking status: Former Smoker -- 30 years    Quit date: 06/03/2004  . Smokeless tobacco: Never Used  . Alcohol Use: No     Comment: rarely  . Drug Use: No  . Sexual Activity: Not on file   Other Topics Concern  . Not on file   Social History Narrative    Past Surgical History  Procedure Laterality Date  . Lymph node biospy  2000  . Colonoscopy  2003  . 2-d echocardiogram  April 2002    ejection fraction 55 to 65%  . Cataract extraction w/phaco  06/19/2011    Procedure: CATARACT EXTRACTION PHACO AND INTRAOCULAR LENS PLACEMENT (IOC);  Surgeon: Adonis Brook, MD;  Location: [facility];  Service: Ophthalmology;  Laterality: Right;  [place] FMA50BM +18.00 D Right    Family History  Problem Relation Age of Onset  . Diabetes Mother   . Heart disease Mother   . Heart failure Father   . Anesthesia problems Neg Hx   . Cancer Sister   . Cancer Brother     Allergies  Allergen Reactions  . Codeine Phosphate     Itching and gittery    Current Outpatient Prescriptions on File Prior to Visit  Medication Sig Dispense Refill  . ACCU-CHEK FASTCLIX LANCETS MISC USE 1 LANCET DAILY AS DIRECTED 102 each 1  . amLODipine (NORVASC) 10 MG tablet TAKE 1 TABLET BY MOUTH ONCE DAILY 90 tablet 1  . aspirin 325 MG tablet Take 325 mg by mouth daily.      Marland Kitchen atorvastatin (LIPITOR) 10 MG tablet TAKE 1 TABLET BY MOUTH ONCE DAILY 90 tablet 1  . B-D UF III MINI PEN NEEDLES 31G X 5 MM MISC USE TWICE A DAY 100 each 5  . benzonatate (TESSALON) 100 MG capsule TAKE ONE CAPSULE TWICE A DAY AS NEEDED 30 capsule 0  . clotrimazole (LOTRIMIN) 1 % cream  Apply topically 2 (two) times daily. 30 g 0  . Dextromethorphan-Guaifenesin (MUCINEX DM MAXIMUM STRENGTH) 60-1200 MG TB12 Take 1 tablet by mouth 2 (two) times daily.    Marland Kitchen FARXIGA 10 MG TABS tablet TAKE 1 TABLET EVERY DAY 90 tablet 1  . fexofenadine-pseudoephedrine (ALLEGRA-D) 60-120 MG per tablet Take 1 tablet by mouth daily as needed. For seasonal allergies    . fluticasone (FLONASE) 50 MCG/ACT nasal spray Place 2 sprays into both nostrils daily. 16 g 6  . glimepiride (AMARYL) 4 MG tablet TAKE 1 TABLET BY MOUTH TWICE A DAY 180 tablet 1  . glucose blood (ACCU-CHEK AVIVA PLUS) test strip 1 each by Other route daily. Use as instructed 100 each 12  . KLOR-CON M20 20 MEQ tablet TAKE 1 TABLET BY MOUTH ONCE DAILY 90 tablet 3  . LORazepam (ATIVAN) 0.5 MG tablet TAKE 2 TABLETS BY MOUTH TWICE DAILY AS NEEDED 60 tablet 1  . losartan (COZAAR) 100 MG tablet Take 1 tablet (100 mg total) by mouth daily. 90 tablet 3  . metFORMIN  (GLUCOPHAGE) 1000 MG tablet TAKE 1 TABLET BY MOUTH TWICE A DAY 180 tablet 2  . metoprolol (LOPRESSOR) 50 MG tablet TAKE 1 TABLET BY MOUTH ONCE DAILY 90 tablet 1  . Multiple Vitamins-Minerals (CENTRUM SILVER PO) Take 1 tablet by mouth daily.      . OMEGA 3 1200 MG CAPS Take 1,200 mg by mouth daily.    Vladimir Faster Glycol-Propyl Glycol (SYSTANE ULTRA OP) Place 1-2 drops into both eyes as needed.     . terazosin (HYTRIN) 5 MG capsule TAKE 1 CAPSULE BY MOUTH ONCE DAILY 90 capsule 3  . Triamcinolone Acetonide (NASACORT AQ NA) Place 2 sprays into the nose as needed.     . VENTOLIN HFA 108 (90 BASE) MCG/ACT inhaler INHALE 2 PUFFS EVERY 4 HOURS AS NEEDED FOR WHEEZING. *NEEDS OFFICE VISIT* 18 Inhaler 1   No current facility-administered medications on file prior to visit.    BP 136/70 mmHg  Pulse 89  Temp(Src) 98.2 F (36.8 C) (Oral)  Resp 20  Ht 5' 5.5" (1.664 m)  Wt 165 lb (74.844 kg)  BMI 27.03 kg/m2  SpO2 95%      Review of Systems  Constitutional: Negative for fever, chills, appetite change and fatigue.  HENT: Negative for congestion, dental problem, ear pain, hearing loss, sore throat, tinnitus, trouble swallowing and voice change.   Eyes: Negative for pain, discharge and visual disturbance.  Respiratory: Negative for cough, chest tightness, wheezing and stridor.   Cardiovascular: Negative for chest pain, palpitations and leg swelling.  Gastrointestinal: Negative for nausea, vomiting, abdominal pain, diarrhea, constipation, blood in stool and abdominal distention.  Genitourinary: Negative for urgency, hematuria, flank pain, discharge, difficulty urinating and genital sores.  Musculoskeletal: Negative for myalgias, back pain, joint swelling, arthralgias, gait problem and neck stiffness.  Skin: Negative for rash.  Neurological: Negative for dizziness, syncope, speech difficulty, weakness, numbness and headaches.  Hematological: Negative for adenopathy. Does not bruise/bleed easily.    Psychiatric/Behavioral: Negative for behavioral problems and dysphoric mood. The patient is not nervous/anxious.        Objective:   Physical Exam  Constitutional: He appears well-developed and well-nourished. No distress.  HENT:  Pharyngeal crowding with low hanging soft palate          Assessment & Plan:

## 2014-12-02 ENCOUNTER — Other Ambulatory Visit: Payer: Self-pay | Admitting: Internal Medicine

## 2014-12-02 ENCOUNTER — Telehealth: Payer: Self-pay | Admitting: Internal Medicine

## 2014-12-02 NOTE — Telephone Encounter (Signed)
Gargle with warm tap water or use a mouthwash as necessary Lozengers  for sore throat if needed  Call if patient develops fever

## 2014-12-02 NOTE — Telephone Encounter (Signed)
Patient is aware 

## 2014-12-02 NOTE — Telephone Encounter (Signed)
Pt is having a bit of a sore thoat today. Pt states dr Raliegh Ip saw something "growing" in the back of his throat.would like   To know if dr Raliegh Ip will call in  rx or if he needs anything                                                                      [facility]

## 2014-12-15 ENCOUNTER — Other Ambulatory Visit: Payer: Self-pay | Admitting: Internal Medicine

## 2015-01-11 DIAGNOSIS — H5310 Unspecified subjective visual disturbances: Secondary | ICD-10-CM | POA: Diagnosis not present

## 2015-01-11 DIAGNOSIS — H02402 Unspecified ptosis of left eyelid: Secondary | ICD-10-CM | POA: Diagnosis not present

## 2015-01-11 DIAGNOSIS — Z961 Presence of intraocular lens: Secondary | ICD-10-CM | POA: Diagnosis not present

## 2015-02-23 ENCOUNTER — Encounter: Payer: Self-pay | Admitting: Internal Medicine

## 2015-02-23 ENCOUNTER — Ambulatory Visit (INDEPENDENT_AMBULATORY_CARE_PROVIDER_SITE_OTHER): Payer: Medicare Other | Admitting: Internal Medicine

## 2015-02-23 VITALS — BP 126/80 | HR 91 | Temp 98.0°F | Resp 20 | Ht 65.5 in | Wt 168.0 lb

## 2015-02-23 DIAGNOSIS — Z23 Encounter for immunization: Secondary | ICD-10-CM | POA: Diagnosis not present

## 2015-02-23 DIAGNOSIS — E785 Hyperlipidemia, unspecified: Secondary | ICD-10-CM | POA: Diagnosis not present

## 2015-02-23 DIAGNOSIS — E1359 Other specified diabetes mellitus with other circulatory complications: Secondary | ICD-10-CM | POA: Diagnosis not present

## 2015-02-23 DIAGNOSIS — I10 Essential (primary) hypertension: Secondary | ICD-10-CM | POA: Diagnosis not present

## 2015-02-23 LAB — HEMOGLOBIN A1C: HEMOGLOBIN A1C: 7.4 % — AB (ref 4.6–6.5)

## 2015-02-23 NOTE — Progress Notes (Signed)
Pre visit review using our clinic review tool, if applicable. No additional management support is needed unless otherwise documented below in the visit note. 

## 2015-02-23 NOTE — Patient Instructions (Signed)
Please check your hemoglobin A1c every 3 months  Limit your sodium (Salt) intake    It is important that you exercise regularly, at least 20 minutes 3 to 4 times per week.  If you develop chest pain or shortness of breath seek  medical attention.  You need to lose weight.  Consider a lower calorie diet and regular exercise. 

## 2015-02-23 NOTE — Progress Notes (Signed)
Subjective:    Patient ID: [name], male    DOB: February 05, 1951, 64 y.o.   MRN: [id number]  HPI  Wt Readings from Last 3 Encounters:  02/23/15 168 lb (76.204 kg)  12/01/14 165 lb (74.844 kg)  11/23/14 166 lb (75.297 kg)    Lab Results  Component Value Date   HGBA1C 7.7* 11/23/2014   64 year old patient who is seen today for follow-up of diabetes.  He has essential hypertension and dyslipidemia.  He has done well over the past 3 months but unfortunately, there is been no weight loss in spite of adding GLP 1 agonist.  3 months ago.  He does state his appetite has decreased.  He feels that he is eating less. No cardiopulmonary complaints  Past Medical History  Diagnosis Date  . 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 Hyperlipidemia   . CVA (cerebral vascular accident) 2005    hx of right brain subcortical stroke  . 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 Pneumonia Feb 2008  . Diabetes mellitus type II   . Arthritis     joints,   . H/O hiatal hernia   . Radiculopathy     S1  . Hypertension     Dr. Vidal Schwalbe cardiology  . Sinus congestion 06/19/11    pt has had sinus "infection" and just finished a z-pak  dr Anderson Malta aware    Social History   Social History  . Marital Status: Married    Spouse Name: N/A  . Number of Children: N/A  . Years of Education: N/A   Occupational History  . Not on file.   Social History Main Topics  . Smoking status: Former Smoker -- 30 years    Quit date: 06/03/2004  . Smokeless tobacco: Never Used  . Alcohol Use: No     Comment: rarely  . Drug Use: No  . Sexual Activity: Not on file   Other Topics Concern  . Not on file   Social History Narrative    Past Surgical History  Procedure Laterality Date  . Lymph node biospy  2000  . Colonoscopy  2003  . 2-d echocardiogram  April 2002    ejection fraction 55 to 65%  . Cataract extraction w/phaco   06/19/2011    Procedure: CATARACT EXTRACTION PHACO AND INTRAOCULAR LENS PLACEMENT (IOC);  Surgeon: Adonis Brook, MD;  Location: [facility];  Service: Ophthalmology;  Laterality: Right;  [place] FMA50BM +18.00 D Right    Family History  Problem Relation Age of Onset  . Diabetes Mother   . Heart disease Mother   . Heart failure Father   . Anesthesia problems Neg Hx   . Cancer Sister   . Cancer Brother     Allergies  Allergen Reactions  . Codeine Phosphate     Itching and gittery    Current Outpatient Prescriptions on File Prior to Visit  Medication Sig Dispense Refill  . ACCU-CHEK FASTCLIX LANCETS MISC USE 1 LANCET DAILY AS DIRECTED 102 each 1  . amLODipine (NORVASC) 10 MG tablet TAKE 1 TABLET BY MOUTH ONCE DAILY 90 tablet 1  . aspirin 325 MG tablet Take 325 mg by mouth daily.      Marland Kitchen atorvastatin (LIPITOR) 10 MG tablet TAKE 1 TABLET BY MOUTH ONCE DAILY 90 tablet 1  . B-D UF III MINI PEN NEEDLES 31G X 5 MM MISC USE TWICE A DAY  100 each 5  . benzonatate (TESSALON) 100 MG capsule TAKE ONE CAPSULE TWICE A DAY AS NEEDED 30 capsule 3  . clotrimazole (LOTRIMIN) 1 % cream Apply topically 2 (two) times daily. 30 g 0  . Dextromethorphan-Guaifenesin (MUCINEX DM MAXIMUM STRENGTH) 60-1200 MG TB12 Take 1 tablet by mouth 2 (two) times daily.    . Dulaglutide (TRULICITY) 1.5 MA/2.6JF SOPN Inject 1.5 mg into the skin once a week. 4 pen 6  . FARXIGA 10 MG TABS tablet TAKE 1 TABLET EVERY DAY 90 tablet 1  . fexofenadine-pseudoephedrine (ALLEGRA-D) 60-120 MG per tablet Take 1 tablet by mouth daily as needed. For seasonal allergies    . fluticasone (FLONASE) 50 MCG/ACT nasal spray Place 2 sprays into both nostrils daily. 16 g 6  . glimepiride (AMARYL) 4 MG tablet Take 1 tablet (4 mg total) by mouth daily with breakfast. 180 tablet 1  . glucose blood (ACCU-CHEK AVIVA PLUS) test strip 1 each by Other route daily. Use as instructed 100 each 12  . KLOR-CON M20 20 MEQ tablet TAKE 1 TABLET BY MOUTH ONCE DAILY  90 tablet 3  . LORazepam (ATIVAN) 0.5 MG tablet TAKE 2 TABLETS BY MOUTH TWICE DAILY AS NEEDED 60 tablet 1  . losartan (COZAAR) 100 MG tablet Take 1 tablet (100 mg total) by mouth daily. 90 tablet 3  . metFORMIN (GLUCOPHAGE) 1000 MG tablet TAKE 1 TABLET BY MOUTH TWICE A DAY 180 tablet 2  . metoprolol (LOPRESSOR) 50 MG tablet TAKE 1 TABLET BY MOUTH ONCE DAILY 90 tablet 1  . Multiple Vitamins-Minerals (CENTRUM SILVER PO) Take 1 tablet by mouth daily.      . OMEGA 3 1200 MG CAPS Take 1,200 mg by mouth daily.    Vladimir Faster Glycol-Propyl Glycol (SYSTANE ULTRA OP) Place 1-2 drops into both eyes as needed.     . terazosin (HYTRIN) 5 MG capsule TAKE 1 CAPSULE BY MOUTH ONCE DAILY 90 capsule 3  . Triamcinolone Acetonide (NASACORT AQ NA) Place 2 sprays into the nose as needed.     . VENTOLIN HFA 108 (90 BASE) MCG/ACT inhaler INHALE 2 PUFFS EVERY 4 HOURS AS NEEDED FOR WHEEZING. *NEEDS OFFICE VISIT* 18 Inhaler 1   No current facility-administered medications on file prior to visit.    BP 126/80 mmHg  Pulse 91  Temp(Src) 98 F (36.7 C) (Oral)  Resp 20  Ht 5' 5.5" (1.664 m)  Wt 168 lb (76.204 kg)  BMI 27.52 kg/m2  SpO2 96%     Review of Systems  Constitutional: Negative for fever, chills, appetite change and fatigue.  HENT: Negative for congestion, dental problem, ear pain, hearing loss, sore throat, tinnitus, trouble swallowing and voice change.   Eyes: Negative for pain, discharge and visual disturbance.  Respiratory: Negative for cough, chest tightness, wheezing and stridor.   Cardiovascular: Negative for chest pain, palpitations and leg swelling.  Gastrointestinal: Negative for nausea, vomiting, abdominal pain, diarrhea, constipation, blood in stool and abdominal distention.  Genitourinary: Negative for urgency, hematuria, flank pain, discharge, difficulty urinating and genital sores.  Musculoskeletal: Negative for myalgias, back pain, joint swelling, arthralgias, gait problem and neck  stiffness.  Skin: Negative for rash.  Neurological: Negative for dizziness, syncope, speech difficulty, weakness, numbness and headaches.  Hematological: Negative for adenopathy. Does not bruise/bleed easily.  Psychiatric/Behavioral: Negative for behavioral problems and dysphoric mood. The patient is not nervous/anxious.        Objective:   Physical Exam  Constitutional: He is oriented to person, place, and time. He  appears well-developed.  HENT:  Head: Normocephalic.  Right Ear: External ear normal.  Left Ear: External ear normal.  Eyes: Conjunctivae and EOM are normal.  Neck: Normal range of motion.  Cardiovascular: Normal rate and normal heart sounds.   Pulmonary/Chest: Breath sounds normal.  Abdominal: Bowel sounds are normal.  Musculoskeletal: Normal range of motion. He exhibits no edema or tenderness.  Neurological: He is alert and oriented to person, place, and time.  Psychiatric: He has a normal mood and affect. His behavior is normal.          Assessment & Plan:   Diabetes mellitus.  Will check a hemoglobin A1c.  Nonpharmacologic measures discussed Preventive care.  Flu vaccine administered today Essential hypertension, stable Dyslipidemia.  Continue statin therapy;  weight loss exercise better eating habits.  All encouraged.  Recheck 3 months

## 2015-03-14 ENCOUNTER — Other Ambulatory Visit: Payer: Self-pay | Admitting: Internal Medicine

## 2015-04-03 ENCOUNTER — Other Ambulatory Visit: Payer: Self-pay | Admitting: Internal Medicine

## 2015-04-13 ENCOUNTER — Encounter: Payer: Self-pay | Admitting: Internal Medicine

## 2015-04-13 ENCOUNTER — Ambulatory Visit (INDEPENDENT_AMBULATORY_CARE_PROVIDER_SITE_OTHER): Payer: Medicare Other | Admitting: Internal Medicine

## 2015-04-13 VITALS — BP 118/74 | HR 86 | Ht 67.0 in | Wt 168.0 lb

## 2015-04-13 DIAGNOSIS — G4733 Obstructive sleep apnea (adult) (pediatric): Secondary | ICD-10-CM | POA: Diagnosis not present

## 2015-04-13 DIAGNOSIS — I509 Heart failure, unspecified: Secondary | ICD-10-CM | POA: Diagnosis not present

## 2015-04-13 NOTE — Assessment & Plan Note (Signed)
History and body habitus are consistent with obstructive sleep apnea. Significant medical comorbidities as noted. It will be better to get an unattended sleep study.

## 2015-04-13 NOTE — Progress Notes (Signed)
04/13/2015-64 year old male former smoker referred by Dr. Lianne Cure for snoring. Epworth Score: 8. Medical history including Hodgkin's lymphoma, DM 2, HBP, CHF, CVA Long history of wife complaining he snores loudly and, got his daytime sleepiness especially if he is passenger in a car. An over-the-counter oral appliance may have helped a little. He wakes at night and can't get back to sleep. 2 cups of coffee in the morning with no caffeine later and no sleep medicines. No ENT surgery.  Prior to Admission medications   Medication Sig Start Date End Date Taking? Authorizing Provider  ACCU-CHEK FASTCLIX LANCETS MISC USE 1 LANCET DAILY AS DIRECTED 08/15/14  Yes Marletta Lor, MD  amLODipine (NORVASC) 10 MG tablet TAKE 1 TABLET BY MOUTH ONCE DAILY 11/04/14  Yes Marletta Lor, MD  aspirin 325 MG tablet Take 325 mg by mouth daily.     Yes Historical Provider, MD  atorvastatin (LIPITOR) 10 MG tablet TAKE 1 TABLET BY MOUTH ONCE DAILY 10/06/14  Yes Marletta Lor, MD  B-D UF III MINI PEN NEEDLES 31G X 5 MM MISC USE TWICE A DAY 08/24/10  Yes Marletta Lor, MD  benzonatate (TESSALON) 100 MG capsule TAKE ONE CAPSULE TWICE A DAY AS NEEDED 12/02/14  Yes Marletta Lor, MD  clotrimazole (LOTRIMIN) 1 % cream Apply topically 2 (two) times daily. 12/14/12  Yes Marletta Lor, MD  Dextromethorphan-Guaifenesin [facility] DM MAXIMUM STRENGTH) 60-1200 MG TB12 Take 1 tablet by mouth 2 (two) times daily.   Yes Historical Provider, MD  Dulaglutide (TRULICITY) 1.5 0000000 SOPN Inject 1.5 mg into the skin once a week. 12/01/14  Yes Marletta Lor, MD  FARXIGA 10 MG TABS tablet TAKE 1 TABLET EVERY DAY 12/15/14  Yes Marletta Lor, MD  fexofenadine-pseudoephedrine (ALLEGRA-D) 60-120 MG per tablet Take 1 tablet by mouth daily as needed. For seasonal allergies   Yes Historical Provider, MD  fluticasone (FLONASE) 50 MCG/ACT nasal spray Place 2 sprays into both nostrils daily. 05/03/14  Yes Marletta Lor, MD  glimepiride (AMARYL) 4 MG tablet Take 1 tablet (4 mg total) by mouth daily with breakfast. 12/01/14  Yes Marletta Lor, MD  glucose blood (ACCU-CHEK AVIVA PLUS) test strip 1 each by Other route daily. Use as instructed 01/14/13  Yes Marletta Lor, MD  KLOR-CON M20 20 MEQ tablet TAKE 1 TABLET BY MOUTH ONCE DAILY 06/20/14  Yes Marletta Lor, MD  LORazepam (ATIVAN) 0.5 MG tablet TAKE 2 TABLETS BY MOUTH TWICE DAILY AS NEEDED 02/25/14  Yes Marletta Lor, MD  losartan (COZAAR) 100 MG tablet Take 1 tablet (100 mg total) by mouth daily. 11/23/14  Yes Marletta Lor, MD  metFORMIN (GLUCOPHAGE) 1000 MG tablet TAKE 1 TABLET BY MOUTH TWICE A DAY 09/15/14  Yes Marin Olp, MD  metoprolol (LOPRESSOR) 50 MG tablet TAKE 1 TABLET BY MOUTH EVERY DAY 04/03/15  Yes Marletta Lor, MD  Multiple Vitamins-Minerals (CENTRUM SILVER PO) Take 1 tablet by mouth daily.     Yes Historical Provider, MD  OMEGA 3 1200 MG CAPS Take 1,200 mg by mouth daily.   Yes Historical Provider, MD  Polyethyl Glycol-Propyl Glycol (SYSTANE ULTRA OP) Place 1-2 drops into both eyes as needed.    Yes Historical Provider, MD  terazosin (HYTRIN) 5 MG capsule TAKE 1 CAPSULE BY MOUTH ONCE DAILY 05/16/14  Yes Marletta Lor, MD  Triamcinolone Acetonide (NASACORT AQ NA) Place 2 sprays into the nose as needed.    Yes Historical Provider,  MD  VENTOLIN HFA 108 (90 BASE) MCG/ACT inhaler INHALE 2 PUFFS INTO THE LUNGS EVERY 4 HOURS AS NEEDED FOR WHEEZING *PT NEEDS OFFICE VISIT** 03/14/15  Yes Marletta Lor, MD   Family History  Problem Relation Age of Onset  . Diabetes Mother   . Heart disease Mother   . Heart failure Father   . Anesthesia problems Neg Hx   . Cancer Sister   . Cancer Brother    Social History   Social History  . Marital Status: Married    Spouse Name: N/A  . Number of Children: N/A  . Years of Education: N/A   Occupational History  . Not on file.   Social History  Main Topics  . Smoking status: Former Smoker -- 30 years    Quit date: 06/03/2004  . Smokeless tobacco: Never Used  . Alcohol Use: No     Comment: rarely  . Drug Use: No  . Sexual Activity: Not on file   Other Topics Concern  . Not on file   Social History Narrative    Past Medical History  Diagnosis Date  . 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 Hyperlipidemia   . CVA (cerebral vascular accident) ([place]) 2005    hx of right brain subcortical stroke  . Hodgkin's lymphoma ([place]) 2000    stage IV B status post ABCD chemotherapy -2000  . Pneumonia Feb 2008  . Diabetes mellitus type II   . Arthritis     joints,   . H/O hiatal hernia   . Radiculopathy     S1  . Hypertension     Dr. Vidal Schwalbe cardiology  . Sinus congestion 06/19/11    pt has had sinus "infection" and just finished a z-pak  dr Anderson Malta aware   Social History   Social History  . Marital Status: Married    Spouse Name: N/A  . Number of Children: N/A  . Years of Education: N/A   Occupational History  . Not on file.   Social History Main Topics  . Smoking status: Former Smoker -- 30 years    Quit date: 06/03/2004  . Smokeless tobacco: Never Used  . Alcohol Use: No     Comment: rarely  . Drug Use: No  . Sexual Activity: Not on file   Other Topics Concern  . Not on file   Social History Narrative   ROS-see HPI   Negative unless "+" Constitutional:    weight loss, night sweats, fevers, chills, + fatigue, lassitude. HEENT:    headaches, difficulty swallowing, tooth/dental problems, sore throat,       sneezing, itching, ear ache, nasal congestion, post nasal drip, snoring CV:    chest pain, orthopnea, PND, swelling in lower extremities, anasarca,                                                          dizziness, palpitations Resp:   shortness of breath with exertion or at rest.                productive cough,   non-productive cough, coughing up of blood.               change in color of mucus.  wheezing.   Skin:  rash or lesions. GI:  No-   heartburn, indigestion, abdominal pain, nausea, vomiting,  GU: + MS:   joint pain, stiffness, + Neuro-     nothing unusual Psych:  change in mood or affect.  depression or anxiety.   memory loss.  OBJ- Physical Exam General- Alert, Oriented, Affect-appropriate, Distress- none acute, + overweight Skin- rash-none, lesions- none, excoriation- none Lymphadenopathy- none Head- atraumatic            Eyes- Gross vision intact, PERRLA, conjunctivae and secretions clear            Ears- Hearing, canals-normal            Nose- Clear, no-Septal dev, mucus, polyps, erosion, perforation             Throat- Mallampati IV , mucosa clear , drainage- none, tonsils- atrophic Neck- flexible , trachea midline, no stridor , thyroid nl, carotid no bruit Chest - symmetrical excursion , unlabored           Heart/CV- RRR , no murmur , no gallop  , no rub, nl s1 s2                           - JVD- none , edema- none, stasis changes- none, varices- none           Lung- clear to P&A, wheeze- none, cough + light , dullness-none, rub- none           Chest wall-  Abd-  Br/ Gen/ Rectal- Not done, not indicated Extrem- cyanosis- none, clubbing, none, atrophy- none, strength- nl Neuro- grossly intact to observation

## 2015-04-13 NOTE — Patient Instructions (Addendum)
Order- Schedule split protocol NPSG   Dx OSA

## 2015-04-13 NOTE — Assessment & Plan Note (Signed)
Light cough noted, without rales or obvious peripheral edema. This problem is currently controlled based on exam.

## 2015-04-18 ENCOUNTER — Other Ambulatory Visit: Payer: Self-pay | Admitting: Internal Medicine

## 2015-05-01 ENCOUNTER — Other Ambulatory Visit: Payer: Self-pay | Admitting: Internal Medicine

## 2015-05-02 DIAGNOSIS — H02123 Mechanical ectropion of right eye, unspecified eyelid: Secondary | ICD-10-CM | POA: Diagnosis not present

## 2015-05-12 ENCOUNTER — Other Ambulatory Visit: Payer: Self-pay | Admitting: Internal Medicine

## 2015-05-16 ENCOUNTER — Other Ambulatory Visit: Payer: Self-pay | Admitting: Internal Medicine

## 2015-05-20 ENCOUNTER — Other Ambulatory Visit: Payer: Self-pay | Admitting: Internal Medicine

## 2015-05-22 ENCOUNTER — Ambulatory Visit (INDEPENDENT_AMBULATORY_CARE_PROVIDER_SITE_OTHER): Payer: Medicare Other | Admitting: Internal Medicine

## 2015-05-22 ENCOUNTER — Encounter: Payer: Self-pay | Admitting: Internal Medicine

## 2015-05-22 VITALS — BP 130/62 | HR 96 | Temp 98.2°F | Resp 20 | Ht 67.0 in | Wt 167.0 lb

## 2015-05-22 DIAGNOSIS — I10 Essential (primary) hypertension: Secondary | ICD-10-CM

## 2015-05-22 DIAGNOSIS — E1359 Other specified diabetes mellitus with other circulatory complications: Secondary | ICD-10-CM

## 2015-05-22 DIAGNOSIS — E785 Hyperlipidemia, unspecified: Secondary | ICD-10-CM

## 2015-05-22 LAB — HEMOGLOBIN A1C: Hgb A1c MFr Bld: 7.2 % — ABNORMAL HIGH (ref 4.6–6.5)

## 2015-05-22 NOTE — Progress Notes (Signed)
Pre visit review using our clinic review tool, if applicable. No additional management support is needed unless otherwise documented below in the visit note. 

## 2015-05-22 NOTE — Patient Instructions (Signed)
Limit your sodium (Salt) intake    It is important that you exercise regularly, at least 20 minutes 3 to 4 times per week.  If you develop chest pain or shortness of breath seek  medical attention.  You need to lose weight.  Consider a lower calorie diet and regular exercise. 

## 2015-05-22 NOTE — Progress Notes (Signed)
Subjective:    Patient ID: [name], male    DOB: 1951-05-14, 64 y.o.   MRN: [id number]  HPI  Lab Results  Component Value Date   HGBA1C 7.4* 02/23/2015    Wt Readings from Last 3 Encounters:  05/22/15 167 lb (75.751 kg)  04/13/15 168 lb (76.204 kg)  02/23/15 168 lb (76.69 kg)    64 year old patient who has type 2 diabetes.  He has essential hypertension as well as dyslipidemia.  Doing reasonably well.  He has had some sleep issues and is scheduled to see pulmonary medicine next month.  He does have OSA.  No hypoglycemia  Past Medical History  Diagnosis Date  . 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 Hyperlipidemia   . CVA (cerebral vascular accident) ([place]) 2005    hx of right brain subcortical stroke  . Hodgkin's lymphoma ([place]) 2000    stage IV B status post ABCD chemotherapy -2000  . Pneumonia Feb 2008  . Diabetes mellitus type II   . Arthritis     joints,   . H/O hiatal hernia   . Radiculopathy     S1  . Hypertension     Dr. Vidal Schwalbe cardiology  . Sinus congestion 06/19/11    pt has had sinus "infection" and just finished a z-pak  dr Anderson Malta aware    Social History   Social History  . Marital Status: Married    Spouse Name: N/A  . Number of Children: N/A  . Years of Education: N/A   Occupational History  . Not on file.   Social History Main Topics  . Smoking status: Former Smoker -- 30 years    Quit date: 06/03/2004  . Smokeless tobacco: Never Used  . Alcohol Use: No     Comment: rarely  . Drug Use: No  . Sexual Activity: Not on file   Other Topics Concern  . Not on file   Social History Narrative    Past Surgical History  Procedure Laterality Date  . Lymph node biospy  2000  . Colonoscopy  2003  . 2-d echocardiogram  April 2002    ejection fraction 55 to 65%  . Cataract extraction w/phaco  06/19/2011    Procedure: CATARACT EXTRACTION PHACO AND INTRAOCULAR LENS PLACEMENT (IOC);  Surgeon: Adonis Brook, MD;  Location: [facility];  Service: Ophthalmology;  Laterality: Right;  [place] FMA50BM +18.00 D Right    Family History  Problem Relation Age of Onset  . Diabetes Mother   . Heart disease Mother   . Heart failure Father   . Anesthesia problems Neg Hx   . Cancer Sister   . Cancer Brother     Allergies  Allergen Reactions  . Codeine Phosphate     Itching and gittery    Current Outpatient Prescriptions on File Prior to Visit  Medication Sig Dispense Refill  . ACCU-CHEK FASTCLIX LANCETS MISC USE 1 LANCET DAILY AS DIRECTED 102 each 1  . amLODipine (NORVASC) 10 MG tablet TAKE 1 TABLET BY MOUTH ONCE DAILY 90 tablet 1  . aspirin 325 MG tablet Take 325 mg by mouth daily.      Marland Kitchen atorvastatin (LIPITOR) 10 MG tablet TAKE 1 TABLET BY MOUTH ONCE DAILY 90 tablet 1  . B-D UF III MINI PEN NEEDLES 31G X 5 MM MISC USE TWICE A DAY 100 each 5  . benzonatate (TESSALON) 100 MG capsule TAKE ONE CAPSULE TWICE A DAY AS NEEDED  30 capsule 3  . clotrimazole (LOTRIMIN) 1 % cream Apply topically 2 (two) times daily. 30 g 0  . Dextromethorphan-Guaifenesin (MUCINEX DM MAXIMUM STRENGTH) 60-1200 MG TB12 Take 1 tablet by mouth 2 (two) times daily.    . Dulaglutide (TRULICITY) 1.5 0000000 SOPN Inject 1.5 mg into the skin once a week. 4 pen 6  . FARXIGA 10 MG TABS tablet TAKE 1 TABLET EVERY DAY 90 tablet 1  . fexofenadine-pseudoephedrine (ALLEGRA-D) 60-120 MG per tablet Take 1 tablet by mouth daily as needed. For seasonal allergies    . fluticasone (FLONASE) 50 MCG/ACT nasal spray USE 2 SPRAYS IN EACH NOSTRIL ONCE DAILY 16 g 5  . glimepiride (AMARYL) 4 MG tablet Take 1 tablet (4 mg total) by mouth daily with breakfast. 180 tablet 1  . glucose blood (ACCU-CHEK AVIVA PLUS) test strip 1 each by Other route daily. Use as instructed 100 each 12  . KLOR-CON M20 20 MEQ tablet TAKE 1 TABLET BY MOUTH ONCE DAILY 90 tablet 3  . LORazepam (ATIVAN) 0.5 MG tablet TAKE 2 TABLETS BY MOUTH TWICE DAILY AS NEEDED 60 tablet 1   . losartan (COZAAR) 100 MG tablet Take 1 tablet (100 mg total) by mouth daily. 90 tablet 3  . metFORMIN (GLUCOPHAGE) 1000 MG tablet TAKE 1 TABLET BY MOUTH TWICE A DAY 180 tablet 2  . metoprolol (LOPRESSOR) 50 MG tablet TAKE 1 TABLET BY MOUTH EVERY DAY 90 tablet 1  . Multiple Vitamins-Minerals (CENTRUM SILVER PO) Take 1 tablet by mouth daily.      . OMEGA 3 1200 MG CAPS Take 1,200 mg by mouth daily.    Vladimir Faster Glycol-Propyl Glycol (SYSTANE ULTRA OP) Place 1-2 drops into both eyes as needed.     . terazosin (HYTRIN) 5 MG capsule TAKE 1 CAPSULE EVERY DAY 90 capsule 3  . Triamcinolone Acetonide (NASACORT AQ NA) Place 2 sprays into the nose as needed.     . VENTOLIN HFA 108 (90 BASE) MCG/ACT inhaler INHALE 2 PUFFS INTO THE LUNGS EVERY 4 HOURS AS NEEDED FOR WHEEZING *PT NEEDS OFFICE VISIT** 18 Inhaler 1   No current facility-administered medications on file prior to visit.    BP 130/62 mmHg  Pulse 96  Temp(Src) 98.2 F (36.8 C) (Oral)  Resp 20  Ht 5\' 7"  (1.702 m)  Wt 167 lb (75.751 kg)  BMI 26.15 kg/m2  SpO2 94%     Review of Systems  Constitutional: Positive for fatigue. Negative for fever, chills and appetite change.  HENT: Negative for congestion, dental problem, ear pain, hearing loss, sore throat, tinnitus, trouble swallowing and voice change.   Eyes: Negative for pain, discharge and visual disturbance.  Respiratory: Negative for cough, chest tightness, wheezing and stridor.   Cardiovascular: Negative for chest pain, palpitations and leg swelling.  Gastrointestinal: Negative for nausea, vomiting, abdominal pain, diarrhea, constipation, blood in stool and abdominal distention.  Genitourinary: Negative for urgency, hematuria, flank pain, discharge, difficulty urinating and genital sores.  Musculoskeletal: Negative for myalgias, back pain, joint swelling, arthralgias, gait problem and neck stiffness.  Skin: Negative for rash.  Neurological: Negative for dizziness, syncope,  speech difficulty, weakness, numbness and headaches.  Hematological: Negative for adenopathy. Does not bruise/bleed easily.  Psychiatric/Behavioral: Positive for sleep disturbance. Negative for behavioral problems and dysphoric mood. The patient is not nervous/anxious.        Objective:   Physical Exam  Constitutional: He is oriented to person, place, and time. He appears well-developed.  HENT:  Head: Normocephalic.  Right  Ear: External ear normal.  Left Ear: External ear normal.  Eyes: Conjunctivae and EOM are normal.  Neck: Normal range of motion.  Cardiovascular: Normal rate and normal heart sounds.   Pulmonary/Chest: He has rales.  Abdominal: Bowel sounds are normal.  Musculoskeletal: Normal range of motion. He exhibits no edema or tenderness.  Neurological: He is alert and oriented to person, place, and time.  Psychiatric: He has a normal mood and affect. His behavior is normal.          Assessment & Plan:   Diabetes mellitus.  Will check a hemoglobin A1c Essential hypertension, stable Dyslipidemia.  Continue statin therapy   Recheck 3 months

## 2015-05-23 DIAGNOSIS — H5712 Ocular pain, left eye: Secondary | ICD-10-CM | POA: Diagnosis not present

## 2015-05-23 DIAGNOSIS — H04129 Dry eye syndrome of unspecified lacrimal gland: Secondary | ICD-10-CM | POA: Diagnosis not present

## 2015-05-23 DIAGNOSIS — H5310 Unspecified subjective visual disturbances: Secondary | ICD-10-CM | POA: Diagnosis not present

## 2015-05-24 ENCOUNTER — Other Ambulatory Visit: Payer: Self-pay | Admitting: Internal Medicine

## 2015-06-01 ENCOUNTER — Encounter (HOSPITAL_BASED_OUTPATIENT_CLINIC_OR_DEPARTMENT_OTHER): Payer: 59

## 2015-06-01 ENCOUNTER — Ambulatory Visit (HOSPITAL_BASED_OUTPATIENT_CLINIC_OR_DEPARTMENT_OTHER): Payer: Medicare Other | Attending: Internal Medicine | Admitting: Radiology

## 2015-06-01 VITALS — Ht 67.0 in | Wt 164.0 lb

## 2015-06-01 DIAGNOSIS — I10 Essential (primary) hypertension: Secondary | ICD-10-CM | POA: Diagnosis not present

## 2015-06-01 DIAGNOSIS — E669 Obesity, unspecified: Secondary | ICD-10-CM | POA: Insufficient documentation

## 2015-06-01 DIAGNOSIS — G4733 Obstructive sleep apnea (adult) (pediatric): Secondary | ICD-10-CM | POA: Diagnosis not present

## 2015-06-01 DIAGNOSIS — G4736 Sleep related hypoventilation in conditions classified elsewhere: Secondary | ICD-10-CM | POA: Insufficient documentation

## 2015-06-01 DIAGNOSIS — R0683 Snoring: Secondary | ICD-10-CM | POA: Insufficient documentation

## 2015-06-01 DIAGNOSIS — R5383 Other fatigue: Secondary | ICD-10-CM | POA: Diagnosis not present

## 2015-06-05 DIAGNOSIS — G4733 Obstructive sleep apnea (adult) (pediatric): Secondary | ICD-10-CM | POA: Diagnosis not present

## 2015-06-05 DIAGNOSIS — G4736 Sleep related hypoventilation in conditions classified elsewhere: Secondary | ICD-10-CM | POA: Diagnosis not present

## 2015-06-05 NOTE — Progress Notes (Signed)
   NAME: [name] DATE OF BIRTH:  Jul 13, 1950 MEDICAL RECORD NUMBER [id number]  LOCATION: [facility]  PHYSICIAN: YOUNG,CLINTON D  DATE OF STUDY: 06/01/2015 CLINICAL INFORMATION Sleep Study Type: NPSG Indication for sleep study: Fatigue, Hypertension, Obesity, OSA Epworth Sleepiness Score: 9  SLEEP STUDY TECHNIQUE As per the AASM Manual for the Scoring of Sleep and Associated Events v2.3 (April 2016) with a hypopnea requiring 4% desaturations. The channels recorded and monitored were frontal, central and occipital EEG, electrooculogram (EOG), submentalis EMG (chin), nasal and oral airflow, thoracic and abdominal wall motion, anterior tibialis EMG, snore microphone, electrocardiogram, and pulse oximetry.  MEDICATIONS Patient's medications include: GLIMEPIRIDE, LIPITOR, AMLODIPINE, METFORMIN. Medications self-administered by patient during sleep study : No sleep medicine administered.  SLEEP ARCHITECTURE The study was initiated at 10:37:09 PM and ended at 4:49:48 AM. Sleep onset time was 17.7 minutes and the sleep efficiency was 73.9%. The total sleep time was 275.5 minutes. Stage REM latency was 85.5 minutes. The patient spent 20.50% of the night in stage N1 sleep, 67.89% in stage N2 sleep, 1.09% in stage N3 and 10.53% in REM. Alpha intrusion was absent. Supine sleep was 31.57%.  RESPIRATORY PARAMETERS The overall apnea/hypopnea index (AHI) was 7.6 per hour. There were 1 total apneas, including 1 obstructive, 0 central and 0 mixed apneas. There were 34 hypopneas and 4 RERAs. The AHI during Stage REM sleep was 33.1 per hour. AHI while supine was 12.4 per hour. The mean oxygen saturation was 88.33%. The minimum SpO2 during sleep was 80.00%. Soft snoring was noted during this study.  CARDIAC DATA The 2 lead EKG demonstrated sinus rhythm. The mean heart rate was 81.70 beats per minute. Other EKG findings include: None.  LEG MOVEMENT DATA The total PLMS were 0  with a resulting PLMS index of 0.00. Associated arousal with leg movement index was 0.0 .  IMPRESSIONS - Mild obstructive sleep apnea occurred during this study (AHI = 7.6/h). - There were not enough ealy events to meet protocol requirements for split CPAP titration - No significant central sleep apnea occurred during this study (CAI = 0.0/h). - Moderate oxygen desaturation was noted during this study (Min O2 = 80.00%, Mean saturation only 88.3%). - The patient snored with Soft snoring volume. - No cardiac abnormalities were noted during this study. - Clinically significant periodic limb movements did not occur during sleep. No significant associated arousals. -    - Obstructive Sleep Apnea (327.23 [G47.33 ICD-10]) - Nocturnal Hypoxemia (327.26 [G47.36 ICD-10])  RECOMMENDATIONS - Very mild obstructive sleep apnea. Return to discuss treatment options. - Avoid alcohol, sedatives and other CNS depressants that may worsen sleep apnea and disrupt normal sleep architecture. - Sleep hygiene should be reviewed to assess factors that may improve sleep quality. - Weight management and regular exercise should be initiated or continued if appropriate.  DIAGNOSIS [name] Diplomate, American Board of Sleep Medicine  ELECTRONICALLY SIGNED ON:  06/05/2015, 12:26 PM [place] PH: [phone]   FX: [phone] [place]

## 2015-06-08 ENCOUNTER — Telehealth: Payer: Self-pay | Admitting: Oncology

## 2015-06-08 ENCOUNTER — Ambulatory Visit (HOSPITAL_BASED_OUTPATIENT_CLINIC_OR_DEPARTMENT_OTHER): Payer: Medicare Other | Admitting: Oncology

## 2015-06-08 VITALS — BP 125/53 | HR 98 | Temp 98.2°F | Resp 21 | Ht 67.0 in | Wt 165.3 lb

## 2015-06-08 DIAGNOSIS — Z8571 Personal history of Hodgkin lymphoma: Secondary | ICD-10-CM | POA: Diagnosis not present

## 2015-06-08 DIAGNOSIS — C8190 Hodgkin lymphoma, unspecified, unspecified site: Secondary | ICD-10-CM

## 2015-06-08 NOTE — Telephone Encounter (Signed)
per pof to sch pt appt-gave pt copy of avs °

## 2015-06-08 NOTE — Progress Notes (Signed)
  [name] OFFICE PROGRESS NOTE   Diagnosis: Hodgkin's disease  INTERVAL HISTORY:   [name] returns as scheduled. He reports intermittent skin lesions that resolved spontaneously over a few months. Good appetite. He is exercising. He reports malaise. He is followed closely by Dr. Burnice Logan for management of diabetes.  Objective:  Vital signs in last 24 hours:  Blood pressure 125/53, pulse 98, temperature 98.2 F (36.8 C), temperature source Oral, resp. rate 21, height 5\' 7"  (1.702 m), weight 165 lb 4.8 oz (74.98 kg), SpO2 94 %.    HEENT: Neck without mass Lymphatics: No cervical, supraclavicular, axillary, or inguinal nodes Resp: Distant breath sounds, no respiratory distress Cardio: Regular rate and rhythm GI: No hepatosplenomegaly, no mass, nontender Vascular: No leg edema  Skin: No suspicious skin lesions    Medications: I have reviewed the patient's current medications.  Assessment/Plan: 1. Hodgkins lymphoma , stage IV, diagnosed in May 2000 - he remains in clinical remission. 2. History of a nodular skin rash with a biopsy August 02, 2006, confirming a CD30 positive lymphoproliferative disorder. He reports intermittent nodular skin lesions over the past few years that spontaneously resolve and occur months apart. No apparent lesions today.    Disposition:  [name] remains in clinical remission from Hodgkin's disease. No evidence of progressive lymphoma involving the skin. He would like to continue follow-up at the [facility]. He will return for an office visit in one year. He will contact us in the interim for new symptoms.  Betsy Coder, MD  06/08/2015  10:35 AM

## 2015-06-14 ENCOUNTER — Other Ambulatory Visit: Payer: Self-pay | Admitting: Internal Medicine

## 2015-06-17 ENCOUNTER — Other Ambulatory Visit: Payer: Self-pay | Admitting: Family Medicine

## 2015-06-20 ENCOUNTER — Other Ambulatory Visit: Payer: Self-pay | Admitting: Internal Medicine

## 2015-06-26 ENCOUNTER — Other Ambulatory Visit: Payer: Self-pay | Admitting: Internal Medicine

## 2015-06-30 ENCOUNTER — Other Ambulatory Visit: Payer: Self-pay | Admitting: Internal Medicine

## 2015-07-04 DIAGNOSIS — H02412 Mechanical ptosis of left eyelid: Secondary | ICD-10-CM | POA: Diagnosis not present

## 2015-07-04 DIAGNOSIS — H02123 Mechanical ectropion of right eye, unspecified eyelid: Secondary | ICD-10-CM | POA: Diagnosis not present

## 2015-07-13 ENCOUNTER — Other Ambulatory Visit: Payer: Self-pay | Admitting: Internal Medicine

## 2015-07-27 ENCOUNTER — Ambulatory Visit (HOSPITAL_BASED_OUTPATIENT_CLINIC_OR_DEPARTMENT_OTHER): Payer: 59

## 2015-08-11 ENCOUNTER — Ambulatory Visit (INDEPENDENT_AMBULATORY_CARE_PROVIDER_SITE_OTHER): Payer: Medicare Other | Admitting: Internal Medicine

## 2015-08-11 ENCOUNTER — Encounter: Payer: Self-pay | Admitting: Internal Medicine

## 2015-08-11 VITALS — BP 120/72 | HR 106 | Ht 67.0 in | Wt 158.6 lb

## 2015-08-11 DIAGNOSIS — G4733 Obstructive sleep apnea (adult) (pediatric): Secondary | ICD-10-CM

## 2015-08-11 NOTE — Assessment & Plan Note (Signed)
With mild obstructive apnea, he might do well enough with a little weight loss and some effort to sleep off flat of his back. Otherwise uses CPAP or an oral appliance would be reasonable choice. He is going to consider options and let us know what he would like to do.

## 2015-08-11 NOTE — Progress Notes (Signed)
04/13/2015-65 year old male former smoker referred by Dr. Lianne Cure for snoring. Epworth Score: 8. Medical history including Hodgkin's lymphoma, DM 2, HBP, CHF, CVA Long history of wife complaining he snores loudly and, got his daytime sleepiness especially if he is passenger in a car. An over-the-counter oral appliance may have helped a little. He wakes at night and can't get back to sleep. 2 cups of coffee in the morning with no caffeine later and no sleep medicines. No ENT surgery.  08/11/2015-65 year old male former smoker followed for OSA, complicated by history Hodgkin's lymphoma, DM 2, HBP, CHF, CVA FOLLOWS FOR: Pt here to review split night sleep study results. NPSG- 06/01/15  AHI 7.6/ hr, desat to 80%, body weight  We reviewed sleep study, medical concerns, sleep habits, driving responsibility, treatment options. He is going to decide whether conservative management, CPAP or an oral appliance would be best for him.   ROS-see HPI   Negative unless "+" Constitutional:    weight loss, night sweats, fevers, chills, + fatigue, lassitude. HEENT:    headaches, difficulty swallowing, tooth/dental problems, sore throat,       sneezing, itching, ear ache, nasal congestion, post nasal drip, snoring CV:    chest pain, orthopnea, PND, swelling in lower extremities, anasarca,                                                          dizziness, palpitations Resp:   shortness of breath with exertion or at rest.                productive cough,   non-productive cough, coughing up of blood.              change in color of mucus.  wheezing.   Skin:    rash or lesions. GI:  No-   heartburn, indigestion, abdominal pain, nausea, vomiting,  GU: + MS:   joint pain, stiffness, + Neuro-     nothing unusual Psych:  change in mood or affect.  depression or anxiety.   memory loss.  OBJ- Physical Exam General- Alert, Oriented, Affect-appropriate, Distress- none acute, + overweight Skin- rash-none, lesions-  none, excoriation- none Lymphadenopathy- none Head- atraumatic            Eyes- Gross vision intact, PERRLA, conjunctivae and secretions clear            Ears- Hearing, canals-normal            Nose- Clear, no-Septal dev, mucus, polyps, erosion, perforation             Throat- Mallampati IV , mucosa clear , drainage- none, tonsils- atrophic, + teeth in fair repair Neck- flexible , trachea midline, no stridor , thyroid nl, carotid no bruit Chest - symmetrical excursion , unlabored           Heart/CV- RRR , no murmur , no gallop  , no rub, nl s1 s2                           - JVD- none , edema- none, stasis changes- none, varices- none           Lung- clear to P&A, wheeze- none, cough + light , dullness-none, rub- none  Chest wall-  Abd-  Br/ Gen/ Rectal- Not done, not indicated Extrem- cyanosis- none, clubbing, none, atrophy- none, strength- nl Neuro- grossly intact to observation

## 2015-08-11 NOTE — Patient Instructions (Signed)
Your sleep study showed mild obstructive sleep apnea. It may help some to keep a healthy weight and to sleep off the flat of your back.  If we need to do more, then either CPAP pressure mask therapy or a fitted oral appliance would be reasonable alternatives.  If you decide you want to go forward with an intervention for obstructive sleep apnea, then let us know what you would like to try and we will help set it up.

## 2015-08-21 ENCOUNTER — Ambulatory Visit (INDEPENDENT_AMBULATORY_CARE_PROVIDER_SITE_OTHER): Payer: Medicare Other | Admitting: Internal Medicine

## 2015-08-21 ENCOUNTER — Encounter: Payer: Self-pay | Admitting: Internal Medicine

## 2015-08-21 VITALS — BP 130/64 | HR 98 | Temp 98.2°F | Resp 20 | Ht 67.0 in | Wt 161.0 lb

## 2015-08-21 DIAGNOSIS — G4733 Obstructive sleep apnea (adult) (pediatric): Secondary | ICD-10-CM

## 2015-08-21 DIAGNOSIS — I10 Essential (primary) hypertension: Secondary | ICD-10-CM

## 2015-08-21 DIAGNOSIS — E785 Hyperlipidemia, unspecified: Secondary | ICD-10-CM | POA: Diagnosis not present

## 2015-08-21 DIAGNOSIS — Z8679 Personal history of other diseases of the circulatory system: Secondary | ICD-10-CM | POA: Diagnosis not present

## 2015-08-21 DIAGNOSIS — E0851 Diabetes mellitus due to underlying condition with diabetic peripheral angiopathy without gangrene: Secondary | ICD-10-CM

## 2015-08-21 DIAGNOSIS — R5383 Other fatigue: Secondary | ICD-10-CM | POA: Diagnosis not present

## 2015-08-21 LAB — CBC WITH DIFFERENTIAL/PLATELET
Basophils Absolute: 0 10*3/uL (ref 0.0–0.1)
Basophils Relative: 0.1 % (ref 0.0–3.0)
EOS PCT: 2.1 % (ref 0.0–5.0)
Eosinophils Absolute: 0.1 10*3/uL (ref 0.0–0.7)
HCT: 36.8 % — ABNORMAL LOW (ref 39.0–52.0)
Hemoglobin: 11.7 g/dL — ABNORMAL LOW (ref 13.0–17.0)
LYMPHS ABS: 0.8 10*3/uL (ref 0.7–4.0)
Lymphocytes Relative: 16 % (ref 12.0–46.0)
MCHC: 31.9 g/dL (ref 30.0–36.0)
MCV: 82 fl (ref 78.0–100.0)
MONO ABS: 0.8 10*3/uL (ref 0.1–1.0)
MONOS PCT: 15 % — AB (ref 3.0–12.0)
NEUTROS ABS: 3.5 10*3/uL (ref 1.4–7.7)
NEUTROS PCT: 66.8 % (ref 43.0–77.0)
PLATELETS: 371 10*3/uL (ref 150.0–400.0)
RBC: 4.48 Mil/uL (ref 4.22–5.81)
RDW: 16.4 % — ABNORMAL HIGH (ref 11.5–15.5)
WBC: 5.3 10*3/uL (ref 4.0–10.5)

## 2015-08-21 LAB — TSH: TSH: 1.32 u[IU]/mL (ref 0.35–4.50)

## 2015-08-21 LAB — COMPREHENSIVE METABOLIC PANEL
ALT: 13 U/L (ref 0–53)
AST: 14 U/L (ref 0–37)
Albumin: 3.6 g/dL (ref 3.5–5.2)
Alkaline Phosphatase: 92 U/L (ref 39–117)
BILIRUBIN TOTAL: 1.1 mg/dL (ref 0.2–1.2)
BUN: 15 mg/dL (ref 6–23)
CO2: 28 meq/L (ref 19–32)
Calcium: 9.3 mg/dL (ref 8.4–10.5)
Chloride: 100 mEq/L (ref 96–112)
Creatinine, Ser: 0.95 mg/dL (ref 0.40–1.50)
GFR: 101.63 mL/min (ref >60.00)
GLUCOSE: 333 mg/dL — AB (ref 70–99)
POTASSIUM: 3.8 meq/L (ref 3.5–5.1)
SODIUM: 138 meq/L (ref 135–145)
TOTAL PROTEIN: 7 g/dL (ref 6.0–8.3)

## 2015-08-21 LAB — MICROALBUMIN / CREATININE URINE RATIO
Creatinine,U: 52 mg/dL
MICROALB UR: 19.9 mg/dL — AB (ref 0.0–1.9)
Microalb Creat Ratio: 38.3 mg/g — ABNORMAL HIGH (ref 0.0–30.0)

## 2015-08-21 LAB — LIPID PANEL
CHOL/HDL RATIO: 3
Cholesterol: 108 mg/dL (ref 0–200)
HDL: 31.1 mg/dL — AB (ref >39.00)
LDL Cholesterol: 55 mg/dL (ref 0–99)
NONHDL: 77.18
Triglycerides: 111 mg/dL (ref 0.0–149.0)
VLDL: 22.2 mg/dL (ref 0.0–40.0)

## 2015-08-21 LAB — HEMOGLOBIN A1C: Hgb A1c MFr Bld: 8.3 % — ABNORMAL HIGH (ref 4.6–6.5)

## 2015-08-21 NOTE — Progress Notes (Signed)
Pre visit review using our clinic review tool, if applicable. No additional management support is needed unless otherwise documented below in the visit note. 

## 2015-08-21 NOTE — Patient Instructions (Signed)
Limit your sodium (Salt) intake   Please check your hemoglobin A1c every 3 months    It is important that you exercise regularly, at least 20 minutes 3 to 4 times per week.  If you develop chest pain or shortness of breath seek  medical attention.  Please check your blood pressure on a regular basis.  If it is consistently greater than 150/90, please make an office appointment.  Return in 3 months for follow-up

## 2015-08-21 NOTE — Progress Notes (Signed)
Subjective:    Patient ID: [name], male    DOB: 08/31/1950, 65 y.o.   MRN: [id number]  HPI  Lab Results  Component Value Date   HGBA1C 7.2* 05/22/2015   Wt Readings from Last 3 Encounters:  08/21/15 161 lb (73.029 kg)  08/11/15 158 lb 9.6 oz (71.94 kg)  06/08/15 165 lb 4.8 oz (74.45 kg)   65 year old patient who has type 2 diabetes.  Hemoglobin A1c's have been improving.  He states his glycemic control has been good No focal complaints, but he does have a sense of fatigue.  He has been diagnosed with mild OSA due to chronic fatigue and daytime sleepiness.  His weight has improved. He has essential hypertension and dyslipidemia which has been stable. No symptoms of heart failure. Remains on statin therapy for dyslipidemia.  Past Medical History  Diagnosis Date  . 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 Hyperlipidemia   . CVA (cerebral vascular accident) ([place]) 2005    hx of right brain subcortical stroke  . Hodgkin's lymphoma ([place]) 2000    stage IV B status post ABCD chemotherapy -2000  . Pneumonia Feb 2008  . Diabetes mellitus type II   . Arthritis     joints,   . H/O hiatal hernia   . Radiculopathy     S1  . Hypertension     Dr. Vidal Schwalbe cardiology  . Sinus congestion 06/19/11    pt has had sinus "infection" and just finished a z-pak  dr Anderson Malta aware    Social History   Social History  . Marital Status: Married    Spouse Name: N/A  . Number of Children: N/A  . Years of Education: N/A   Occupational History  . Not on file.   Social History Main Topics  . Smoking status: Former Smoker -- 30 years    Quit date: 06/03/2004  . Smokeless tobacco: Never Used  . Alcohol Use: No     Comment: rarely  . Drug Use: No  . Sexual Activity: Not on file   Other Topics Concern  . Not on file   Social History Narrative    Past Surgical History  Procedure Laterality Date  . Lymph node biospy  2000  . Colonoscopy   2003  . 2-d echocardiogram  April 2002    ejection fraction 55 to 65%  . Cataract extraction w/phaco  06/19/2011    Procedure: CATARACT EXTRACTION PHACO AND INTRAOCULAR LENS PLACEMENT (IOC);  Surgeon: Adonis Brook, MD;  Location: [place];  Service: Ophthalmology;  Laterality: Right;  [place] FMA50BM +18.00 D Right    Family History  Problem Relation Age of Onset  . Diabetes Mother   . Heart disease Mother   . Heart failure Father   . Anesthesia problems Neg Hx   . Cancer Sister   . Cancer Brother     Allergies  Allergen Reactions  . Codeine Phosphate     Itching and gittery    Current Outpatient Prescriptions on File Prior to Visit  Medication Sig Dispense Refill  . ACCU-CHEK FASTCLIX LANCETS MISC USE 1 LANCET DAILY AS DIRECTED 102 each 1  . amLODipine (NORVASC) 10 MG tablet TAKE 1 TABLET BY MOUTH ONCE DAILY 90 tablet 1  . aspirin 325 MG tablet Take 325 mg by mouth daily.      Marland Kitchen atorvastatin (LIPITOR) 10 MG tablet TAKE 1 TABLET BY MOUTH ONCE DAILY 90 tablet 1  . B-D  UF III MINI PEN NEEDLES 31G X 5 MM MISC USE TWICE A DAY 100 each 5  . benzonatate (TESSALON) 100 MG capsule TAKE ONE CAPSULE TWICE A DAY AS NEEDED 30 capsule 3  . clotrimazole (LOTRIMIN) 1 % cream Apply topically 2 (two) times daily. 30 g 0  . Dextromethorphan-Guaifenesin (MUCINEX DM MAXIMUM STRENGTH) 60-1200 MG TB12 Take 1 tablet by mouth 2 (two) times daily.    . Dulaglutide (TRULICITY) 1.5 0000000 SOPN Inject 1.5 mg into the skin once a week. 4 pen 6  . FARXIGA 10 MG TABS tablet TAKE 1 TABLET EVERY DAY 90 tablet 1  . fexofenadine-pseudoephedrine (ALLEGRA-D) 60-120 MG per tablet Take 1 tablet by mouth daily as needed. For seasonal allergies    . fluticasone (FLONASE) 50 MCG/ACT nasal spray USE 2 SPRAYS IN EACH NOSTRIL ONCE DAILY 16 g 5  . glimepiride (AMARYL) 4 MG tablet TAKE 1 TABLET BY MOUTH TWICE DAILY 180 tablet 1  . glucose blood (ACCU-CHEK AVIVA PLUS) test strip 1 each by Other route daily. Use as  instructed 100 each 12  . ketorolac (ACULAR) 0.4 % SOLN PLACE 1 DROP IN LEFT EYE TWICE A DAY  3  . KLOR-CON M20 20 MEQ tablet TAKE 1 TABLET BY MOUTH EVERY DAY 90 tablet 1  . LORazepam (ATIVAN) 0.5 MG tablet TAKE 2 TABLETS TWICE A DAY AS NEEDED 60 tablet 2  . losartan (COZAAR) 100 MG tablet Take 1 tablet (100 mg total) by mouth daily. 90 tablet 3  . metFORMIN (GLUCOPHAGE) 1000 MG tablet TAKE 1 TABLET BY MOUTH TWICE A DAY 180 tablet 4  . metoprolol (LOPRESSOR) 50 MG tablet TAKE 1 TABLET BY MOUTH EVERY DAY 90 tablet 1  . Multiple Vitamins-Minerals (CENTRUM SILVER PO) Take 1 tablet by mouth daily.      . OMEGA 3 1200 MG CAPS Take 1,200 mg by mouth daily.    Vladimir Faster Glycol-Propyl Glycol (SYSTANE ULTRA OP) Place 1-2 drops into both eyes as needed.     . terazosin (HYTRIN) 5 MG capsule TAKE 1 CAPSULE EVERY DAY 90 capsule 3  . Triamcinolone Acetonide (NASACORT AQ NA) Place 2 sprays into the nose as needed.     . VENTOLIN HFA 108 (90 Base) MCG/ACT inhaler INHALE 2 PUFFS INTO THE LUNGS EVERY 4 HOURS AS NEEDED FOR WHEEZING *NEEDS OFFICE VISY* 18 Inhaler 1  . XIIDRA 5 % SOLN PLACE 1 DROP INTO EACH EYE TWICE A DAY  6   No current facility-administered medications on file prior to visit.    BP 130/64 mmHg  Pulse 98  Temp(Src) 98.2 F (36.8 C) (Oral)  Resp 20  Ht 5\' 7"  (1.702 m)  Wt 161 lb (73.029 kg)  BMI 25.21 kg/m2  SpO2 95%      Review of Systems  Constitutional: Positive for fatigue. Negative for fever, chills and appetite change.  HENT: Negative for congestion, dental problem, ear pain, hearing loss, sore throat, tinnitus, trouble swallowing and voice change.   Eyes: Negative for pain, discharge and visual disturbance.  Respiratory: Negative for cough, chest tightness, wheezing and stridor.   Cardiovascular: Negative for chest pain, palpitations and leg swelling.  Gastrointestinal: Negative for nausea, vomiting, abdominal pain, diarrhea, constipation, blood in stool and abdominal  distention.  Genitourinary: Negative for urgency, hematuria, flank pain, discharge, difficulty urinating and genital sores.  Musculoskeletal: Negative for myalgias, back pain, joint swelling, arthralgias, gait problem and neck stiffness.  Skin: Negative for rash.  Neurological: Positive for weakness. Negative for dizziness, syncope,  speech difficulty, numbness and headaches.  Hematological: Negative for adenopathy. Does not bruise/bleed easily.  Psychiatric/Behavioral: Negative for behavioral problems and dysphoric mood. The patient is not nervous/anxious.        Objective:   Physical Exam  Constitutional: He is oriented to person, place, and time. He appears well-developed.  HENT:  Head: Normocephalic.  Right Ear: External ear normal.  Left Ear: External ear normal.  Eyes: Conjunctivae and EOM are normal.  Neck: Normal range of motion.  Cardiovascular: Normal rate and normal heart sounds.   Pulmonary/Chest: Breath sounds normal.  O2 saturation 95  Abdominal: Bowel sounds are normal.  Musculoskeletal: Normal range of motion. He exhibits no edema or tenderness.  Neurological: He is alert and oriented to person, place, and time.  Psychiatric: He has a normal mood and affect. His behavior is normal.          Assessment & Plan:   Fatigue.  We'll check updated lab Diabetes.  Hemoglobin A1c's have been steadily decreasing on present regimen Dyslipidemia.  Continue atorvastatin Essential hypertension, stable Mild OSA.  Continue efforts at weight loss.  Patient is considering CPAP versus oral appliance   Recheck 3 months

## 2015-08-22 LAB — HEPATITIS C ANTIBODY: HCV Ab: NEGATIVE

## 2015-09-12 ENCOUNTER — Ambulatory Visit (INDEPENDENT_AMBULATORY_CARE_PROVIDER_SITE_OTHER): Payer: Medicare Other | Admitting: Family Medicine

## 2015-09-12 ENCOUNTER — Ambulatory Visit (INDEPENDENT_AMBULATORY_CARE_PROVIDER_SITE_OTHER): Payer: Medicare Other

## 2015-09-12 ENCOUNTER — Ambulatory Visit: Payer: Medicare Other

## 2015-09-12 VITALS — BP 124/76 | HR 98 | Temp 98.5°F | Resp 18 | Ht 68.0 in | Wt 157.0 lb

## 2015-09-12 DIAGNOSIS — R059 Cough, unspecified: Secondary | ICD-10-CM

## 2015-09-12 DIAGNOSIS — R0602 Shortness of breath: Secondary | ICD-10-CM

## 2015-09-12 DIAGNOSIS — J441 Chronic obstructive pulmonary disease with (acute) exacerbation: Secondary | ICD-10-CM | POA: Diagnosis not present

## 2015-09-12 DIAGNOSIS — R05 Cough: Secondary | ICD-10-CM

## 2015-09-12 DIAGNOSIS — E119 Type 2 diabetes mellitus without complications: Secondary | ICD-10-CM

## 2015-09-12 LAB — POCT CBC
Granulocyte percent: 88.1 %G — AB (ref 37–80)
HCT, POC: 36.7 % — AB (ref 43.5–53.7)
HEMOGLOBIN: 12.1 g/dL — AB (ref 14.1–18.1)
LYMPH, POC: 1.1 (ref 0.6–3.4)
MCH, POC: 26.8 pg — AB (ref 27–31.2)
MCHC: 33 g/dL (ref 31.8–35.4)
MCV: 81.1 fL (ref 80–97)
MID (cbc): 0.8 (ref 0–0.9)
MPV: 6.5 fL (ref 0–99.8)
POC GRANULOCYTE: 14 — AB (ref 2–6.9)
POC LYMPH PERCENT: 7 %L — AB (ref 10–50)
POC MID %: 4.9 %M (ref 0–12)
Platelet Count, POC: 358 10*3/uL (ref 142–424)
RBC: 4.52 M/uL — AB (ref 4.69–6.13)
RDW, POC: 16.8 %
WBC: 15.9 10*3/uL — AB (ref 4.6–10.2)

## 2015-09-12 LAB — GLUCOSE, POCT (MANUAL RESULT ENTRY): POC GLUCOSE: 385 mg/dL — AB (ref 70–99)

## 2015-09-12 MED ORDER — IPRATROPIUM BROMIDE 0.02 % IN SOLN
0.5000 mg | Freq: Once | RESPIRATORY_TRACT | Status: AC
  Administered 2015-09-12: 0.5 mg via RESPIRATORY_TRACT

## 2015-09-12 MED ORDER — BECLOMETHASONE DIPROPIONATE 80 MCG/ACT IN AERS: 2.0000 | INHALATION_SPRAY | Freq: Two times a day (BID) | RESPIRATORY_TRACT | Status: DC

## 2015-09-12 MED ORDER — LEVOFLOXACIN 500 MG PO TABS: 500.0000 mg | ORAL_TABLET | Freq: Every day | ORAL | Status: DC

## 2015-09-12 MED ORDER — HYDROCODONE-HOMATROPINE 5-1.5 MG/5ML PO SYRP: 5.0000 mL | ORAL_SOLUTION | ORAL | Status: DC | PRN

## 2015-09-12 MED ORDER — ALBUTEROL SULFATE (2.5 MG/3ML) 0.083% IN NEBU
2.5000 mg | INHALATION_SOLUTION | Freq: Once | RESPIRATORY_TRACT | Status: AC
  Administered 2015-09-12: 2.5 mg via RESPIRATORY_TRACT

## 2015-09-12 NOTE — Progress Notes (Signed)
Patient ID: [name], male    DOB: Dec 12, 1950  Age: 65 y.o. MRN: [id number]  Chief Complaint  Patient presents with  . Cough    5 Days    Subjective:   Patient is been sick for 5 or 6 days with a respiratory tract infection and cough. He has been using his albuterol inhaler at home, last used it 2 or 3 hours ago. He longer smokes. He does have a history of COPD. Has had pneumonia several times in the past. He has a pulse ox at home usually runs in the low 90s. He has been coughing more and short of breath. Bringing up gray sputum. Is felt hot but has not had a fever that he knows of.  Current allergies, medications, problem list, past/family and social histories reviewed.  Objective:  BP 124/76 mmHg  Pulse 98  Temp(Src) 98.5 F (36.9 C) (Oral)  Resp 18  Ht 5\' 8"  (1.727 m)  Wt 157 lb (71.215 kg)  BMI 23.88 kg/m2  SpO2 92%  PF 200 L/min  Coughing. He does not look in distress. His TMs are normal. Throat clear. Neck supple without nodes. No JVD. Chest is clear to auscultation. Poor air exchange. Peak flow was low at 200 with a predicted maximum of about 495. O2 sat was 95 when he came in but was fluctuating between 91 to size 95 Check it again sitting in the room.  Assessment & Plan:   Assessment: 1. Cough   2. Shortness of breath   3. COPD exacerbation ([place])   4. Type 2 diabetes mellitus without complication, without long-term current use of insulin (HCC)       Plan: Given neb treatment here and see what things to do.  Orders Placed This Encounter  Procedures  . DG Chest 2 View    Order Specific Question:  Reason for Exam (SYMPTOM  OR DIAGNOSIS REQUIRED)    Answer:  cough, sat 92%    Order Specific Question:  Preferred imaging location?    Answer:  External  . POCT CBC  . POCT glucose (manual entry)    Meds ordered this encounter  Medications  . albuterol (PROVENTIL) (2.5 MG/3ML) 0.083% nebulizer solution 2.5 mg    Sig:   . ipratropium (ATROVENT) nebulizer  solution 0.5 mg    Sig:   . levofloxacin (LEVAQUIN) 500 MG tablet    Sig: Take 1 tablet (500 mg total) by mouth daily.    Dispense:  7 tablet    Refill:  0  . beclomethasone (QVAR) 80 MCG/ACT inhaler    Sig: Inhale 2 puffs into the lungs 2 (two) times daily.    Dispense:  1 Inhaler    Refill:  1  . HYDROcodone-homatropine (HYCODAN) 5-1.5 MG/5ML syrup    Sig: Take 5 mLs by mouth every 4 (four) hours as needed.    Dispense:  120 mL    Refill:  0    Chest x-ray shows COPD and scarring along the fissure which is similar to a worse appearing x-ray of 3 years ago. I don't think anything represents acute findings. Radiology reading is still pending.  Results for orders placed or performed in visit on 09/12/15  POCT CBC  Result Value Ref Range   WBC 15.9 (A) 4.6 - 10.2 K/uL   Lymph, poc 1.1 0.6 - 3.4   POC LYMPH PERCENT 7.0 (A) 10 - 50 %L   MID (cbc) 0.8 0 - 0.9   POC MID % 4.9 0 -  12 %M   POC Granulocyte 14.0 (A) 2 - 6.9   Granulocyte percent 88.1 (A) 37 - 80 %G   RBC 4.52 (A) 4.69 - 6.13 M/uL   Hemoglobin 12.1 (A) 14.1 - 18.1 g/dL   HCT, POC 36.7 (A) 43.5 - 53.7 %   MCV 81.1 80 - 97 fL   MCH, POC 26.8 (A) 27 - 31.2 pg   MCHC 33.0 31.8 - 35.4 g/dL   RDW, POC 16.8 %   Platelet Count, POC 358 142 - 424 K/uL   MPV 6.5 0 - 99.8 fL  POCT glucose (manual entry)  Result Value Ref Range   POC Glucose 385 (A) 70 - 99 mg/dl   Glucose 385 circular cannot use steroids.   Patient Instructions   Drink plenty of fluids  Watch your blood sugar and avoid eating excessive carbohydrates  Take the Levaquin 1 daily  Use the Qvar 80 inhaler 2 inhalations twice daily. This is a topical cortisone for your lungs, not as good for the current problem as taking prednisone would be, but your blood sugar cannot tolerate the prednisone right now.  Therefore if you get abruptly worse with breathing go to the emergency room or call 911 if necessary. If you are not improving over the next few days you  should get rechecked either here or by your primary care preferably.  You can continue using the benzonatate cough pills  Continue using your albuterol inhaler every 4-6 hours  Take the Hycodan cough syrup 1/2 teaspoon initially to make sure that you tolerate it with your history of codeine allergy, and then you can use it 1 teaspoon every 4-6 hours (5 mL's) as needed.    IF you received an x-ray today, you will receive an invoice from [facility]. Please contact [facility] at [phone] with questions or concerns regarding your invoice.   IF you received labwork today, you will receive an invoice from [employer]. Please contact [facility] at [phone] with questions or concerns regarding your invoice.   Our billing staff will not be able to assist you with questions regarding bills from these companies.  You will be contacted with the lab results as soon as they are available. The fastest way to get your results is to activate your My Chart account. Instructions are located on the last page of this paperwork. If you have not heard from [place] regarding the results in 2 weeks, please contact this office.         Return if symptoms worsen or fail to improve.   HOPPER,DAVID, MD 09/12/2015

## 2015-09-12 NOTE — Patient Instructions (Addendum)
Drink plenty of fluids  Watch your blood sugar and avoid eating excessive carbohydrates  Take the Levaquin 1 daily  Use the Qvar 80 inhaler 2 inhalations twice daily. This is a topical cortisone for your lungs, not as good for the current problem as taking prednisone would be, but your blood sugar cannot tolerate the prednisone right now.  Therefore if you get abruptly worse with breathing go to the emergency room or call 911 if necessary. If you are not improving over the next few days you should get rechecked either here or by your primary care preferably.  You can continue using the benzonatate cough pills  Continue using your albuterol inhaler every 4-6 hours  Take the Hycodan cough syrup 1/2 teaspoon initially to make sure that you tolerate it with your history of codeine allergy, and then you can use it 1 teaspoon every 4-6 hours (5 mL's) as needed.    IF you received an x-ray today, you will receive an invoice from [facility]. Please contact [facility] at [phone] with questions or concerns regarding your invoice.   IF you received labwork today, you will receive an invoice from [employer]. Please contact [facility] at [phone] with questions or concerns regarding your invoice.   Our billing staff will not be able to assist you with questions regarding bills from these companies.  You will be contacted with the lab results as soon as they are available. The fastest way to get your results is to activate your My Chart account. Instructions are located on the last page of this paperwork. If you have not heard from [place] regarding the results in 2 weeks, please contact this office.

## 2015-09-14 ENCOUNTER — Telehealth: Payer: Self-pay

## 2015-09-14 NOTE — Telephone Encounter (Signed)
Stopped taking the cough medication b/c he was jittering.  He is throwing up two mornings after taking the antibiotic - he is taking food first.  Now attempting to drink orange juice.  He also had diarrhea     [phone]

## 2015-09-14 NOTE — Telephone Encounter (Signed)
Pt called again because he has not heard back

## 2015-09-14 NOTE — Telephone Encounter (Signed)
Call: If still not tolerating meds and not improving needs to be rechecked here or by his PCP.

## 2015-09-15 NOTE — Telephone Encounter (Signed)
Spoke with pt, he states he stopped taking the ABX. I advised him to come in but he is going out of town. I advised him to sop at an urgent care if he is not feeling better. Pt agreed.

## 2015-10-07 ENCOUNTER — Other Ambulatory Visit: Payer: Self-pay | Admitting: Internal Medicine

## 2015-10-07 ENCOUNTER — Ambulatory Visit (INDEPENDENT_AMBULATORY_CARE_PROVIDER_SITE_OTHER): Payer: Medicare Other | Admitting: Urgent Care

## 2015-10-07 VITALS — BP 118/64 | HR 104 | Temp 98.0°F | Resp 18 | Ht 68.0 in | Wt 157.4 lb

## 2015-10-07 DIAGNOSIS — J329 Chronic sinusitis, unspecified: Secondary | ICD-10-CM

## 2015-10-07 DIAGNOSIS — R52 Pain, unspecified: Secondary | ICD-10-CM | POA: Diagnosis not present

## 2015-10-07 DIAGNOSIS — J029 Acute pharyngitis, unspecified: Secondary | ICD-10-CM

## 2015-10-07 DIAGNOSIS — H6121 Impacted cerumen, right ear: Secondary | ICD-10-CM | POA: Diagnosis not present

## 2015-10-07 DIAGNOSIS — R0982 Postnasal drip: Secondary | ICD-10-CM

## 2015-10-07 MED ORDER — BENZONATATE 100 MG PO CAPS: 100.0000 mg | ORAL_CAPSULE | Freq: Three times a day (TID) | ORAL | Status: DC | PRN

## 2015-10-07 MED ORDER — MONTELUKAST SODIUM 10 MG PO TABS: 10.0000 mg | ORAL_TABLET | Freq: Every day | ORAL | Status: DC

## 2015-10-07 NOTE — Progress Notes (Signed)
    MRN: [id number] DOB: 1950/10/10  Subjective:   [name] is a 65 y.o. male presenting for chief complaint of Sore Throat; Generalized Body Aches; and Nasal Congestion  Reports 2 day history of sore throat, body aches, mild nasal congestion, fatigue. Has not tried any medications for his symptoms in particular. Of note, patient was last seen on 09/12/2015 for cough, COPD. He was prescribed Levaquin and Hycodan both of which he was unable to tolerate. However, his cough is now significantly improved. Denies fever, chest pain, shob, ear pain, sinus pain.  [name] has a current medication list which includes the following prescription(s): accu-chek fastclix lancets, amlodipine, aspirin, atorvastatin, b-d uf iii mini pen needles, beclomethasone, clotrimazole, mucinex dm maximum strength, dulaglutide, farxiga, fexofenadine-pseudoephedrine, fluticasone, glimepiride, glucose blood, ketorolac, klor-con m20, lorazepam, losartan, metformin, metoprolol, multiple vitamins-minerals, omega 3, polyethyl glycol-propyl glycol, terazosin, triamcinolone acetonide, ventolin hfa, and xiidra. Also is allergic to codeine phosphate.  [name]  has a past medical history of Left ventricular dysfunction; Hyperlipidemia; CVA (cerebral vascular accident) ([place]) (2005); Hodgkin's lymphoma ([place]) (2000); Pneumonia (Feb 2008); Diabetes mellitus type II; Arthritis; H/O hiatal hernia; Radiculopathy; Hypertension; and Sinus congestion (06/19/11). Also  has past surgical history that includes lymph node biospy (2000); Colonoscopy (2003); 2-d echocardiogram (April 2002); and Cataract extraction w/PHACO (06/19/2011).  Objective:   Vitals: BP 118/64 mmHg  Pulse 104  Temp(Src) 98 F (36.7 C) (Oral)  Resp 18  Ht 5\' 8"  (1.727 m)  Wt 157 lb 6.4 oz (71.396 kg)  BMI 23.94 kg/m2  SpO2 95%  Physical Exam  Constitutional: He is oriented to person, place, and time. He appears well-developed and well-nourished.  HENT:  TM's cerumen  impacted bilaterally, no effusions or erythema. Nasal turbinates pink and moist without sinus tenderness. Postnasal drip present, without oropharyngeal exudates, erythema or abscesses.  Eyes: Right eye exhibits no discharge. Left eye exhibits no discharge. No scleral icterus.  Neck: Normal range of motion. Neck supple.  Cardiovascular: Normal rate, regular rhythm and intact distal pulses.  Exam reveals no gallop and no friction rub.   No murmur heard. Pulmonary/Chest: No respiratory distress. He has no wheezes. He has no rales.  Lymphadenopathy:    He has no cervical adenopathy.  Neurological: He is alert and oriented to person, place, and time.  Skin: Skin is warm and dry.   Assessment and Plan :   1. Sore throat 2. Post-nasal drainage 3. Body aches - Likely undergoing viral illness, advised supportive care. Patient declined cough syrup. He will rtc if no improvement in 1 week.  4. Cerumen impaction, right - Ear lavage performed today.  Jaynee Eagles, PA-C [facility] [phone] 10/07/2015 4:09 PM

## 2015-10-07 NOTE — Patient Instructions (Addendum)
Make sure that you are taking your allergy medication including nasal steroid, oral antihistamine like Zyrtec, Allegra, Claritin. Also, start taking Singulair at night. Let me know if your symptoms persist in a week.    IF you received an x-ray today, you will receive an invoice from [facility]. Please contact [facility] at [phone] with questions or concerns regarding your invoice.   IF you received labwork today, you will receive an invoice from [employer]. Please contact [facility] at [phone] with questions or concerns regarding your invoice.   Our billing staff will not be able to assist you with questions regarding bills from these companies.  You will be contacted with the lab results as soon as they are available. The fastest way to get your results is to activate your My Chart account. Instructions are located on the last page of this paperwork. If you have not heard from [place] regarding the results in 2 weeks, please contact this office.

## 2015-10-09 ENCOUNTER — Other Ambulatory Visit: Payer: Self-pay | Admitting: Internal Medicine

## 2015-10-09 NOTE — Telephone Encounter (Signed)
Duplicate request

## 2015-10-09 NOTE — Addendum Note (Signed)
Addended by: Vernetta Honey on: 10/09/2015 02:46 PM   Modules accepted: Orders, Medications

## 2015-10-11 ENCOUNTER — Telehealth: Payer: Self-pay | Admitting: Internal Medicine

## 2015-10-11 NOTE — Telephone Encounter (Signed)
Pt states his insurance will only pay for  One touch meter. (no more accu check)  Pt needs a rx for a new one touch meter Lancets One touch test strips Pt test 1 x /day  Pt wants to know if he can still use his ACCU-CHEK FASTCLIX LANCETS because he likes them.  [facility] [address]

## 2015-10-12 MED ORDER — GLUCOSE BLOOD VI STRP: ORAL_STRIP | Status: AC

## 2015-10-12 MED ORDER — ONETOUCH VERIO IQ SYSTEM W/DEVICE KIT: PACK | Status: AC

## 2015-10-12 MED ORDER — ONETOUCH LANCETS MISC: Status: AC

## 2015-10-12 NOTE — Telephone Encounter (Signed)
Spoke to pt, told him I have One Touch Meter's here in the office if he wants to pick up. Pt said he would like to have One Touch Verio IQ. Told pt okay will send Rx's to the pharmacy for Meter, Test strips and Lancets. Pt verbalized understanding.

## 2015-10-13 ENCOUNTER — Telehealth: Payer: Self-pay | Admitting: Urgent Care

## 2015-10-13 NOTE — Telephone Encounter (Signed)
Left message for pt to call back  °

## 2015-10-13 NOTE — Telephone Encounter (Signed)
He is likely dealing with post-nasal drainage, which is likely causing his cough.   I presume that he continues to take the oral antihistamine (Claritin, Allegra or Zyrtec) and the Singulair, and that he continues to use the Flonase.  Is he using Mucinex? Is he having any sinus or facial pressure/pain? Any headache? Dizziness? Cough? What color is the drainage he's blowing out of his nose?

## 2015-10-13 NOTE — Telephone Encounter (Signed)
Patient was instructed to call office if not feeling better. Patient is having sore throat, nasal drainage, and he feels cold. He said body aches are feeling better. Please give patient a call [phone].

## 2015-10-13 NOTE — Telephone Encounter (Signed)
Patient Instructions     Make sure that you are taking your allergy medication including nasal steroid, oral antihistamine like Zyrtec, Allegra, Claritin. Also, start taking Singulair at night. Let me know if your symptoms persist in a week.

## 2015-10-16 NOTE — Telephone Encounter (Signed)
Per pt no longer coughing, still with nasal congestion. Unable to blow nose. No sinus pressure or facial pain. Fatigue. Is currently taking Singulair and Zyrtec allow with the Flonase. Has not taken mucinex.

## 2015-10-16 NOTE — Telephone Encounter (Signed)
Per pt will come back in on Friday to be seen.

## 2015-10-16 NOTE — Telephone Encounter (Signed)
Advise RTC for re-evaluation.

## 2015-10-17 ENCOUNTER — Other Ambulatory Visit: Payer: Self-pay | Admitting: Internal Medicine

## 2015-10-20 ENCOUNTER — Ambulatory Visit (INDEPENDENT_AMBULATORY_CARE_PROVIDER_SITE_OTHER): Payer: Medicare Other

## 2015-10-20 ENCOUNTER — Ambulatory Visit (INDEPENDENT_AMBULATORY_CARE_PROVIDER_SITE_OTHER): Payer: Medicare Other | Admitting: Physician Assistant

## 2015-10-20 VITALS — BP 124/76 | HR 81 | Temp 97.9°F | Resp 16 | Ht 66.5 in | Wt 156.0 lb

## 2015-10-20 DIAGNOSIS — R5383 Other fatigue: Secondary | ICD-10-CM

## 2015-10-20 DIAGNOSIS — R0989 Other specified symptoms and signs involving the circulatory and respiratory systems: Secondary | ICD-10-CM

## 2015-10-20 DIAGNOSIS — K30 Functional dyspepsia: Secondary | ICD-10-CM

## 2015-10-20 DIAGNOSIS — E0851 Diabetes mellitus due to underlying condition with diabetic peripheral angiopathy without gangrene: Secondary | ICD-10-CM | POA: Diagnosis not present

## 2015-10-20 DIAGNOSIS — R0981 Nasal congestion: Secondary | ICD-10-CM

## 2015-10-20 DIAGNOSIS — R1013 Epigastric pain: Secondary | ICD-10-CM

## 2015-10-20 DIAGNOSIS — R0689 Other abnormalities of breathing: Secondary | ICD-10-CM

## 2015-10-20 DIAGNOSIS — R05 Cough: Secondary | ICD-10-CM | POA: Diagnosis not present

## 2015-10-20 LAB — POCT CBC
Granulocyte percent: 66.9 %G (ref 37–80)
HEMATOCRIT: 37.5 % — AB (ref 43.5–53.7)
HEMOGLOBIN: 12.1 g/dL — AB (ref 14.1–18.1)
LYMPH, POC: 1.2 (ref 0.6–3.4)
MCH, POC: 26.3 pg — AB (ref 27–31.2)
MCHC: 32.3 g/dL (ref 31.8–35.4)
MCV: 81.5 fL (ref 80–97)
MID (cbc): 0.5 (ref 0–0.9)
MPV: 5.9 fL (ref 0–99.8)
PLATELET COUNT, POC: 418 10*3/uL (ref 142–424)
POC Granulocyte: 3.5 (ref 2–6.9)
POC LYMPH %: 22.8 % (ref 10–50)
POC MID %: 10.3 %M (ref 0–12)
RBC: 4.6 M/uL — AB (ref 4.69–6.13)
RDW, POC: 17.8 %
WBC: 5.2 10*3/uL (ref 4.6–10.2)

## 2015-10-20 LAB — GLUCOSE, POCT (MANUAL RESULT ENTRY): POC GLUCOSE: 343 mg/dL — AB (ref 70–99)

## 2015-10-20 MED ORDER — IPRATROPIUM BROMIDE 0.03 % NA SOLN: 2.0000 | Freq: Two times a day (BID) | NASAL | Status: DC

## 2015-10-20 NOTE — Patient Instructions (Signed)
I think that we can help you with the congestion. Please continue the Flonase and Singulair. Please ADD the ipratropium bromide (Atrovent) nasal spray. You may stop it if your symptoms resolve.  Stop the Zyrtec, as it MAY contribute to your sleepiness. Use Claritin (loratadine) or Allegra (fexofenadine) instead.  I am MORE concerned about the staggering, uncontrolled blood sugar, and sleep apnea. These conditions can be very serious and lead to devastating results, even death. Please contact your primary care provider on Monday to schedule a follow-up visit to discuss these issues.

## 2015-10-20 NOTE — Progress Notes (Signed)
Patient ID: [name], male    DOB: [birth year], 65 y.o.   MRN: [id number]  PCP: Nyoka Cowden, MD  Subjective:   Chief Complaint  Patient presents with  . Follow-up    Cough    HPI Presents for evaluation of persistent illness beginning the first week of April.  09/12/2015. CXR revealed only his chronic COPD changes. Neb in the office wasn't helpful. Prescribed Levaquin, Qvar and Hycodan. He completed the Qvar. Stopped Levaquin and Hycodan due to nausea/vomiting.  10/07/15.  Cough had resolved. He complained of 2 days of sore throat, body aches, mild nasal congestion, fatigue. Exam remarkable for post-nasal drainage and bilateral cerumen impaction. Ears were irrigated. Thought likely viral illness, and advised supportive care.   No longer coughing. It resolved about a week after onset, but he continues to take the tessalon perles. Feels stuffy. Feels a lot of indigestion, and wonders if it isn't all that drainage in the back of the throat. Using Singulair and Zyrtec and Flonase and Tessalon Perles  Dizziness is baseline, not worse. He is stumbling x a month or so. Feels like he wants to sleep all the time. This isn't new. Has been to the sleep lab, because he was having trouble falling and staying asleep. "Now I don't want to wake up." 3/10 visit with pulmonology. Sleep study was 12/16. He was given the option of CPAP, conservative management, oral appliance. Has not yet started using CPAP, because he developed this illness, and wasn't able to breathe well through his nose.   Review of Systems  Constitutional: Positive for fatigue. Negative for fever, chills, diaphoresis, activity change, appetite change and unexpected weight change.  HENT: Positive for congestion and postnasal drip. Negative for dental problem, drooling, ear discharge, ear pain, facial swelling, hearing loss, mouth sores, nosebleeds, rhinorrhea, sinus pressure, sneezing, sore throat, tinnitus, trouble  swallowing and voice change.   Eyes: Negative for photophobia and visual disturbance.  Respiratory: Negative for cough, choking, chest tightness, shortness of breath and wheezing.   Cardiovascular: Negative for chest pain, palpitations and leg swelling.  Gastrointestinal: Negative for nausea, vomiting, abdominal pain, diarrhea, constipation, blood in stool, abdominal distention, anal bleeding and rectal pain.       Indigestion  Genitourinary: Negative for dysuria, urgency, frequency and hematuria.  Musculoskeletal: Positive for gait problem ("stumbling").  Neurological: Positive for dizziness. Negative for speech difficulty, light-headedness, numbness and headaches.  Hematological: Negative for adenopathy.       Patient Active Problem List   Diagnosis Date Noted  . Obstructive sleep apnea 04/13/2015  . Acute upper respiratory infection 02/24/2007  . HODGKIN'S LYMPHOMA 11/06/2006  . Diabetes mellitus with circulatory complication ([place]) 43/15/4008  . Dyslipidemia 11/06/2006  . Essential hypertension 11/06/2006  . Congestive heart failure ([place]) 11/06/2006  . History of cardiovascular disorder 11/06/2006     Prior to Admission medications   Medication Sig Start Date End Date Taking? Authorizing Provider  amLODipine (NORVASC) 10 MG tablet TAKE 1 TABLET BY MOUTH ONCE DAILY 05/01/15  Yes Marletta Lor, MD  aspirin 325 MG tablet Take 325 mg by mouth daily.     Yes Historical Provider, MD  atorvastatin (LIPITOR) 10 MG tablet TAKE 1 TABLET BY MOUTH ONCE DAILY 10/17/15  Yes Marletta Lor, MD  B-D UF III MINI PEN NEEDLES 31G X 5 MM MISC USE TWICE A DAY 08/24/10  Yes Marletta Lor, MD  beclomethasone (QVAR) 80 MCG/ACT inhaler Inhale 2 puffs into the lungs 2 (two) times daily.  09/12/15  Yes Posey Boyer, MD  benzonatate (TESSALON) 100 MG capsule Take 1 capsule (100 mg total) by mouth 3 (three) times daily as needed for cough. 10/07/15  Yes Jaynee Eagles, PA-C  Blood Glucose  Monitoring Suppl (ONETOUCH VERIO IQ SYSTEM) w/Device KIT USE TO CHECK BLOOD SUGAR 10/12/15  Yes Marletta Lor, MD  clotrimazole (LOTRIMIN) 1 % cream Apply topically 2 (two) times daily. 12/14/12  Yes Marletta Lor, MD  Dulaglutide (TRULICITY) 1.5 PO/2.4MP SOPN Inject 1.5 mg into the skin once a week. 12/01/14  Yes Marletta Lor, MD  FARXIGA 10 MG TABS tablet TAKE 1 TABLET EVERY DAY 06/20/15  Yes Marletta Lor, MD  fexofenadine-pseudoephedrine (ALLEGRA-D) 60-120 MG per tablet Take 1 tablet by mouth daily as needed. For seasonal allergies   Yes Historical Provider, MD  fluticasone (FLONASE) 50 MCG/ACT nasal spray USE 2 SPRAYS IN EACH NOSTRIL ONCE DAILY 05/12/15  Yes Marletta Lor, MD  glimepiride (AMARYL) 4 MG tablet TAKE 1 TABLET BY MOUTH TWICE DAILY 06/14/15  Yes Marletta Lor, MD  glucose blood [facility] VERIO) test strip USE TO CHECK BLOOD SUGAR DAILY AND PRN 10/12/15  Yes Marletta Lor, MD  ketorolac (ACULAR) 0.4 % SOLN PLACE 1 DROP IN LEFT EYE TWICE A DAY 05/23/15  Yes Historical Provider, MD  KLOR-CON M20 20 MEQ tablet TAKE 1 TABLET BY MOUTH EVERY DAY 06/30/15  Yes Marletta Lor, MD  LORazepam (ATIVAN) 0.5 MG tablet TAKE 2 TABLETS TWICE A DAY AS NEEDED 05/24/15  Yes Marletta Lor, MD  losartan (COZAAR) 100 MG tablet Take 1 tablet (100 mg total) by mouth daily. 11/23/14  Yes Marletta Lor, MD  metFORMIN (GLUCOPHAGE) 1000 MG tablet TAKE 1 TABLET BY MOUTH TWICE A DAY 06/19/15  Yes Marin Olp, MD  metoprolol (LOPRESSOR) 50 MG tablet TAKE 1 TABLET BY MOUTH EVERY DAY 10/09/15  Yes Marletta Lor, MD  montelukast (SINGULAIR) 10 MG tablet Take 1 tablet (10 mg total) by mouth at bedtime. 10/07/15  Yes Jaynee Eagles, PA-C  Multiple Vitamins-Minerals (CENTRUM SILVER PO) Take 1 tablet by mouth daily.     Yes Historical Provider, MD  OMEGA 3 1200 MG CAPS Take 1,200 mg by mouth daily.   Yes Historical Provider, MD  ONE TOUCH LANCETS MISC USE TO CHECK  BLOOD SUGAR DAILY AND PRN 10/12/15  Yes Marletta Lor, MD  Polyethyl Glycol-Propyl Glycol (SYSTANE ULTRA OP) Place 1-2 drops into both eyes as needed.    Yes Historical Provider, MD  terazosin (HYTRIN) 5 MG capsule TAKE 1 CAPSULE EVERY DAY 05/16/15  Yes Marletta Lor, MD  Triamcinolone Acetonide (NASACORT AQ NA) Place 2 sprays into the nose as needed.    Yes Historical Provider, MD  VENTOLIN HFA 108 (90 Base) MCG/ACT inhaler INHALE 2 PUFFS INTO THE LUNGS EVERY 4 HOURS AS NEEDED FOR WHEEZING *NEEDS OFFICE VISY* 07/14/15  Yes Marletta Lor, MD  XIIDRA 5 % SOLN PLACE 1 DROP INTO EACH EYE TWICE A DAY 05/02/15  Yes Historical Provider, MD  Dextromethorphan-Guaifenesin (MUCINEX DM MAXIMUM STRENGTH) 60-1200 MG TB12 Take 1 tablet by mouth 2 (two) times daily. Reported on 10/20/2015    Historical Provider, MD     Allergies  Allergen Reactions  . Codeine Phosphate     Itching and gittery       Objective:  Physical Exam  Constitutional: He is oriented to person, place, and time. He appears well-developed and well-nourished. He is active and cooperative.  No distress.  BP 124/76 mmHg  Pulse 81  Temp(Src) 97.9 F (36.6 C) (Oral)  Resp 16  Ht 5' 6.5" (1.689 m)  Wt 156 lb (70.761 kg)  BMI 24.80 kg/m2  SpO2 95%  HENT:  Head: Normocephalic and atraumatic.  Right Ear: Hearing, tympanic membrane, external ear and ear canal normal.  Left Ear: Hearing, tympanic membrane, external ear and ear canal normal.  Nose: Nose normal. No mucosal edema or rhinorrhea.  Mouth/Throat: Uvula is midline and mucous membranes are normal. No oral lesions. No uvula swelling.  Eyes: Conjunctivae are normal. No scleral icterus.  Neck: Normal range of motion, full passive range of motion without pain and phonation normal. Neck supple. No thyromegaly present.  Cardiovascular: Normal rate, regular rhythm and normal heart sounds.   Pulses:      Radial pulses are 2+ on the right side, and 2+ on the left side.    Pulmonary/Chest: Effort normal. He has decreased breath sounds in the right lower field. He has no wheezes. He has no rhonchi. He has no rales.  He reports that his hematologist usually comments that the RIGHT lung breath sounds are not appreciated, but all his other providers don't mention it.  Lymphadenopathy:       Head (right side): No tonsillar, no preauricular, no posterior auricular and no occipital adenopathy present.       Head (left side): No tonsillar, no preauricular, no posterior auricular and no occipital adenopathy present.    He has no cervical adenopathy.       Right: No supraclavicular adenopathy present.       Left: No supraclavicular adenopathy present.  Neurological: He is alert and oriented to person, place, and time. No sensory deficit.  Skin: Skin is warm, dry and intact. No rash noted. No cyanosis or erythema. Nails show no clubbing.  Psychiatric: He has a normal mood and affect. His speech is normal and behavior is normal.    Results for orders placed or performed in visit on 10/20/15  POCT CBC  Result Value Ref Range   WBC 5.2 4.6 - 10.2 K/uL   Lymph, poc 1.2 0.6 - 3.4   POC LYMPH PERCENT 22.8 10 - 50 %L   MID (cbc) 0.5 0 - 0.9   POC MID % 10.3 0 - 12 %M   POC Granulocyte 3.5 2 - 6.9   Granulocyte percent 66.9 37 - 80 %G   RBC 4.60 (A) 4.69 - 6.13 M/uL   Hemoglobin 12.1 (A) 14.1 - 18.1 g/dL   HCT, POC 37.5 (A) 43.5 - 53.7 %   MCV 81.5 80 - 97 fL   MCH, POC 26.3 (A) 27 - 31.2 pg   MCHC 32.3 31.8 - 35.4 g/dL   RDW, POC 17.8 %   Platelet Count, POC 418 142 - 424 K/uL   MPV 5.9 0 - 99.8 fL  POCT glucose (manual entry)  Result Value Ref Range   POC Glucose 343 (A) 70 - 99 mg/dl    Dg Chest 2 View  10/20/2015  CLINICAL DATA:  Congestion, drainage and fatigue. Recently resolved cough. Decreased breath sounds on the right. EXAM: CHEST  2 VIEW COMPARISON:  Chest x-rays dated 09/12/2015 and 06/15/2014. FINDINGS: Small nodular density overlying the right lower  lung is compatible with nipple shadow. Lungs are otherwise clear. No evidence of pneumonia. No pleural effusion or pneumothorax seen. Lungs are hyperexpanded suggesting some degree of COPD/ emphysema. Heart size is normal. Overall cardiomediastinal silhouette is stable in  size and configuration. Mild degenerative change again noted within the thoracic spine. Osseous and soft tissue structures about the chest are otherwise unremarkable. IMPRESSION: 1. Hyperexpanded lungs indicating COPD/emphysema. 2. No evidence of acute cardiopulmonary abnormality. No evidence of pneumonia. Electronically Signed   By: Franki Cabot M.D.   On: 10/20/2015 17:38    EKG reviewed with Dr. Joseph Art. Essentially unchanged from 2013 tracing, but with bigeminal arrhythmia.      Assessment & Plan:   1. Decreased breath sounds CXR is unchanged-COPD but no acute abnormality. He reports that the diminished breath sounds are his baseline. - DG Chest 2 View; Future - POCT CBC  2. Diabetes mellitus due to underlying condition with diabetic peripheral angiopathy without gangrene, without long-term current use of insulin (HCC) Uncontrolled. Counseled on the importance on glucose control, and risks associated with lack of control. I suspect that his current fatigue is at least in part due to this issue and encouraged him to follow-up with his PCP to consider insulin therapy. - POCT glucose (manual entry)  3. Other fatigue EKG is unchanged from 2013, which is reassuring. Likely due to uncontrolled diabetes. Also recommend taking Singulair at HS and switching from cetirizine to loratadine or fexofenadine. - EKG 12-Lead  4. Indigestion Uncertain. Possibly due to lack of diabetes control. If this persists, re-evaluate here or with PCP.  5. Nasal congestion Continue Flonase, Singulair, and switch from cetirizine to loratadine or fexofenadine. - ipratropium (ATROVENT) 0.03 % nasal spray; Place 2 sprays into both nostrils 2 (two)  times daily.  Dispense: 30 mL; Refill: 0   Fara Chute, PA-C Physician Assistant-Certified [facility]

## 2015-10-25 ENCOUNTER — Other Ambulatory Visit: Payer: Self-pay | Admitting: Internal Medicine

## 2015-10-26 ENCOUNTER — Other Ambulatory Visit: Payer: Self-pay | Admitting: Internal Medicine

## 2015-11-15 ENCOUNTER — Other Ambulatory Visit: Payer: Self-pay | Admitting: Internal Medicine

## 2015-11-20 ENCOUNTER — Telehealth: Payer: Self-pay | Admitting: Internal Medicine

## 2015-11-20 NOTE — Telephone Encounter (Signed)
Only if you can put two appointments together.

## 2015-11-20 NOTE — Telephone Encounter (Signed)
Pt has to reschedule his cpe 6/23 due to a funeral in [place]. Next available 9/18. Pt would like to know if Dr Raliegh Ip can work him in any sooner than Sept?

## 2015-11-21 ENCOUNTER — Other Ambulatory Visit: Payer: Self-pay | Admitting: Internal Medicine

## 2015-11-24 ENCOUNTER — Encounter: Payer: Medicare Other | Admitting: Internal Medicine

## 2015-12-03 ENCOUNTER — Other Ambulatory Visit: Payer: Self-pay | Admitting: Internal Medicine

## 2015-12-08 ENCOUNTER — Encounter: Payer: Medicare Other | Admitting: Internal Medicine

## 2015-12-12 ENCOUNTER — Other Ambulatory Visit: Payer: Self-pay | Admitting: Physician Assistant

## 2015-12-14 NOTE — Telephone Encounter (Signed)
Chelle, do you want to give RFs? 

## 2015-12-14 NOTE — Telephone Encounter (Signed)
Meds ordered this encounter  Medications  . ipratropium (ATROVENT) 0.03 % nasal spray    Sig: PLACE 2 SPRAYS INTO BOTH NOSTRILS 2 TIMES DAILY    Dispense:  30 mL    Refill:  12

## 2015-12-22 NOTE — Telephone Encounter (Signed)
Pt has been scheduled.  °

## 2015-12-24 ENCOUNTER — Other Ambulatory Visit: Payer: Self-pay | Admitting: Internal Medicine

## 2015-12-25 NOTE — Telephone Encounter (Signed)
Rx refill sent to pharmacy. 

## 2015-12-26 ENCOUNTER — Other Ambulatory Visit: Payer: Self-pay | Admitting: Internal Medicine

## 2015-12-26 ENCOUNTER — Encounter: Payer: Self-pay | Admitting: Internal Medicine

## 2015-12-26 ENCOUNTER — Ambulatory Visit (INDEPENDENT_AMBULATORY_CARE_PROVIDER_SITE_OTHER): Payer: Medicare Other | Admitting: Internal Medicine

## 2015-12-26 VITALS — BP 128/60 | HR 95 | Temp 98.1°F | Ht 65.5 in | Wt 150.0 lb

## 2015-12-26 DIAGNOSIS — E0851 Diabetes mellitus due to underlying condition with diabetic peripheral angiopathy without gangrene: Secondary | ICD-10-CM

## 2015-12-26 DIAGNOSIS — I5022 Chronic systolic (congestive) heart failure: Secondary | ICD-10-CM | POA: Diagnosis not present

## 2015-12-26 DIAGNOSIS — R1013 Epigastric pain: Secondary | ICD-10-CM

## 2015-12-26 DIAGNOSIS — R634 Abnormal weight loss: Secondary | ICD-10-CM | POA: Diagnosis not present

## 2015-12-26 DIAGNOSIS — I10 Essential (primary) hypertension: Secondary | ICD-10-CM | POA: Diagnosis not present

## 2015-12-26 DIAGNOSIS — N400 Enlarged prostate without lower urinary tract symptoms: Secondary | ICD-10-CM

## 2015-12-26 DIAGNOSIS — E785 Hyperlipidemia, unspecified: Secondary | ICD-10-CM

## 2015-12-26 DIAGNOSIS — M545 Low back pain, unspecified: Secondary | ICD-10-CM

## 2015-12-26 LAB — T4, FREE: Free T4: 1.01 ng/dL (ref 0.60–1.60)

## 2015-12-26 LAB — COMPREHENSIVE METABOLIC PANEL
ALBUMIN: 3.6 g/dL (ref 3.5–5.2)
ALT: 12 U/L (ref 0–53)
AST: 15 U/L (ref 0–37)
Alkaline Phosphatase: 95 U/L (ref 39–117)
BUN: 16 mg/dL (ref 6–23)
CALCIUM: 9.6 mg/dL (ref 8.4–10.5)
CHLORIDE: 101 meq/L (ref 96–112)
CO2: 29 mEq/L (ref 19–32)
Creatinine, Ser: 0.89 mg/dL (ref 0.40–1.50)
GFR: 109.47 mL/min (ref >60.00)
Glucose, Bld: 200 mg/dL — ABNORMAL HIGH (ref 70–99)
POTASSIUM: 4.8 meq/L (ref 3.5–5.1)
SODIUM: 141 meq/L (ref 135–145)
Total Bilirubin: 1.2 mg/dL (ref 0.2–1.2)
Total Protein: 7.2 g/dL (ref 6.0–8.3)

## 2015-12-26 LAB — CBC WITH DIFFERENTIAL/PLATELET
BASOS ABS: 0 10*3/uL (ref 0.0–0.1)
Basophils Relative: 0.2 % (ref 0.0–3.0)
Eosinophils Absolute: 0.1 10*3/uL (ref 0.0–0.7)
Eosinophils Relative: 0.9 % (ref 0.0–5.0)
HEMATOCRIT: 34.2 % — AB (ref 39.0–52.0)
HEMOGLOBIN: 10.5 g/dL — AB (ref 13.0–17.0)
LYMPHS PCT: 16.8 % (ref 12.0–46.0)
Lymphs Abs: 1 10*3/uL (ref 0.7–4.0)
MCHC: 30.8 g/dL (ref 30.0–36.0)
MCV: 78.2 fl (ref 78.0–100.0)
MONOS PCT: 18.4 % — AB (ref 3.0–12.0)
Monocytes Absolute: 1.1 10*3/uL — ABNORMAL HIGH (ref 0.1–1.0)
NEUTROS PCT: 63.7 % (ref 43.0–77.0)
Neutro Abs: 3.7 10*3/uL (ref 1.4–7.7)
Platelets: 530 10*3/uL — ABNORMAL HIGH (ref 150.0–400.0)
RBC: 4.38 Mil/uL (ref 4.22–5.81)
RDW: 17.7 % — ABNORMAL HIGH (ref 11.5–15.5)
WBC: 5.8 10*3/uL (ref 4.0–10.5)

## 2015-12-26 LAB — TSH: TSH: 1.03 u[IU]/mL (ref 0.35–4.50)

## 2015-12-26 LAB — PSA: PSA: 2.22 ng/mL (ref 0.10–4.00)

## 2015-12-26 LAB — HEMOGLOBIN A1C: Hgb A1c MFr Bld: 7.9 % — ABNORMAL HIGH (ref 4.6–6.5)

## 2015-12-26 LAB — BRAIN NATRIURETIC PEPTIDE: Pro B Natriuretic peptide (BNP): 15 pg/mL (ref 0.0–100.0)

## 2015-12-26 LAB — SEDIMENTATION RATE

## 2015-12-26 MED ORDER — PREDNISONE 5 MG PO TABS: 10.0000 mg | ORAL_TABLET | Freq: Every day | ORAL | 0 refills | Status: DC

## 2015-12-26 NOTE — Progress Notes (Signed)
Pre visit review using our clinic review tool, if applicable. No additional management support is needed unless otherwise documented below in the visit note. 

## 2015-12-26 NOTE — Patient Instructions (Signed)
Return in one month for follow-up  Report any new or worsening symptoms

## 2015-12-26 NOTE — Progress Notes (Signed)
Subjective:    Patient ID: [name], male    DOB: Sep 04, 1950, 65 y.o.   MRN: [id number]  HPI  65 year old patient who is seen today for a preventive health examination.  He has remote history lymphoma and is followed annually by oncology. He has a history of type 2 diabetes and last hemoglobin A1c was poorly controlled.  Weight loss was encouraged.  He has been attempting to eat well to better control his diabetes, but he has been unsuccessful at weight loss until earlier this year.  He has had 15 pounds a documented weight loss.  He states his appetite is well preserved.  Associated symptoms include weakness, fatigue, cold intolerance.  He also describes some occasional night sweats that have been present for several months.  Laboratory studies were checked in March and revealed mild anemia I examination March 2017  He has remote history tobacco use but discontinued in 2006 Colonoscopy performed 2015 He had a chest x-ray performed earlier this year  Past Medical History:  Diagnosis Date  . Arthritis    joints,   . CVA (cerebral vascular accident) ([place]) 2005   hx of right brain subcortical stroke  . Diabetes mellitus type II   . H/O hiatal hernia   . Hodgkin's lymphoma ([place]) 2000   stage IV B status post ABCD chemotherapy -2000  . Hyperlipidemia   . Hypertension    Dr. Vidal Schwalbe cardiology  . 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 Pneumonia Feb 2008  . Radiculopathy    S1  . Sinus congestion 06/19/11   pt has had sinus "infection" and just finished a z-pak  dr Anderson Malta aware     Social History   Social History  . Marital status: Married    Spouse name: N/A  . Number of children: 2  . Years of education: N/A   Occupational History  . 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 Smoking status: Former Smoker    Years: 30.00    Quit date: 06/03/2004  .  Smokeless tobacco: Never Used  . Alcohol use No     Comment: rarely  . Drug use: No  . Sexual activity: Not on file   Other Topics Concern  . Not on file   Social History Narrative   Lives with his wife.   Their children are adults and live in [place].    Past Surgical History:  Procedure Laterality Date  . 2-d echocardiogram  April 2002   ejection fraction 55 to 65%  . CATARACT EXTRACTION W/PHACO  06/19/2011   Procedure: CATARACT EXTRACTION PHACO AND INTRAOCULAR LENS PLACEMENT (IOC);  Surgeon: Adonis Brook, MD;  Location: [facility];  Service: Ophthalmology;  Laterality: Right;  [place] FMA50BM +18.00 D Right  . COLONOSCOPY  2003  . lymph node biospy  2000    Family History  Problem Relation Age of Onset  . Diabetes Mother   . Heart disease Mother   . Heart failure Father   . Anesthesia problems Neg Hx   . Cancer Sister   . Cancer Brother     Allergies  Allergen Reactions  . Codeine Phosphate     Itching and gittery    Current Outpatient Prescriptions on File Prior to Visit  Medication Sig Dispense Refill  . amLODipine (NORVASC) 10 MG tablet TAKE 1 TABLET BY MOUTH ONCE DAILY 90 tablet 1  .  aspirin 325 MG tablet Take 325 mg by mouth daily.      Marland Kitchen atorvastatin (LIPITOR) 10 MG tablet TAKE 1 TABLET BY MOUTH ONCE DAILY 90 tablet 1  . B-D UF III MINI PEN NEEDLES 31G X 5 MM MISC USE TWICE A DAY 100 each 5  . beclomethasone (QVAR) 80 MCG/ACT inhaler Inhale 2 puffs into the lungs 2 (two) times daily. 1 Inhaler 1  . benzonatate (TESSALON) 100 MG capsule Take 1 capsule (100 mg total) by mouth 3 (three) times daily as needed for cough. 30 capsule 3  . Blood Glucose Monitoring Suppl (ONETOUCH VERIO IQ SYSTEM) w/Device KIT USE TO CHECK BLOOD SUGAR 1 kit 0  . clotrimazole (LOTRIMIN) 1 % cream Apply topically 2 (two) times daily. 30 g 0  . Dextromethorphan-Guaifenesin (MUCINEX DM MAXIMUM STRENGTH) 60-1200 MG TB12 Take 1 tablet by mouth 2 (two) times daily. Reported on 10/20/2015      . FARXIGA 10 MG TABS tablet TAKE 1 TABLET EVERY DAY 90 tablet 1  . fexofenadine-pseudoephedrine (ALLEGRA-D) 60-120 MG per tablet Take 1 tablet by mouth daily as needed. For seasonal allergies    . fluticasone (FLONASE) 50 MCG/ACT nasal spray USE 2 SPRAYS IN EACH NOSTRIL ONCE DAILY 16 g 5  . fosinopril (MONOPRIL) 20 MG tablet TAKE 1 TABLET BY MOUTH TWICE DAILY 180 tablet 1  . glimepiride (AMARYL) 4 MG tablet TAKE 1 TABLET BY MOUTH TWICE DAILY 180 tablet 1  . glucose blood (ONETOUCH VERIO) test strip USE TO CHECK BLOOD SUGAR DAILY AND PRN 100 each 12  . ipratropium (ATROVENT) 0.03 % nasal spray PLACE 2 SPRAYS INTO BOTH NOSTRILS 2 TIMES DAILY 30 mL 12  . ketorolac (ACULAR) 0.4 % SOLN PLACE 1 DROP IN LEFT EYE TWICE A DAY  3  . KLOR-CON M20 20 MEQ tablet TAKE 1 TABLET BY MOUTH EVERY DAY 90 tablet 1  . LORazepam (ATIVAN) 0.5 MG tablet TAKE 2 TABLETS TWICE A DAY AS NEEDED 60 tablet 2  . losartan (COZAAR) 100 MG tablet TAKE 1 TABLET BY MOUTH ONCE DAILY 90 tablet 3  . metFORMIN (GLUCOPHAGE) 1000 MG tablet TAKE 1 TABLET BY MOUTH TWICE A DAY 180 tablet 4  . metoprolol (LOPRESSOR) 50 MG tablet TAKE 1 TABLET BY MOUTH EVERY DAY 90 tablet 1  . montelukast (SINGULAIR) 10 MG tablet Take 1 tablet (10 mg total) by mouth at bedtime. 30 tablet 3  . Multiple Vitamins-Minerals (CENTRUM SILVER PO) Take 1 tablet by mouth daily.      . OMEGA 3 1200 MG CAPS Take 1,200 mg by mouth daily.    . ONE TOUCH LANCETS MISC USE TO CHECK BLOOD SUGAR DAILY AND PRN 100 each 12  . Polyethyl Glycol-Propyl Glycol (SYSTANE ULTRA OP) Place 1-2 drops into both eyes as needed.     . terazosin (HYTRIN) 5 MG capsule TAKE 1 CAPSULE EVERY DAY 90 capsule 3  . Triamcinolone Acetonide (NASACORT AQ NA) Place 2 sprays into the nose as needed.     . TRULICITY 1.5 VZ/5.6LO SOPN INJECT 1.5 MG INTO THE SKIN ONCE A WEEK. 4 pen 5  . VENTOLIN HFA 108 (90 Base) MCG/ACT inhaler INHALE 2 PUFFS INTO THE LUNGS EVERY 4 HOURS AS NEEDED FOR WHEEZING 18 Inhaler 2   . XIIDRA 5 % SOLN PLACE 1 DROP INTO EACH EYE TWICE A DAY  6   No current facility-administered medications on file prior to visit.     BP 128/60   Pulse 95   Temp 98.1 F (  36.7 C) (Oral)   Ht 5' 5.5" (1.664 m)   Wt 150 lb (68 kg)   SpO2 95%   BMI 24.58 kg/m       1. Risk factors, based on past  M,S,F history-cardiovascular risk factors include diabetes, hypertension, and dyslipidemia.  He does have a history of congestive heart failure.  2.  Physical activities: No major restrictions, although has been slowed down.  More recently, has had worsening fatigue 3.  Depression/mood: History depression or mood disorder  4.  Hearing: No significant deficits  5.  ADL's: Totally independent  6.  Fall risk: Low  7.  Home safety: No problems identified  8.  Height weight, and visual acuity;Marland Kitchen  There is been a 15 pound weight loss this year.  Eye examination performed in March 2017  9.  Counseling:Regular exercise and heart healthy diet.  Encouraged  10. Lab orders based on risk factors: Laboratory profile reviewed including lipid panel and urine for microalbumin  11. Referral : We'll continue annual eye examinations.  Patient had follow-up colonoscopy 2015  12. Care plan: Heart healthy diet.  Encouraged.  Modest weight loss and exercise encouraged.  13. Cognitive assessment: Alert and alert with normal affect.  No cognitive dysfunction  14.  Preventive services will include annual clinical exam was screening lab.  He will have his annual eye examinations performed. Patient was provided with a written and personalized care plan  15.  Provider list includes primary care ophthalmology and gastroenterology, As well as oncology and pulmonary medicine    Review of Systems  Constitutional: Positive for activity change, appetite change, chills and fatigue. Negative for fever.  HENT: Negative for congestion, dental problem, ear pain, hearing loss, mouth sores,  rhinorrhea, sinus pressure, sneezing, tinnitus, trouble swallowing and voice change.   Eyes: Negative for photophobia, pain, redness and visual disturbance.  Respiratory: Negative for apnea, cough, choking, chest tightness, shortness of breath and wheezing.   Cardiovascular: Negative for chest pain, palpitations and leg swelling.  Gastrointestinal: Negative for abdominal distention, abdominal pain, anal bleeding, blood in stool, constipation, diarrhea, nausea, rectal pain and vomiting.  Genitourinary: Negative for decreased urine volume, difficulty urinating, discharge, dysuria, flank pain, frequency, genital sores, hematuria, penile swelling, scrotal swelling, testicular pain and urgency.  Musculoskeletal: Negative for arthralgias, back pain, gait problem, joint swelling, myalgias, neck pain and neck stiffness.  Skin: Negative for color change, rash and wound.  Neurological: Positive for weakness. Negative for dizziness, tremors, seizures, syncope, facial asymmetry, speech difficulty, light-headedness, numbness and headaches.  Hematological: Negative for adenopathy. Does not bruise/bleed easily.  Psychiatric/Behavioral: Negative for agitation, behavioral problems, confusion, decreased concentration, dysphoric mood, hallucinations, self-injury, sleep disturbance and suicidal ideas. The patient is not nervous/anxious.        Objective:   Physical Exam  Constitutional: He appears well-developed and well-nourished.  HENT:  Head: Normocephalic and atraumatic.  Right Ear: External ear normal.  Left Ear: External ear normal.  Nose: Nose normal.  Mouth/Throat: Oropharynx is clear and moist.  Eyes: Conjunctivae and EOM are normal. Pupils are equal, round, and reactive to light. No scleral icterus.  Neck: Normal range of motion. Neck supple. No JVD present. No thyromegaly present.  Cardiovascular: Regular rhythm, normal heart sounds and intact distal pulses.  Exam reveals no gallop and no friction  rub.   No murmur heard. Resting pulse rate 100  Decreased left dorsalis pedis pulse  Pulmonary/Chest: Effort normal. He exhibits no tenderness.  A few crackles at the bases with diminished  breath sounds Breath sounds diminished, more on the right compared to the left  Abdominal: Soft. Bowel sounds are normal. He exhibits no distension and no mass. There is no tenderness.  Genitourinary: Penis normal. Rectal exam shows guaiac negative stool.  Genitourinary Comments: Symmetrically enlarged  Musculoskeletal: Normal range of motion. He exhibits no edema or tenderness.  Lymphadenopathy:    He has no cervical adenopathy.  Neurological: He is alert. He has normal reflexes. No cranial nerve deficit. Coordination normal.  Skin: Skin is warm and dry. No rash noted.  Psychiatric: He has a normal mood and affect. His behavior is normal.          Assessment & Plan:  Preventive health examination Weight loss.  We'll review screening lab including sedimentation rate.  May need imaging studies History of lymphoma Diabetes mellitus.  Will check hemoglobin A1c Hypertension, stable Dyslipidemia.  Continue statin therapy  Recheck 4 weeks  Nyoka Cowden, MD

## 2016-01-03 ENCOUNTER — Other Ambulatory Visit: Payer: Medicare Other

## 2016-01-03 ENCOUNTER — Inpatient Hospital Stay: Admission: RE | Admit: 2016-01-03 | Payer: Medicare Other | Source: Ambulatory Visit

## 2016-01-08 ENCOUNTER — Other Ambulatory Visit: Payer: Self-pay | Admitting: Internal Medicine

## 2016-01-09 NOTE — Telephone Encounter (Signed)
Okay to refill? 

## 2016-01-15 ENCOUNTER — Encounter: Payer: Self-pay | Admitting: Internal Medicine

## 2016-01-15 ENCOUNTER — Ambulatory Visit (INDEPENDENT_AMBULATORY_CARE_PROVIDER_SITE_OTHER): Payer: Medicare Other | Admitting: Internal Medicine

## 2016-01-15 ENCOUNTER — Ambulatory Visit
Admission: RE | Admit: 2016-01-15 | Discharge: 2016-01-15 | Disposition: A | Discharge status: Home / Self Care | Payer: Medicare Other | Source: Ambulatory Visit | Attending: Internal Medicine | Admitting: Internal Medicine

## 2016-01-15 VITALS — BP 110/74 | HR 85 | Temp 97.9°F | Ht 65.5 in | Wt 147.0 lb

## 2016-01-15 DIAGNOSIS — E0851 Diabetes mellitus due to underlying condition with diabetic peripheral angiopathy without gangrene: Secondary | ICD-10-CM

## 2016-01-15 DIAGNOSIS — M545 Low back pain, unspecified: Secondary | ICD-10-CM

## 2016-01-15 DIAGNOSIS — R1013 Epigastric pain: Secondary | ICD-10-CM

## 2016-01-15 DIAGNOSIS — R634 Abnormal weight loss: Secondary | ICD-10-CM

## 2016-01-15 DIAGNOSIS — K429 Umbilical hernia without obstruction or gangrene: Secondary | ICD-10-CM | POA: Diagnosis not present

## 2016-01-15 LAB — GLUCOSE, POCT (MANUAL RESULT ENTRY): POC GLUCOSE: 351 mg/dL — AB (ref 70–99)

## 2016-01-15 MED ORDER — INSULIN PEN NEEDLE 31G X 5 MM MISC: 5 refills | Status: DC

## 2016-01-15 MED ORDER — INSULIN GLARGINE 300 UNIT/ML ~~LOC~~ SOPN: 14.0000 [IU] | PEN_INJECTOR | Freq: Every day | SUBCUTANEOUS | 3 refills | Status: DC

## 2016-01-15 MED ORDER — IOPAMIDOL (ISOVUE-M 300) INJECTION 61%
15.0000 mL | Freq: Once | INTRAMUSCULAR | Status: AC | PRN
  Administered 2016-01-15: 15 mL via INTRATHECAL

## 2016-01-15 NOTE — Progress Notes (Signed)
   Subjective:    Patient ID: [name], male    DOB: Nov 27, 1950, 65 y.o.   MRN: [id number]  HPI  Wt Readings from Last 3 Encounters:  01/15/16 147 lb (66.7 kg)  12/26/15 150 lb (68 kg)  10/20/15 156 lb (70.8 kg)    Review of Systems    see above Objective:   Physical Exam   See above     Assessment & Plan:  See above

## 2016-01-15 NOTE — Patient Instructions (Signed)
Discontinue Amaryl (glimepiride)  Start insulin 14 units at bedtime.  Check fasting blood sugar every morning prior to eating breakfast; increase insulin by 4 units every 3-4 days until fasting blood sugar less than 200.  Return in 2 weeks for follow-up

## 2016-01-15 NOTE — Progress Notes (Signed)
Pre visit review using our clinic review tool, if applicable. No additional management support is needed unless otherwise documented below in the visit note. 

## 2016-01-15 NOTE — Progress Notes (Signed)
Subjective:    Patient ID: [name], male    DOB: August 06, 1950, 65 y.o.   MRN: [id number]  HPI 65 year old patient who has a history of diabetes.  He is seen today for follow-up of weight loss. Laboratory studies were checked 3 weeks ago and revealed mild anemia as well as a sedimentation rate of 130.  In spite of some significant weight loss,  Hemoglobin A1c was only modestly improved at 7.9. He also complains of some fatigue, poor appetite and night sweats.  He has remote history of Hodgkin's lymphoma He also complains of frequent urination.  He states that a fasting blood sugar was 150.  Earlier today.  Random blood sugar, presently 351. Patient has an abdominal and pelvic CT scan scheduled later today.  A chest x-ray earlier in the year.  Unremarkable The patient was given a brief trial of prednisone 10 mg daily for possible PMR due to weight loss, fatigue and elevated sedimentation rate.  The patient has not given a history of muscle stiffness or pain, and this medication was discontinued after a brief trial when he noted no clinical improvement.  There has been no worsening of his symptoms following discontinuation of prednisone  Past Medical History:  Diagnosis Date  . Arthritis    joints,   . CVA (cerebral vascular accident) ([place]) 2005   hx of right brain subcortical stroke  . Diabetes mellitus type II   . H/O hiatal hernia   . Hodgkin's lymphoma ([place]) 2000   stage IV B status post ABCD chemotherapy -2000  . Hyperlipidemia   . Hypertension    Dr. Vidal Schwalbe cardiology  . 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 Pneumonia Feb 2008  . Radiculopathy    S1  . Sinus congestion 06/19/11   pt has had sinus "infection" and just finished a z-pak  dr Anderson Malta aware     Social History   Social History  . Marital status: Married    Spouse name: N/A  . Number of children: 2  . Years of education: N/A   Occupational History  .  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 Smoking status: Former Smoker    Years: 30.00    Quit date: 06/03/2004  . Smokeless tobacco: Never Used  . Alcohol use No     Comment: rarely  . Drug use: No  . Sexual activity: Not on file   Other Topics Concern  . Not on file   Social History Narrative   Lives with his wife.   Their children are adults and live in [place].    Past Surgical History:  Procedure Laterality Date  . 2-d echocardiogram  April 2002   ejection fraction 55 to 65%  . CATARACT EXTRACTION W/PHACO  06/19/2011   Procedure: CATARACT EXTRACTION PHACO AND INTRAOCULAR LENS PLACEMENT (IOC);  Surgeon: Adonis Brook, MD;  Location: [facility];  Service: Ophthalmology;  Laterality: Right;  [place] FMA50BM +18.00 D Right  . COLONOSCOPY  2003  . lymph node biospy  2000    Family History  Problem Relation Age of Onset  . Diabetes Mother   . Heart disease Mother   . Heart failure Father   . Anesthesia problems Neg Hx   . Cancer Sister   . Cancer Brother     Allergies  Allergen Reactions  . Codeine Phosphate     Itching and  gittery    Current Outpatient Prescriptions on File Prior to Visit  Medication Sig Dispense Refill  . amLODipine (NORVASC) 10 MG tablet TAKE 1 TABLET BY MOUTH ONCE DAILY 90 tablet 1  . aspirin 325 MG tablet Take 325 mg by mouth daily.      Marland Kitchen atorvastatin (LIPITOR) 10 MG tablet TAKE 1 TABLET BY MOUTH ONCE DAILY 90 tablet 1  . beclomethasone (QVAR) 80 MCG/ACT inhaler Inhale 2 puffs into the lungs 2 (two) times daily. 1 Inhaler 1  . benzonatate (TESSALON) 100 MG capsule Take 1 capsule (100 mg total) by mouth 3 (three) times daily as needed for cough. 30 capsule 3  . Blood Glucose Monitoring Suppl (ONETOUCH VERIO IQ SYSTEM) w/Device KIT USE TO CHECK BLOOD SUGAR 1 kit 0  . Cholecalciferol (VITAMIN D3 PO) Take by mouth.    . clotrimazole (LOTRIMIN) 1 % cream Apply topically 2 (two)  times daily. 30 g 0  . Dextromethorphan-Guaifenesin (MUCINEX DM MAXIMUM STRENGTH) 60-1200 MG TB12 Take 1 tablet by mouth 2 (two) times daily. Reported on 10/20/2015    . FARXIGA 10 MG TABS tablet TAKE 1 TABLET EVERY DAY 90 tablet 1  . fexofenadine-pseudoephedrine (ALLEGRA-D) 60-120 MG per tablet Take 1 tablet by mouth daily as needed. For seasonal allergies    . fluticasone (FLONASE) 50 MCG/ACT nasal spray USE 2 SPRAYS IN EACH NOSTRIL ONCE DAILY 16 g 5  . glucose blood (ONETOUCH VERIO) test strip USE TO CHECK BLOOD SUGAR DAILY AND PRN 100 each 12  . ipratropium (ATROVENT) 0.03 % nasal spray PLACE 2 SPRAYS INTO BOTH NOSTRILS 2 TIMES DAILY 30 mL 12  . ketorolac (ACULAR) 0.4 % SOLN PLACE 1 DROP IN LEFT EYE TWICE A DAY  3  . KLOR-CON M20 20 MEQ tablet TAKE 1 TABLET BY MOUTH EVERY DAY 90 tablet 1  . LORazepam (ATIVAN) 0.5 MG tablet TAKE 2 TABLETS TWICE A DAY AS NEEDED 60 tablet 0  . losartan (COZAAR) 100 MG tablet TAKE 1 TABLET BY MOUTH ONCE DAILY 90 tablet 3  . metFORMIN (GLUCOPHAGE) 1000 MG tablet TAKE 1 TABLET BY MOUTH TWICE A DAY 180 tablet 4  . metoprolol (LOPRESSOR) 50 MG tablet TAKE 1 TABLET BY MOUTH EVERY DAY 90 tablet 1  . montelukast (SINGULAIR) 10 MG tablet Take 1 tablet (10 mg total) by mouth at bedtime. 30 tablet 3  . Multiple Vitamins-Minerals (CENTRUM SILVER PO) Take 1 tablet by mouth daily.      . OMEGA 3 1200 MG CAPS Take 1,200 mg by mouth daily.    . ONE TOUCH LANCETS MISC USE TO CHECK BLOOD SUGAR DAILY AND PRN 100 each 12  . Polyethyl Glycol-Propyl Glycol (SYSTANE ULTRA OP) Place 1-2 drops into both eyes as needed.     . terazosin (HYTRIN) 5 MG capsule TAKE 1 CAPSULE EVERY DAY 90 capsule 3  . Triamcinolone Acetonide (NASACORT AQ NA) Place 2 sprays into the nose as needed.     . TRULICITY 1.5 MG/0.5ML SOPN INJECT 1.5 MG INTO THE SKIN ONCE A WEEK. 4 pen 5  . VENTOLIN HFA 108 (90 Base) MCG/ACT inhaler INHALE 2 PUFFS INTO THE LUNGS EVERY 4 HOURS AS NEEDED FOR WHEEZING 18 Inhaler 2  .  XIIDRA 5 % SOLN PLACE 1 DROP INTO EACH EYE TWICE A DAY  6   No current facility-administered medications on file prior to visit.     BP 110/74 (BP Location: Left Arm, Patient Position: Sitting, Cuff Size: Normal)   Pulse 85   Temp  97.9 F (36.6 C) (Oral)   Ht 5' 5.5" (1.664 m)   Wt 147 lb (66.7 kg)   SpO2 95%   BMI 24.09 kg/m    Review of Systems  Constitutional: Positive for activity change, appetite change, diaphoresis, fatigue and unexpected weight change. Negative for fever.  Endocrine: Positive for polyuria.       Objective:   Physical Exam  Constitutional: He is oriented to person, place, and time. He appears well-developed.  HENT:  Head: Normocephalic.  Right Ear: External ear normal.  Left Ear: External ear normal.  Eyes: Conjunctivae and EOM are normal.  Neck: Normal range of motion.  No lymphadenopathy  Cardiovascular: Normal rate and normal heart sounds.   Pulmonary/Chest: Breath sounds normal.  Abdominal: Soft. Bowel sounds are normal. He exhibits no distension. There is no tenderness. There is no rebound and no guarding.  Musculoskeletal: Normal range of motion. He exhibits no edema or tenderness.  Neurological: He is alert and oriented to person, place, and time.  Psychiatric: He has a normal mood and affect. His behavior is normal.          Assessment & Plan:   Profound weight loss associated with fatigue, anorexia and elevated sedimentation rate.  No response to brief trial of prednisone  Diabetes mellitus.  Random blood sugar 351.  Will place on insulin therapy.  Uncontrolled diabetes certainly could be a cause of weight loss.  We'll start with 14 units of [place] and titrate by 4 units every 4 days until fasting blood sugars consistently less than 150. We'll review abdominal pelvic CT scan scheduled later today Recheck 4 weeks  Nyoka Cowden, MD

## 2016-01-22 ENCOUNTER — Other Ambulatory Visit: Payer: Medicare Other

## 2016-01-22 ENCOUNTER — Telehealth: Payer: Self-pay | Admitting: Internal Medicine

## 2016-01-22 MED ORDER — INSULIN PEN NEEDLE 31G X 5 MM MISC: 5 refills | Status: DC

## 2016-01-22 NOTE — Telephone Encounter (Signed)
Rx sent in

## 2016-01-22 NOTE — Telephone Encounter (Signed)
Pharmacy did not receive pt's RX Insulin Pen Needle (B-D UF III MINI PEN NEEDLES) 31G X 5 MM MISC  Can you resend to  [facility] [address]

## 2016-01-29 ENCOUNTER — Other Ambulatory Visit: Payer: Self-pay | Admitting: Urgent Care

## 2016-01-30 ENCOUNTER — Ambulatory Visit (INDEPENDENT_AMBULATORY_CARE_PROVIDER_SITE_OTHER): Payer: Medicare Other | Admitting: Internal Medicine

## 2016-01-30 ENCOUNTER — Encounter: Payer: Self-pay | Admitting: Internal Medicine

## 2016-01-30 VITALS — BP 110/62 | HR 98 | Temp 98.1°F | Resp 20 | Ht 65.5 in | Wt 146.0 lb

## 2016-01-30 DIAGNOSIS — D649 Anemia, unspecified: Secondary | ICD-10-CM | POA: Insufficient documentation

## 2016-01-30 DIAGNOSIS — D638 Anemia in other chronic diseases classified elsewhere: Secondary | ICD-10-CM

## 2016-01-30 DIAGNOSIS — E1359 Other specified diabetes mellitus with other circulatory complications: Secondary | ICD-10-CM

## 2016-01-30 DIAGNOSIS — Z794 Long term (current) use of insulin: Secondary | ICD-10-CM | POA: Diagnosis not present

## 2016-01-30 DIAGNOSIS — I10 Essential (primary) hypertension: Secondary | ICD-10-CM

## 2016-01-30 LAB — CBC WITH DIFFERENTIAL/PLATELET
BASOS ABS: 0 10*3/uL (ref 0.0–0.1)
Basophils Relative: 0.3 % (ref 0.0–3.0)
EOS ABS: 0.1 10*3/uL (ref 0.0–0.7)
Eosinophils Relative: 1.5 % (ref 0.0–5.0)
HEMATOCRIT: 33.3 % — AB (ref 39.0–52.0)
HEMOGLOBIN: 10.4 g/dL — AB (ref 13.0–17.0)
LYMPHS PCT: 17.4 % (ref 12.0–46.0)
Lymphs Abs: 1 10*3/uL (ref 0.7–4.0)
MCHC: 31.4 g/dL (ref 30.0–36.0)
MCV: 76.4 fl — AB (ref 78.0–100.0)
MONOS PCT: 15.9 % — AB (ref 3.0–12.0)
Monocytes Absolute: 1 10*3/uL (ref 0.1–1.0)
NEUTROS ABS: 3.9 10*3/uL (ref 1.4–7.7)
Neutrophils Relative %: 64.9 % (ref 43.0–77.0)
PLATELETS: 419 10*3/uL — AB (ref 150.0–400.0)
RBC: 4.35 Mil/uL (ref 4.22–5.81)
RDW: 17.1 % — ABNORMAL HIGH (ref 11.5–15.5)
WBC: 6 10*3/uL (ref 4.0–10.5)

## 2016-01-30 LAB — SEDIMENTATION RATE: Sed Rate: 95 mm/hr — ABNORMAL HIGH (ref 0–20)

## 2016-01-30 MED ORDER — INSULIN GLARGINE 300 UNIT/ML ~~LOC~~ SOPN: 30.0000 [IU] | PEN_INJECTOR | Freq: Every day | SUBCUTANEOUS | 3 refills | Status: AC

## 2016-01-30 NOTE — Progress Notes (Signed)
Subjective:    Patient ID: [name], male    DOB: December 11, 1950, 65 y.o.   MRN: [id number]  HPI  BP Readings from Last 3 Encounters:  01/30/16 110/62  01/15/16 110/74  12/26/15 128/60   Wt Readings from Last 3 Encounters:  01/30/16 146 lb (66.2 kg)  01/15/16 147 lb (66.7 kg)  12/26/15 150 lb (52 kg)   65 year old patient who is seen today for follow-up of uncontrolled diabetes associated with weight loss.  He has recently been placed on basal insulin and has uptitrated to 26 units at bedtime.  Blood sugars are much improved and now range from 150-184.  He continues to have anorexia, malaise and occasional night sweats.  He generally feels moderately improved. Much improved polyuria  Recent laboratory studies revealed anemia and elevated sedimentation rate.  Due to the anemia and constitutional complaints in the setting of a elevated sedimentation rate was given a brief trial of oral prednisone for possible PMR.  This resulted in no clinical improvement and was discontinued.  A fairly recent chest x-ray was unremarkable.  An abdominal pelvic CT scan was also nonrevealing.  The patient does have a remote history of Hodgkin's disease.  Past Medical History:  Diagnosis Date  . Arthritis    joints,   . CVA (cerebral vascular accident) (HCC) 2005   hx of right brain subcortical stroke  . Diabetes mellitus type II   . H/O hiatal hernia   . Hodgkin's lymphoma (HCC) 2000   stage IV B status post ABCD chemotherapy -2000  . Hyperlipidemia   . Hypertension    Dr. Maylon Cos cardiology  . 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 Pneumonia Feb 2008  . Radiculopathy    S1  . Sinus congestion 06/19/11   pt has had sinus "infection" and just finished a z-pak  dr Clarisa Kindred aware     Social History   Social History  . Marital status: Married    Spouse name: N/A  . Number of children: 2  . Years of education: N/A   Occupational History  .  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 Smoking status: Former Smoker    Years: 30.00    Quit date: 06/03/2004  . Smokeless tobacco: Never Used  . Alcohol use No     Comment: rarely  . Drug use: No  . Sexual activity: Not on file   Other Topics Concern  . Not on file   Social History Narrative   Lives with his wife.   Their children are adults and live in [place].    Past Surgical History:  Procedure Laterality Date  . 2-d echocardiogram  April 2002   ejection fraction 55 to 65%  . CATARACT EXTRACTION W/PHACO  06/19/2011   Procedure: CATARACT EXTRACTION PHACO AND INTRAOCULAR LENS PLACEMENT (IOC);  Surgeon: Shade Flood, MD;  Location: [facility] OR;  Service: Ophthalmology;  Laterality: Right;  [place] FMA50BM +18.00 D Right  . COLONOSCOPY  2003  . lymph node biospy  2000    Family History  Problem Relation Age of Onset  . Diabetes Mother   . Heart disease Mother   . Heart failure Father   . Anesthesia problems Neg Hx   . Cancer Sister   . Cancer Brother     Allergies  Allergen Reactions  . Codeine Phosphate     Itching and gittery  Current Outpatient Prescriptions on File Prior to Visit  Medication Sig Dispense Refill  . aspirin 325 MG tablet Take 325 mg by mouth daily.      Marland Kitchen atorvastatin (LIPITOR) 10 MG tablet TAKE 1 TABLET BY MOUTH ONCE DAILY 90 tablet 1  . benzonatate (TESSALON) 100 MG capsule Take 1 capsule (100 mg total) by mouth 3 (three) times daily as needed for cough. 30 capsule 3  . Blood Glucose Monitoring Suppl (ONETOUCH VERIO IQ SYSTEM) w/Device KIT USE TO CHECK BLOOD SUGAR 1 kit 0  . Cholecalciferol (VITAMIN D3 PO) Take 1,000 Units by mouth 2 (two) times daily.     . clotrimazole (LOTRIMIN) 1 % cream Apply topically 2 (two) times daily. 30 g 0  . Dextromethorphan-Guaifenesin (MUCINEX DM MAXIMUM STRENGTH) 60-1200 MG TB12 Take 1 tablet by mouth 2 (two) times daily. Reported on  10/20/2015    . fexofenadine-pseudoephedrine (ALLEGRA-D) 60-120 MG per tablet Take 1 tablet by mouth daily as needed. For seasonal allergies    . fluticasone (FLONASE) 50 MCG/ACT nasal spray USE 2 SPRAYS IN EACH NOSTRIL ONCE DAILY 16 g 5  . glucose blood (ONETOUCH VERIO) test strip USE TO CHECK BLOOD SUGAR DAILY AND PRN 100 each 12  . Insulin Pen Needle (B-D UF III MINI PEN NEEDLES) 31G X 5 MM MISC USE TWICE A DAY 100 each 5  . ipratropium (ATROVENT) 0.03 % nasal spray PLACE 2 SPRAYS INTO BOTH NOSTRILS 2 TIMES DAILY 30 mL 12  . KLOR-CON M20 20 MEQ tablet TAKE 1 TABLET BY MOUTH EVERY DAY 90 tablet 1  . LORazepam (ATIVAN) 0.5 MG tablet TAKE 2 TABLETS TWICE A DAY AS NEEDED 60 tablet 0  . losartan (COZAAR) 100 MG tablet TAKE 1 TABLET BY MOUTH ONCE DAILY 90 tablet 3  . metFORMIN (GLUCOPHAGE) 1000 MG tablet TAKE 1 TABLET BY MOUTH TWICE A DAY 180 tablet 4  . metoprolol (LOPRESSOR) 50 MG tablet TAKE 1 TABLET BY MOUTH EVERY DAY 90 tablet 1  . montelukast (SINGULAIR) 10 MG tablet Take 1 tablet (10 mg total) by mouth at bedtime. 30 tablet 3  . Multiple Vitamins-Minerals (CENTRUM SILVER PO) Take 1 tablet by mouth daily.      . OMEGA 3 1200 MG CAPS Take 1,200 mg by mouth daily.    . ONE TOUCH LANCETS MISC USE TO CHECK BLOOD SUGAR DAILY AND PRN 100 each 12  . Polyethyl Glycol-Propyl Glycol (SYSTANE ULTRA OP) Place 1-2 drops into both eyes as needed.     . terazosin (HYTRIN) 5 MG capsule TAKE 1 CAPSULE EVERY DAY 90 capsule 3  . Triamcinolone Acetonide (NASACORT AQ NA) Place 2 sprays into the nose as needed.     . TRULICITY 1.5 KT/6.2BW SOPN INJECT 1.5 MG INTO THE SKIN ONCE A WEEK. 4 pen 5  . VENTOLIN HFA 108 (90 Base) MCG/ACT inhaler INHALE 2 PUFFS INTO THE LUNGS EVERY 4 HOURS AS NEEDED FOR WHEEZING 18 Inhaler 2  . XIIDRA 5 % SOLN PLACE 1 DROP INTO EACH EYE TWICE A DAY  6   No current facility-administered medications on file prior to visit.     BP 110/62 (BP Location: Left Arm, Patient Position: Sitting,  Cuff Size: Normal)   Pulse 98   Temp 98.1 F (36.7 C) (Oral)   Resp 20   Ht 5' 5.5" (1.664 m)   Wt 146 lb (66.2 kg)   SpO2 95%   BMI 23.93 kg/m    Review of Systems  Constitutional: Positive for activity change,  appetite change, chills, fatigue and unexpected weight change. Negative for fever.  HENT: Negative for congestion, dental problem, ear pain, hearing loss, sore throat, tinnitus, trouble swallowing and voice change.   Eyes: Negative for pain, discharge and visual disturbance.  Respiratory: Negative for cough, chest tightness, wheezing and stridor.   Cardiovascular: Negative for chest pain, palpitations and leg swelling.  Gastrointestinal: Negative for abdominal distention, abdominal pain, blood in stool, constipation, diarrhea, nausea and vomiting.  Genitourinary: Negative for difficulty urinating, discharge, flank pain, genital sores, hematuria and urgency.  Musculoskeletal: Negative for arthralgias, back pain, gait problem, joint swelling, myalgias and neck stiffness.  Skin: Negative for rash.  Neurological: Negative for dizziness, syncope, speech difficulty, weakness, numbness and headaches.  Hematological: Negative for adenopathy. Does not bruise/bleed easily.  Psychiatric/Behavioral: Negative for behavioral problems and dysphoric mood. The patient is not nervous/anxious.        Objective:   Physical Exam  Constitutional: He is oriented to person, place, and time. He appears well-developed.  Blood pressure 110 over 62 Weight 146 Pulse 90  Appears clinically well  HENT:  Head: Normocephalic.  Right Ear: External ear normal.  Left Ear: External ear normal.  Eyes: Conjunctivae and EOM are normal.  Neck: Normal range of motion.  Cardiovascular: Normal rate and normal heart sounds.   Pulmonary/Chest: Breath sounds normal. No respiratory distress. He has no wheezes. He has no rales.  Abdominal: Bowel sounds are normal.  Musculoskeletal: Normal range of motion. He  exhibits no edema or tenderness.  Neurological: He is alert and oriented to person, place, and time.  Psychiatric: He has a normal mood and affect. His behavior is normal.          Assessment & Plan:    uncontrolled diabetes, improved.  Will continue to titrate basal insulin with goal of fasting blood sugar 100-120.   Recheck 1 month  .  Weight loss.  Hopeful to stabilize with  Better glycemic control   constitutional complaints with elevated sedimentation rate, anemia.  Will recheck lab today  .  History of essential hypertension.  Blood pressure has been reading low normal.  We'll discontinue amlodipine.  Patient has been asked to discontinue Allegra-D  , follow-up one month .  Patient report any new or worsening symptoms  Nyoka Cowden

## 2016-01-30 NOTE — Progress Notes (Signed)
Pre visit review using our clinic review tool, if applicable. No additional management support is needed unless otherwise documented below in the visit note. 

## 2016-01-30 NOTE — Patient Instructions (Signed)
Discontinue amlodipine  Titrate insulin to 30 units at bedtime.  Continue titration until blood sugars range from 100-120  Return in one month for follow-up

## 2016-02-08 ENCOUNTER — Other Ambulatory Visit: Payer: Self-pay | Admitting: Urgent Care

## 2016-02-09 ENCOUNTER — Other Ambulatory Visit: Payer: Self-pay | Admitting: Urgent Care

## 2016-02-09 DIAGNOSIS — J029 Acute pharyngitis, unspecified: Secondary | ICD-10-CM

## 2016-02-19 ENCOUNTER — Encounter: Payer: Medicare Other | Admitting: Internal Medicine

## 2016-02-27 ENCOUNTER — Encounter: Payer: Self-pay | Admitting: Internal Medicine

## 2016-02-27 ENCOUNTER — Ambulatory Visit (INDEPENDENT_AMBULATORY_CARE_PROVIDER_SITE_OTHER): Payer: Medicare Other | Admitting: Internal Medicine

## 2016-02-27 VITALS — BP 128/70 | HR 89 | Temp 98.1°F | Resp 20 | Ht 65.5 in | Wt 147.4 lb

## 2016-02-27 DIAGNOSIS — D649 Anemia, unspecified: Secondary | ICD-10-CM

## 2016-02-27 DIAGNOSIS — Z23 Encounter for immunization: Secondary | ICD-10-CM | POA: Diagnosis not present

## 2016-02-27 DIAGNOSIS — I10 Essential (primary) hypertension: Secondary | ICD-10-CM

## 2016-02-27 DIAGNOSIS — Z794 Long term (current) use of insulin: Secondary | ICD-10-CM

## 2016-02-27 DIAGNOSIS — E1159 Type 2 diabetes mellitus with other circulatory complications: Secondary | ICD-10-CM

## 2016-02-27 LAB — CBC WITH DIFFERENTIAL/PLATELET
BASOS PCT: 1.5 % (ref 0.0–3.0)
Basophils Absolute: 0.1 10*3/uL (ref 0.0–0.1)
EOS PCT: 2 % (ref 0.0–5.0)
Eosinophils Absolute: 0.1 10*3/uL (ref 0.0–0.7)
HCT: 29.9 % — ABNORMAL LOW (ref 39.0–52.0)
Hemoglobin: 9.1 g/dL — ABNORMAL LOW (ref 13.0–17.0)
LYMPHS ABS: 0.8 10*3/uL (ref 0.7–4.0)
Lymphocytes Relative: 13.3 % (ref 12.0–46.0)
MCHC: 30.5 g/dL (ref 30.0–36.0)
MCV: 77.1 fl — AB (ref 78.0–100.0)
MONO ABS: 1.1 10*3/uL — AB (ref 0.1–1.0)
NEUTROS PCT: 64.7 % (ref 43.0–77.0)
Neutro Abs: 3.9 10*3/uL (ref 1.4–7.7)
Platelets: 364 10*3/uL (ref 150.0–400.0)
RBC: 3.88 Mil/uL — AB (ref 4.22–5.81)
RDW: 18.5 % — AB (ref 11.5–15.5)
WBC: 6.1 10*3/uL (ref 4.0–10.5)

## 2016-02-27 LAB — COMPREHENSIVE METABOLIC PANEL
ALK PHOS: 85 U/L (ref 39–117)
ALT: 12 U/L (ref 0–53)
AST: 15 U/L (ref 0–37)
Albumin: 2.8 g/dL — ABNORMAL LOW (ref 3.5–5.2)
BUN: 19 mg/dL (ref 6–23)
CHLORIDE: 103 meq/L (ref 96–112)
CO2: 34 meq/L — AB (ref 19–32)
Calcium: 9.5 mg/dL (ref 8.4–10.5)
Creatinine, Ser: 0.92 mg/dL (ref 0.40–1.50)
GFR: 105.3 mL/min (ref >60.00)
GLUCOSE: 133 mg/dL — AB (ref 70–99)
POTASSIUM: 4.9 meq/L (ref 3.5–5.1)
SODIUM: 146 meq/L — AB (ref 135–145)
Total Bilirubin: 1.3 mg/dL — ABNORMAL HIGH (ref 0.2–1.2)
Total Protein: 6.5 g/dL (ref 6.0–8.3)

## 2016-02-27 LAB — SEDIMENTATION RATE: Sed Rate: 121 mm/hr — ABNORMAL HIGH (ref 0–20)

## 2016-02-27 MED ORDER — BENZONATATE 100 MG PO CAPS: 100.0000 mg | ORAL_CAPSULE | Freq: Two times a day (BID) | ORAL | 0 refills | Status: DC | PRN

## 2016-02-27 NOTE — Progress Notes (Signed)
Subjective:    Patient ID: [name], male    DOB: March 17, 1951, 65 y.o.   MRN: [id number]  HPI 65 year old patient who has been followed closely recently due to uncontrolled diabetes and weight loss.  He has a prior history of Hodgkin's lymphoma. He is somewhat improved with stable weight.  His appetite is marginally improved.  There has been no fever.  At times he feels chilly but no frank rigors.  He continues to have occasional night sweats that have been fairly chronic. No history of muscle pain or weakness.  He was given a brief trial of low-dose prednisone therapy for possible PMR that did not have any symptomatic improvement.  He has uptitrated basal insulin to 52 units and fasting blood sugars the last 3 days have been 06/15/1972 and 76.  Past Medical History:  Diagnosis Date  . Arthritis    joints,   . CVA (cerebral vascular accident) ([place]) 2005   hx of right brain subcortical stroke  . Diabetes mellitus type II   . H/O hiatal hernia   . Hodgkin's lymphoma ([place]) 2000   stage IV B status post ABCD chemotherapy -2000  . Hyperlipidemia   . Hypertension    Dr. Vidal Schwalbe cardiology  . 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 Pneumonia Feb 2008  . Radiculopathy    S1  . Sinus congestion 06/19/11   pt has had sinus "infection" and just finished a z-pak  dr Anderson Malta aware     Social History   Social History  . Marital status: Married    Spouse name: N/A  . Number of children: 2  . Years of education: N/A   Occupational History  . 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 Smoking status: Former Smoker    Years: 30.00    Quit date: 06/03/2004  . Smokeless tobacco: Never Used  . Alcohol use No     Comment: rarely  . Drug use: No  . Sexual activity: Not on file   Other Topics Concern  . Not on file   Social History Narrative   Lives with his wife.   Their  children are adults and live in [place].    Past Surgical History:  Procedure Laterality Date  . 2-d echocardiogram  April 2002   ejection fraction 55 to 65%  . CATARACT EXTRACTION W/PHACO  06/19/2011   Procedure: CATARACT EXTRACTION PHACO AND INTRAOCULAR LENS PLACEMENT (IOC);  Surgeon: Adonis Brook, MD;  Location: [facility];  Service: Ophthalmology;  Laterality: Right;  [place] FMA50BM +18.00 D Right  . COLONOSCOPY  2003  . lymph node biospy  2000    Family History  Problem Relation Age of Onset  . Diabetes Mother   . Heart disease Mother   . Heart failure Father   . Anesthesia problems Neg Hx   . Cancer Sister   . Cancer Brother     Allergies  Allergen Reactions  . Codeine Phosphate     Itching and gittery    Current Outpatient Prescriptions on File Prior to Visit  Medication Sig Dispense Refill  . aspirin 325 MG tablet Take 325 mg by mouth daily.      Marland Kitchen atorvastatin (LIPITOR) 10 MG tablet TAKE 1 TABLET BY MOUTH ONCE DAILY 90 tablet 1  . benzonatate (TESSALON) 100 MG capsule Take 1 capsule (100 mg total) by mouth 3 (three) times  daily as needed for cough. 30 capsule 3  . Blood Glucose Monitoring Suppl (ONETOUCH VERIO IQ SYSTEM) w/Device KIT USE TO CHECK BLOOD SUGAR 1 kit 0  . Cholecalciferol (VITAMIN D3 PO) Take 1,000 Units by mouth 2 (two) times daily.     . clotrimazole (LOTRIMIN) 1 % cream Apply topically 2 (two) times daily. 30 g 0  . Dextromethorphan-Guaifenesin (MUCINEX DM MAXIMUM STRENGTH) 60-1200 MG TB12 Take 1 tablet by mouth 2 (two) times daily. Reported on 10/20/2015    . fexofenadine-pseudoephedrine (ALLEGRA-D) 60-120 MG per tablet Take 1 tablet by mouth daily as needed. For seasonal allergies    . fluticasone (FLONASE) 50 MCG/ACT nasal spray USE 2 SPRAYS IN EACH NOSTRIL ONCE DAILY 16 g 5  . glucose blood (ONETOUCH VERIO) test strip USE TO CHECK BLOOD SUGAR DAILY AND PRN 100 each 12  . Insulin Glargine (TOUJEO SOLOSTAR) 300 UNIT/ML SOPN Inject 30 Units into the  skin at bedtime. 1 pen 3  . Insulin Pen Needle (B-D UF III MINI PEN NEEDLES) 31G X 5 MM MISC USE TWICE A DAY 100 each 5  . ipratropium (ATROVENT) 0.03 % nasal spray PLACE 2 SPRAYS INTO BOTH NOSTRILS 2 TIMES DAILY 30 mL 12  . KLOR-CON M20 20 MEQ tablet TAKE 1 TABLET BY MOUTH EVERY DAY 90 tablet 1  . LORazepam (ATIVAN) 0.5 MG tablet TAKE 2 TABLETS TWICE A DAY AS NEEDED 60 tablet 0  . losartan (COZAAR) 100 MG tablet TAKE 1 TABLET BY MOUTH ONCE DAILY 90 tablet 3  . metFORMIN (GLUCOPHAGE) 1000 MG tablet TAKE 1 TABLET BY MOUTH TWICE A DAY 180 tablet 4  . metoprolol (LOPRESSOR) 50 MG tablet TAKE 1 TABLET BY MOUTH EVERY DAY 90 tablet 1  . montelukast (SINGULAIR) 10 MG tablet TAKE 1 TABLET BY MOUTH EVERY DAY AT BEDTIME 30 tablet 0  . Multiple Vitamins-Minerals (CENTRUM SILVER PO) Take 1 tablet by mouth daily.      . OMEGA 3 1200 MG CAPS Take 1,200 mg by mouth daily.    . ONE TOUCH LANCETS MISC USE TO CHECK BLOOD SUGAR DAILY AND PRN 100 each 12  . Polyethyl Glycol-Propyl Glycol (SYSTANE ULTRA OP) Place 1-2 drops into both eyes as needed.     . terazosin (HYTRIN) 5 MG capsule TAKE 1 CAPSULE EVERY DAY 90 capsule 3  . Triamcinolone Acetonide (NASACORT AQ NA) Place 2 sprays into the nose as needed.     . TRULICITY 1.5 YD/7.4JO SOPN INJECT 1.5 MG INTO THE SKIN ONCE A WEEK. 4 pen 5  . VENTOLIN HFA 108 (90 Base) MCG/ACT inhaler INHALE 2 PUFFS INTO THE LUNGS EVERY 4 HOURS AS NEEDED FOR WHEEZING 18 Inhaler 2  . XIIDRA 5 % SOLN PLACE 1 DROP INTO EACH EYE TWICE A DAY  6   No current facility-administered medications on file prior to visit.     BP 128/70 (BP Location: Left Arm, Patient Position: Sitting, Cuff Size: Normal)   Pulse 89   Temp 98.1 F (36.7 C) (Oral)   Resp 20   Ht 5' 5.5" (1.664 m)   Wt 147 lb 6.1 oz (66.9 kg)   SpO2 97%   BMI 24.15 kg/m      Review of Systems  Constitutional: Positive for activity change, appetite change, fatigue and unexpected weight change. Negative for chills and  diaphoresis.  HENT: Negative for congestion, dental problem, ear pain, hearing loss, sore throat, tinnitus, trouble swallowing and voice change.   Eyes: Negative for pain, discharge and visual disturbance.  Respiratory: Negative for cough, chest tightness, wheezing and stridor.   Cardiovascular: Negative for chest pain, palpitations and leg swelling.  Gastrointestinal: Negative for abdominal distention, abdominal pain, blood in stool, constipation, diarrhea, nausea and vomiting.  Genitourinary: Negative for difficulty urinating, discharge, flank pain, genital sores, hematuria and urgency.  Musculoskeletal: Negative for arthralgias, back pain, gait problem, joint swelling, myalgias and neck stiffness.  Skin: Negative for rash.  Neurological: Positive for weakness. Negative for dizziness, syncope, speech difficulty, numbness and headaches.  Hematological: Negative for adenopathy. Does not bruise/bleed easily.  Psychiatric/Behavioral: Negative for behavioral problems and dysphoric mood. The patient is not nervous/anxious.        Objective:   Physical Exam  Constitutional: He is oriented to person, place, and time. He appears well-developed. No distress.  Clinically, appears well Blood pressure normal, afebrile Pulse 80 Weight 147.6   HENT:  Head: Normocephalic.  Right Ear: External ear normal.  Left Ear: External ear normal.  Eyes: Conjunctivae and EOM are normal.  Neck: Normal range of motion.  Cardiovascular: Normal rate and normal heart sounds.   Pulmonary/Chest: Breath sounds normal.  Abdominal: Bowel sounds are normal.  Musculoskeletal: Normal range of motion. He exhibits no edema or tenderness.  Neurological: He is alert and oriented to person, place, and time.  Psychiatric: He has a normal mood and affect. His behavior is normal.          Assessment & Plan:   Diabetes mellitus.  Appears to be much improved.  Will check hemoglobin A1c in one month.  Down titrate insulin  slightly to 50 units.  Continue close home on sugar monitoring  Multiple constitutional complaints/history of lymphoma/history of elevated ESR/history of anemia.  We'll check CBC and sedimentation rate  Nyoka Cowden

## 2016-02-27 NOTE — Patient Instructions (Signed)
Limit your sodium (Salt) intake    It is important that you exercise regularly, at least 20 minutes 3 to 4 times per week.  If you develop chest pain or shortness of breath seek  medical attention.  Return in one month for follow-up  Report any new or worsening symptoms

## 2016-02-28 ENCOUNTER — Other Ambulatory Visit: Payer: Self-pay | Admitting: Internal Medicine

## 2016-02-28 ENCOUNTER — Ambulatory Visit: Payer: Medicare Other | Admitting: Nurse Practitioner

## 2016-02-28 ENCOUNTER — Telehealth: Payer: Self-pay | Admitting: *Deleted

## 2016-02-28 ENCOUNTER — Ambulatory Visit (INDEPENDENT_AMBULATORY_CARE_PROVIDER_SITE_OTHER)
Admission: RE | Admit: 2016-02-28 | Discharge: 2016-02-28 | Disposition: A | Discharge status: Home / Self Care | Payer: Medicare Other | Source: Ambulatory Visit | Attending: Internal Medicine | Admitting: Internal Medicine

## 2016-02-28 DIAGNOSIS — R634 Abnormal weight loss: Secondary | ICD-10-CM | POA: Diagnosis not present

## 2016-02-28 DIAGNOSIS — C8190 Hodgkin lymphoma, unspecified, unspecified site: Secondary | ICD-10-CM | POA: Diagnosis not present

## 2016-02-28 DIAGNOSIS — D6489 Other specified anemias: Secondary | ICD-10-CM

## 2016-02-29 ENCOUNTER — Ambulatory Visit: Payer: Medicare Other

## 2016-02-29 ENCOUNTER — Telehealth: Payer: Self-pay | Admitting: Nurse Practitioner

## 2016-02-29 ENCOUNTER — Ambulatory Visit (HOSPITAL_BASED_OUTPATIENT_CLINIC_OR_DEPARTMENT_OTHER): Payer: Medicare Other | Admitting: Nurse Practitioner

## 2016-02-29 ENCOUNTER — Other Ambulatory Visit (HOSPITAL_BASED_OUTPATIENT_CLINIC_OR_DEPARTMENT_OTHER): Payer: Medicare Other

## 2016-02-29 VITALS — BP 128/58 | HR 85 | Temp 98.6°F | Resp 18 | Ht 65.5 in | Wt 146.5 lb

## 2016-02-29 DIAGNOSIS — C8190 Hodgkin lymphoma, unspecified, unspecified site: Secondary | ICD-10-CM

## 2016-02-29 DIAGNOSIS — D6489 Other specified anemias: Secondary | ICD-10-CM

## 2016-02-29 LAB — CBC WITH DIFFERENTIAL/PLATELET
BASO%: 0.4 % (ref 0.0–2.0)
Basophils Absolute: 0 10*3/uL (ref 0.0–0.1)
EOS ABS: 0 10*3/uL (ref 0.0–0.5)
EOS%: 0.6 % (ref 0.0–7.0)
HCT: 30.1 % — ABNORMAL LOW (ref 38.4–49.9)
HGB: 8.9 g/dL — ABNORMAL LOW (ref 13.0–17.1)
LYMPH%: 15.4 % (ref 14.0–49.0)
MCH: 23.5 pg — AB (ref 27.2–33.4)
MCHC: 29.6 g/dL — AB (ref 32.0–36.0)
MCV: 79.6 fL (ref 79.3–98.0)
MONO#: 0.9 10*3/uL (ref 0.1–0.9)
MONO%: 18 % — AB (ref 0.0–14.0)
NEUT#: 3.3 10*3/uL (ref 1.5–6.5)
NEUT%: 65.6 % (ref 39.0–75.0)
PLATELETS: 275 10*3/uL (ref 140–400)
RBC: 3.78 10*6/uL — AB (ref 4.20–5.82)
RDW: 17.4 % — AB (ref 11.0–14.6)
WBC: 5 10*3/uL (ref 4.0–10.3)
lymph#: 0.8 10*3/uL — ABNORMAL LOW (ref 0.9–3.3)

## 2016-02-29 LAB — COMPREHENSIVE METABOLIC PANEL
ALT: 14 U/L (ref 0–55)
ANION GAP: 14 meq/L — AB (ref 3–11)
AST: 17 U/L (ref 5–34)
Albumin: 2.3 g/dL — ABNORMAL LOW (ref 3.5–5.0)
Alkaline Phosphatase: 98 U/L (ref 40–150)
BILIRUBIN TOTAL: 1.61 mg/dL — AB (ref 0.20–1.20)
BUN: 12.7 mg/dL (ref 7.0–26.0)
CHLORIDE: 98 meq/L (ref 98–109)
CO2: 26 meq/L (ref 22–29)
Calcium: 10 mg/dL (ref 8.4–10.4)
Creatinine: 0.9 mg/dL (ref 0.7–1.3)
Glucose: 299 mg/dl — ABNORMAL HIGH (ref 70–140)
POTASSIUM: 3.7 meq/L (ref 3.5–5.1)
Sodium: 139 mEq/L (ref 136–145)
TOTAL PROTEIN: 7.5 g/dL (ref 6.4–8.3)

## 2016-02-29 LAB — LACTATE DEHYDROGENASE: LDH: 170 U/L (ref 125–245)

## 2016-02-29 LAB — CHCC SMEAR

## 2016-02-29 NOTE — Telephone Encounter (Signed)
Lab added for today, per 02/29/16 los. Avs report and appointment schedule given to patient per 02/29/16 los.

## 2016-02-29 NOTE — Progress Notes (Addendum)
[facility] OFFICE PROGRESS NOTE   Diagnosis:  Hodgkin's disease, anemia  INTERVAL HISTORY:   [name] returns prior to scheduled follow-up for evaluation of anemia. He reports an approximate 6 month history of anorexia and weight loss as well as abdominal discomfort. He estimates losing 20 pounds during this time. He has night sweats about 3 times a week. He attributes this to diabetes. No fever. He denies shortness of breath and chest pain. No change in bowel habits. No bloody or black stools. No dysphagia. No odynophagia. He denies nausea/vomiting. He reports he has been "staggering" for the past few weeks. No lightheadedness or dizziness. No unusual headaches. No vision change.  Last colonoscopy was May 2015. He had one 8 mm polyp which was a tubular adenoma.  CT scans abdomen/pelvis 01/15/2016 with no acute findings and no adenopathy.  Chest x-ray 02/28/2016 was stable mild chronic lung disease.   Objective:  Vital signs in last 24 hours:  Blood pressure (!) 128/58, pulse 85, temperature 98.6 F (37 C), temperature source Oral, resp. rate 18, height 5' 5.5" (1.664 m), weight 146 lb 8 oz (66.5 kg), SpO2 96 %.    HEENT: No thrush or ulcers. Lymphatics: No palpable cervical, supra clavicular, axillary or inguinal lymph nodes. Resp: Distant breath sounds. Cardio: Regular rate and rhythm. GI: Abdomen is soft. Mild tenderness over the mid upper abdomen. No organomegaly. Vascular: No leg edema.   Lab Results:  Lab Results  Component Value Date   WBC 5.0 02/29/2016   HGB 8.9 (L) 02/29/2016   HCT 30.1 (L) 02/29/2016   MCV 79.6 02/29/2016   PLT 275 02/29/2016   NEUTROABS 3.3 02/29/2016  Review of peripheral blood smear by Dr. Benay Spice (1) RBC-few teardrops, ovalocytes; red blood cells hypochromic; polychromasia not increased; (2) WBC-majority mature neutrophils, few bands; (3) platelets-normal.  Imaging:  Dg Chest 2 View  Result Date: 02/28/2016 CLINICAL  DATA:  Recent weight loss (40 pounds over the last 6 months). No chest complaints. History of diabetes and hypertension. Ex-smoker with history of Hodgkin's disease. EXAM: CHEST  2 VIEW COMPARISON:  Radiographs 10/20/2015 and 09/12/2015. Abdominal CT 01/15/2016. Chest CT 07/21/2006. FINDINGS: The heart size and mediastinal contours are stable. There is mild aortic atherosclerosis. There is stable chronic atelectasis or scarring in the right middle lobe or lingula on the lateral view. No confluent airspace opacity, pleural effusion or suspicious pulmonary nodule demonstrated. The bones appear unchanged. There are mild degenerative changes in the thoracic spine. IMPRESSION: Stable mild chronic lung disease. No acute cardiopulmonary process or evidence of thoracic malignancy. Electronically Signed   By: Richardean Sale M.D.   On: 02/28/2016 12:16    Medications: I have reviewed the patient's current medications.  Assessment/Plan:  1. Hodgkins lymphoma , stage IV, diagnosed in May 2000 - he remains in clinical remission. 2. History of a nodular skin rash with a biopsy August 02, 2006, confirming a CD30 positive lymphoproliferative disorder. He reports intermittent nodular skin lesions over the past few years that spontaneously resolve and occur months apart. No apparent lesions today. 3. Anemia 4. Anorexia/weight loss 5. Diabetes  Disposition: [name] presents with a six-month history of anorexia/weight loss and upper abdominal discomfort. Review of labs in the electronic medical record shows progressive anemia over the past 6 months as well. Sedimentation rate is elevated. We are obtaining additional labs today to include ferritin/iron studies, LDH and urinalysis. If he is found to be iron deficient we will refer to gastroenterology. If he  is not iron deficient he will likely require a bone marrow biopsy.  He will turn for a follow-up visit on 03/08/2016. He will contact the office the interim with any  problems.  Patient seen with Dr. Benay Spice. 25 minutes were spent face-to-face at today's visit with the majority of that time involved in counseling/coordination of care.  Elohim, Brune ANP/GNP-BC   02/29/2016  3:19 PM  This was a shared visit with [name]. Mr. Pittsley was interviewed and examined. I reviewed the peripheral blood smear. He has developed significant anemia in the setting of anorexia/weight loss. We will follow-up on serum iron studies from today and decide on proceeding with evaluation for a source of blood loss versus a bone marrow biopsy.  Julieanne Manson, M.D.

## 2016-03-01 ENCOUNTER — Other Ambulatory Visit: Payer: Self-pay | Admitting: Nurse Practitioner

## 2016-03-01 ENCOUNTER — Telehealth: Payer: Self-pay | Admitting: Nurse Practitioner

## 2016-03-01 DIAGNOSIS — C8190 Hodgkin lymphoma, unspecified, unspecified site: Secondary | ICD-10-CM

## 2016-03-01 LAB — IRON AND TIBC
%SAT: 7 % — ABNORMAL LOW (ref 20–55)
IRON: 19 ug/dL — AB (ref 42–163)
TIBC: 263 ug/dL (ref 202–409)
UIBC: 244 ug/dL (ref 117–376)

## 2016-03-01 LAB — FERRITIN: Ferritin: 365 ng/ml — ABNORMAL HIGH (ref 22–316)

## 2016-03-01 NOTE — Telephone Encounter (Signed)
I let [name] know Dr. Benay Spice has reviewed his labs--iron is normal; Dr. Benay Spice recommends a bone marrow biopsy. We will make a referral to interventional radiology at [facility].

## 2016-03-04 ENCOUNTER — Other Ambulatory Visit: Payer: Self-pay | Admitting: Radiology

## 2016-03-04 ENCOUNTER — Other Ambulatory Visit: Payer: Self-pay | Admitting: General Surgery

## 2016-03-05 ENCOUNTER — Encounter (HOSPITAL_COMMUNITY): Payer: Self-pay

## 2016-03-05 ENCOUNTER — Ambulatory Visit (HOSPITAL_COMMUNITY)
Admission: RE | Admit: 2016-03-05 | Discharge: 2016-03-05 | Disposition: A | Discharge status: Home / Self Care | Payer: Medicare Other | Source: Ambulatory Visit | Attending: Nurse Practitioner | Admitting: Nurse Practitioner

## 2016-03-05 DIAGNOSIS — I700 Atherosclerosis of aorta: Secondary | ICD-10-CM | POA: Diagnosis not present

## 2016-03-05 DIAGNOSIS — D638 Anemia in other chronic diseases classified elsewhere: Secondary | ICD-10-CM | POA: Diagnosis not present

## 2016-03-05 DIAGNOSIS — Z79899 Other long term (current) drug therapy: Secondary | ICD-10-CM | POA: Insufficient documentation

## 2016-03-05 DIAGNOSIS — Z8571 Personal history of Hodgkin lymphoma: Secondary | ICD-10-CM | POA: Insufficient documentation

## 2016-03-05 DIAGNOSIS — E785 Hyperlipidemia, unspecified: Secondary | ICD-10-CM | POA: Insufficient documentation

## 2016-03-05 DIAGNOSIS — Z888 Allergy status to other drugs, medicaments and biological substances status: Secondary | ICD-10-CM | POA: Diagnosis not present

## 2016-03-05 DIAGNOSIS — J45909 Unspecified asthma, uncomplicated: Secondary | ICD-10-CM | POA: Diagnosis not present

## 2016-03-05 DIAGNOSIS — E119 Type 2 diabetes mellitus without complications: Secondary | ICD-10-CM | POA: Diagnosis not present

## 2016-03-05 DIAGNOSIS — Z7982 Long term (current) use of aspirin: Secondary | ICD-10-CM | POA: Insufficient documentation

## 2016-03-05 DIAGNOSIS — Z9889 Other specified postprocedural states: Secondary | ICD-10-CM | POA: Diagnosis not present

## 2016-03-05 DIAGNOSIS — R634 Abnormal weight loss: Secondary | ICD-10-CM | POA: Diagnosis not present

## 2016-03-05 DIAGNOSIS — Z8673 Personal history of transient ischemic attack (TIA), and cerebral infarction without residual deficits: Secondary | ICD-10-CM | POA: Diagnosis not present

## 2016-03-05 DIAGNOSIS — C8190 Hodgkin lymphoma, unspecified, unspecified site: Secondary | ICD-10-CM

## 2016-03-05 DIAGNOSIS — Z9221 Personal history of antineoplastic chemotherapy: Secondary | ICD-10-CM | POA: Diagnosis not present

## 2016-03-05 DIAGNOSIS — D7589 Other specified diseases of blood and blood-forming organs: Secondary | ICD-10-CM | POA: Diagnosis not present

## 2016-03-05 DIAGNOSIS — Z87891 Personal history of nicotine dependence: Secondary | ICD-10-CM | POA: Insufficient documentation

## 2016-03-05 DIAGNOSIS — K449 Diaphragmatic hernia without obstruction or gangrene: Secondary | ICD-10-CM | POA: Insufficient documentation

## 2016-03-05 DIAGNOSIS — D759 Disease of blood and blood-forming organs, unspecified: Secondary | ICD-10-CM | POA: Diagnosis not present

## 2016-03-05 DIAGNOSIS — Z794 Long term (current) use of insulin: Secondary | ICD-10-CM | POA: Insufficient documentation

## 2016-03-05 DIAGNOSIS — D509 Iron deficiency anemia, unspecified: Secondary | ICD-10-CM | POA: Diagnosis not present

## 2016-03-05 DIAGNOSIS — M47814 Spondylosis without myelopathy or radiculopathy, thoracic region: Secondary | ICD-10-CM | POA: Insufficient documentation

## 2016-03-05 DIAGNOSIS — I10 Essential (primary) hypertension: Secondary | ICD-10-CM | POA: Diagnosis not present

## 2016-03-05 LAB — CBC WITH DIFFERENTIAL/PLATELET
BASOS ABS: 0 10*3/uL (ref 0.0–0.1)
Basophils Relative: 0 %
EOS PCT: 2 %
Eosinophils Absolute: 0.1 10*3/uL (ref 0.0–0.7)
HEMATOCRIT: 29.4 % — AB (ref 39.0–52.0)
HEMOGLOBIN: 8.6 g/dL — AB (ref 13.0–17.0)
LYMPHS ABS: 0.9 10*3/uL (ref 0.7–4.0)
LYMPHS PCT: 16 %
MCH: 23.4 pg — ABNORMAL LOW (ref 26.0–34.0)
MCHC: 29.3 g/dL — ABNORMAL LOW (ref 30.0–36.0)
MCV: 80.1 fL (ref 78.0–100.0)
Monocytes Absolute: 0.8 10*3/uL (ref 0.1–1.0)
Monocytes Relative: 15 %
NEUTROS ABS: 3.7 10*3/uL (ref 1.7–7.7)
NEUTROS PCT: 68 %
Platelets: 309 10*3/uL (ref 150–400)
RBC: 3.67 MIL/uL — AB (ref 4.22–5.81)
RDW: 16.8 % — ABNORMAL HIGH (ref 11.5–15.5)
WBC: 5.5 10*3/uL (ref 4.0–10.5)

## 2016-03-05 LAB — PROTIME-INR
INR: 1.03
Prothrombin Time: 13.5 seconds (ref 11.4–15.2)

## 2016-03-05 LAB — GLUCOSE, CAPILLARY: GLUCOSE-CAPILLARY: 222 mg/dL — AB (ref 65–99)

## 2016-03-05 LAB — BONE MARROW EXAM

## 2016-03-05 MED ORDER — FENTANYL CITRATE (PF) 100 MCG/2ML IJ SOLN
INTRAMUSCULAR | Status: AC | PRN
  Administered 2016-03-05 (×2): 50 ug via INTRAVENOUS

## 2016-03-05 MED ORDER — NALOXONE HCL 0.4 MG/ML IJ SOLN
INTRAMUSCULAR | Status: AC
  Filled 2016-03-05: qty 1

## 2016-03-05 MED ORDER — FLUMAZENIL 0.5 MG/5ML IV SOLN
INTRAVENOUS | Status: AC
  Filled 2016-03-05: qty 5

## 2016-03-05 MED ORDER — SODIUM CHLORIDE 0.9 % IV SOLN
INTRAVENOUS | Status: DC
  Administered 2016-03-05: 10:00:00 via INTRAVENOUS

## 2016-03-05 MED ORDER — FENTANYL CITRATE (PF) 100 MCG/2ML IJ SOLN
INTRAMUSCULAR | Status: AC
  Filled 2016-03-05: qty 2

## 2016-03-05 MED ORDER — MIDAZOLAM HCL 2 MG/2ML IJ SOLN
INTRAMUSCULAR | Status: AC | PRN
  Administered 2016-03-05 (×2): 1 mg via INTRAVENOUS

## 2016-03-05 MED ORDER — MIDAZOLAM HCL 2 MG/2ML IJ SOLN
INTRAMUSCULAR | Status: AC
  Filled 2016-03-05: qty 4

## 2016-03-05 NOTE — Procedures (Signed)
BM Aspirate and Core No comp/EBL

## 2016-03-05 NOTE — Discharge Instructions (Signed)
Bone Marrow Aspiration and Bone Marrow Biopsy, Care After °Refer to this sheet in the next few weeks. These instructions provide you with information about caring for yourself after your procedure. Your health care provider may also give you more specific instructions. Your treatment has been planned according to current medical practices, but problems sometimes occur. Call your health care provider if you have any problems or questions after your procedure. °WHAT TO EXPECT AFTER THE PROCEDURE °After your procedure, it is common to have: °· Soreness or tenderness around the puncture site. °· Bruising. °HOME CARE INSTRUCTIONS °· Take medicines only as directed by your health care provider. °· Follow your health care provider's instructions about: °¨ Puncture site care. °¨ Bandage (dressing) changes and removal. °· Bathe and shower as directed by your health care provider. °· Check your puncture site every day for signs of infection. Watch for: °¨ Redness, swelling, or pain. °¨ Fluid, blood, or pus. °· Return to your normal activities as directed by your health care provider. °· Keep all follow-up visits as directed by your health care provider. This is important. °SEEK MEDICAL CARE IF: °· You have a fever. °· You have uncontrollable bleeding. °· You have redness, swelling, or pain at the site of your puncture. °· You have fluid, blood, or pus coming from your puncture site. °  °This information is not intended to replace advice given to you by your health care provider. Make sure you discuss any questions you have with your health care provider. °  °Document Released: 12/07/2004 Document Revised: 10/04/2014 Document Reviewed: 05/11/2014 °Elsevier Interactive Patient Education ©2016 [employer] °Moderate Conscious Sedation, Adult, Care After °Refer to this sheet in the next few weeks. These instructions provide you with information on caring for yourself after your procedure. Your health care provider may also give  you more specific instructions. Your treatment has been planned according to current medical practices, but problems sometimes occur. Call your health care provider if you have any problems or questions after your procedure. °WHAT TO EXPECT AFTER THE PROCEDURE  °After your procedure: °· You may feel sleepy, clumsy, and have poor balance for several hours. °· Vomiting may occur if you eat too soon after the procedure. °HOME CARE INSTRUCTIONS °· Do not participate in any activities where you could become injured for at least 24 hours. Do not: °¨ Drive. °¨ Swim. °¨ Ride a bicycle. °¨ Operate heavy machinery. °¨ Cook. °¨ Use power tools. °¨ Climb ladders. °¨ Work from a high place. °· Do not make important decisions or sign legal documents until you are improved. °· If you vomit, drink water, juice, or soup when you can drink without vomiting. Make sure you have little or no nausea before eating solid foods. °· Only take over-the-counter or prescription medicines for pain, discomfort, or fever as directed by your health care provider. °· Make sure you and your family fully understand everything about the medicines given to you, including what side effects may occur. °· You should not drink alcohol, take sleeping pills, or take medicines that cause drowsiness for at least 24 hours. °· If you smoke, do not smoke without supervision. °· If you are feeling better, you may resume normal activities 24 hours after you were sedated. °· Keep all appointments with your health care provider. °SEEK MEDICAL CARE IF: °· Your skin is pale or bluish in color. °· You continue to feel nauseous or vomit. °· Your pain is getting worse and is not helped by medicine. °·   You have bleeding or swelling. °· You are still sleepy or feeling clumsy after 24 hours. °SEEK IMMEDIATE MEDICAL CARE IF: °· You develop a rash. °· You have difficulty breathing. °· You develop any type of allergic problem. °· You have a fever. °MAKE SURE YOU: °· Understand  these instructions. °· Will watch your condition. °· Will get help right away if you are not doing well or get worse. °  °This information is not intended to replace advice given to you by your health care provider. Make sure you discuss any questions you have with your health care provider. °  °Document Released: 03/10/2013 Document Revised: 06/10/2014 Document Reviewed: 03/10/2013 °Elsevier Interactive Patient Education ©2016 [employer] Inc. ° ° °

## 2016-03-05 NOTE — Consult Note (Signed)
Chief Complaint: Patient was seen in consultation today for CT guided bone marrow biopsy  Referring Physician(s): Sherrill,B  Supervising Physician: Marybelle Killings  Patient Status: Outpatient  History of Present Illness: [name] is a 65 y.o. male with prior history of Hodgkin's lymphoma diagnosed in May 2000, currently in clinical remission. He has had a 6 month history of progressive anemia, anorexia/weight loss and upper abdominal discomfort. Iron studies were normal. He presents today for CT-guided bone marrow biopsy for further evaluation.  Past Medical History:  Diagnosis Date  . Arthritis    joints,   . CVA (cerebral vascular accident) ([place]) 2005   hx of right brain subcortical stroke  . Diabetes mellitus type II   . H/O hiatal hernia   . Hodgkin's lymphoma ([place]) 2000   stage IV B status post ABCD chemotherapy -2000  . Hyperlipidemia   . Hypertension    Dr. Vidal Schwalbe cardiology  . 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 Pneumonia Feb 2008  . Radiculopathy    S1  . Sinus congestion 06/19/11   pt has had sinus "infection" and just finished a z-pak  dr geiger aware    Past Surgical History:  Procedure Laterality Date  . 2-d echocardiogram  April 2002   ejection fraction 55 to 65%  . CATARACT EXTRACTION W/PHACO  06/19/2011   Procedure: CATARACT EXTRACTION PHACO AND INTRAOCULAR LENS PLACEMENT (IOC);  Surgeon: Adonis Brook, MD;  Location: [facility];  Service: Ophthalmology;  Laterality: Right;  [place] FMA50BM +18.00 D Right  . COLONOSCOPY  2003  . lymph node biospy  2000    Allergies: Codeine phosphate  Medications: Prior to Admission medications   Medication Sig Start Date End Date Taking? Authorizing Provider  aspirin 325 MG tablet Take 325 mg by mouth daily.      Historical Provider, MD  atorvastatin (LIPITOR) 10 MG tablet TAKE 1 TABLET BY MOUTH ONCE DAILY 10/17/15   Marletta Lor, MD  benzonatate  (TESSALON) 100 MG capsule Take 1 capsule (100 mg total) by mouth 3 (three) times daily as needed for cough. 10/07/15   Jaynee Eagles, PA-C  Blood Glucose Monitoring Suppl (ONETOUCH VERIO IQ SYSTEM) w/Device KIT USE TO CHECK BLOOD SUGAR 10/12/15   Marletta Lor, MD  Cholecalciferol (VITAMIN D3 PO) Take 1,000 Units by mouth 2 (two) times daily.     Historical Provider, MD  clotrimazole (LOTRIMIN) 1 % cream Apply topically 2 (two) times daily. 12/14/12   Marletta Lor, MD  Dextromethorphan-Guaifenesin [facility] DM MAXIMUM STRENGTH) 60-1200 MG TB12 Take 1 tablet by mouth 2 (two) times daily. Reported on 10/20/2015    Historical Provider, MD  fexofenadine-pseudoephedrine (ALLEGRA-D) 60-120 MG per tablet Take 1 tablet by mouth daily as needed. For seasonal allergies    Historical Provider, MD  fluticasone [facility]) 50 MCG/ACT nasal spray USE 2 SPRAYS IN [facility] NOSTRIL ONCE DAILY 05/12/15   Marletta Lor, MD  glucose blood [facility] VERIO) test strip USE TO CHECK BLOOD SUGAR DAILY AND PRN 10/12/15   Marletta Lor, MD  Insulin Glargine (TOUJEO SOLOSTAR) 300 UNIT/ML SOPN Inject 30 Units into the skin at bedtime. 01/30/16   Marletta Lor, MD  Insulin Pen Needle (B-D UF III MINI PEN NEEDLES) 31G X 5 MM MISC USE TWICE A DAY 01/22/16   Marletta Lor, MD  ipratropium (ATROVENT) 0.03 % nasal spray PLACE 2 SPRAYS INTO BOTH NOSTRILS 2 TIMES DAILY 12/14/15   Chelle  Jacqulynn Cadet, PA-C  KLOR-CON M20 20 MEQ tablet TAKE 1 TABLET BY MOUTH EVERY DAY 12/25/15   Marletta Lor, MD  LORazepam (ATIVAN) 0.5 MG tablet TAKE 2 TABLETS TWICE A DAY AS NEEDED 01/09/16   Marletta Lor, MD  losartan (COZAAR) 100 MG tablet TAKE 1 TABLET BY MOUTH ONCE DAILY 11/16/15   Marletta Lor, MD  metFORMIN (GLUCOPHAGE) 1000 MG tablet TAKE 1 TABLET BY MOUTH TWICE A DAY 06/19/15   Marin Olp, MD  metoprolol (LOPRESSOR) 50 MG tablet TAKE 1 TABLET BY MOUTH EVERY DAY 10/09/15   Marletta Lor, MD  montelukast  (SINGULAIR) 10 MG tablet TAKE 1 TABLET BY MOUTH EVERY DAY AT BEDTIME 02/12/16   Jaynee Eagles, PA-C  Multiple Vitamins-Minerals (CENTRUM SILVER PO) Take 1 tablet by mouth daily.      Historical Provider, MD  OMEGA 3 1200 MG CAPS Take 1,200 mg by mouth daily.    Historical Provider, MD  ONE TOUCH LANCETS MISC USE TO CHECK BLOOD SUGAR DAILY AND PRN 10/12/15   Marletta Lor, MD  Polyethyl Glycol-Propyl Glycol (SYSTANE ULTRA OP) Place 1-2 drops into both eyes as needed.     Historical Provider, MD  terazosin (HYTRIN) 5 MG capsule TAKE 1 CAPSULE EVERY DAY 05/16/15   Marletta Lor, MD  Triamcinolone Acetonide (NASACORT AQ NA) Place 2 sprays into the nose as needed.     Historical Provider, MD  TRULICITY 1.5 JQ/3.0SP SOPN INJECT 1.5 MG INTO THE SKIN ONCE A WEEK. 12/04/15   Marletta Lor, MD  VENTOLIN HFA 108 [phone] Base) MCG/ACT inhaler INHALE 2 PUFFS INTO THE LUNGS EVERY 4 HOURS AS NEEDED FOR WHEEZING 10/26/15   Marletta Lor, MD  XIIDRA 5 % SOLN PLACE 1 DROP INTO EACH EYE TWICE A DAY 05/02/15   Historical Provider, MD     Family History  Problem Relation Age of Onset  . Diabetes Mother   . Heart disease Mother   . Heart failure Father   . Anesthesia problems Neg Hx   . Cancer Sister   . Cancer Brother     Social History   Social History  . Marital status: Married    Spouse name: N/A  . Number of children: 2  . Years of education: N/A   Occupational History  . 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 Smoking status: Former Smoker    Years: 30.00    Quit date: 06/03/2004  . Smokeless tobacco: Never Used  . Alcohol use No     Comment: rarely  . Drug use: No  . Sexual activity: Not on file   Other Topics Concern  . Not on file   Social History Narrative   Lives with his wife.   Their children are adults and live in [place].      Review of Systems currently denies fever, headache, chest pain,  dyspnea or cough, back pain, nausea, vomiting or abnormal bleeding. He does have fatigue, intermittent mild abdominal discomfort, weight loss, night sweats, and anorexia.  Vital Signs:  Vitals:   03/05/16 0911  BP: (!) 144/62  Pulse: 89  Resp: 18  Temp: 99.2 F (37.3 C)     Physical Exam patient awake, alert. Chest with distant breath sounds bilaterally. Heart with regular rate and rhythm. Abdomen soft, positive bowel sounds, mild generalized tenderness, umbilical hernia. Lower extremities with no edema.  Mallampati Score:  Imaging: Dg Chest 2 View  Result Date: 02/28/2016 CLINICAL DATA:  Recent weight loss (40 pounds over the last 6 months). No chest complaints. History of diabetes and hypertension. Ex-smoker with history of Hodgkin's disease. EXAM: CHEST  2 VIEW COMPARISON:  Radiographs 10/20/2015 and 09/12/2015. Abdominal CT 01/15/2016. Chest CT 07/21/2006. FINDINGS: The heart size and mediastinal contours are stable. There is mild aortic atherosclerosis. There is stable chronic atelectasis or scarring in the right middle lobe or lingula on the lateral view. No confluent airspace opacity, pleural effusion or suspicious pulmonary nodule demonstrated. The bones appear unchanged. There are mild degenerative changes in the thoracic spine. IMPRESSION: Stable mild chronic lung disease. No acute cardiopulmonary process or evidence of thoracic malignancy. Electronically Signed   By: Richardean Sale M.D.   On: 02/28/2016 12:16    Labs:  CBC:  Recent Labs  12/26/15 1424 01/30/16 1207 02/27/16 1059 02/29/16 1456  WBC 5.8 6.0 6.1 5.0  HGB 10.5* 10.4* 9.1* 8.9*  HCT 34.2* 33.3* 29.9* 30.1*  PLT 530.0* 419.0* 364.0 275    COAGS: No results for input(s): INR, APTT in the last 8760 hours.  BMP:  Recent Labs  08/21/15 1148 12/26/15 1424 02/27/16 1059 02/29/16 1456  NA 138 141 146* 139  K 3.8 4.8 4.9 3.7  CL 100 101 103  --   CO2 28 29 34* 26  GLUCOSE 333* 200* 133* 299*    BUN '15 16 19 '$ 12.7  CALCIUM 9.3 9.6 9.5 10.0  CREATININE 0.95 0.89 0.92 0.9    LIVER FUNCTION TESTS:  Recent Labs  08/21/15 1148 12/26/15 1424 02/27/16 1059 02/29/16 1456  BILITOT 1.1 1.2 1.3* 1.61*  AST '14 15 15 17  '$ ALT '13 12 12 14  '$ ALKPHOS 92 95 85 98  PROT 7.0 7.2 6.5 7.5  ALBUMIN 3.6 3.6 2.8* 2.3*    TUMOR MARKERS: No results for input(s): AFPTM, CEA, CA199, CHROMGRNA in the last 8760 hours.  Assessment and Plan: 65 y.o. male with prior history of Hodgkin's lymphoma diagnosed in May 2000, currently in clinical remission. He has had a 6 month history of progressive anemia, anorexia/weight loss and upper abdominal discomfort. Iron studies were normal. He presents today for CT-guided bone marrow biopsy for further evaluation.Risks and benefits discussed with the patient/wife including, but not limited to bleeding, infection, damage to adjacent structures or low yield requiring additional tests. All of the patient's questions were answered, patient is agreeable to proceed.Consent signed and in chart.      Thank you for this interesting consult.  I greatly enjoyed meeting [name] and look forward to participating in their care.  A copy of this report was sent to the requesting provider on this date.  Electronically Signed: D. Rowe Robert 03/05/2016, 9:05 AM   I spent a total of  20 minutes   in face to face in clinical consultation, greater than 50% of which was counseling/coordinating care for CT guided bone marrow biopsy

## 2016-03-08 ENCOUNTER — Ambulatory Visit (HOSPITAL_BASED_OUTPATIENT_CLINIC_OR_DEPARTMENT_OTHER): Payer: Medicare Other | Admitting: Nurse Practitioner

## 2016-03-08 ENCOUNTER — Encounter: Payer: Self-pay | Admitting: Gastroenterology

## 2016-03-08 ENCOUNTER — Telehealth: Payer: Self-pay

## 2016-03-08 VITALS — BP 133/60 | HR 90 | Temp 98.3°F | Resp 17 | Ht 65.5 in | Wt 149.7 lb

## 2016-03-08 DIAGNOSIS — R630 Anorexia: Secondary | ICD-10-CM

## 2016-03-08 DIAGNOSIS — E119 Type 2 diabetes mellitus without complications: Secondary | ICD-10-CM

## 2016-03-08 DIAGNOSIS — R634 Abnormal weight loss: Secondary | ICD-10-CM | POA: Diagnosis not present

## 2016-03-08 DIAGNOSIS — Z8571 Personal history of Hodgkin lymphoma: Secondary | ICD-10-CM

## 2016-03-08 DIAGNOSIS — D649 Anemia, unspecified: Secondary | ICD-10-CM | POA: Diagnosis not present

## 2016-03-08 DIAGNOSIS — C8190 Hodgkin lymphoma, unspecified, unspecified site: Secondary | ICD-10-CM

## 2016-03-08 LAB — URINALYSIS, MICROSCOPIC - CHCC
Bilirubin (Urine): NEGATIVE
Blood: NEGATIVE
COMMENTS:: 3
GLUCOSE UR CHCC: NEGATIVE mg/dL
Ketones: NEGATIVE mg/dL
LEUKOCYTE ESTERASE: NEGATIVE
NITRITE: NEGATIVE
PH: 6 (ref 4.6–8.0)
Protein: 30 mg/dL
RBC / HPF: NEGATIVE (ref 0–2)
SPECIFIC GRAVITY, URINE: 1.015 (ref 1.003–1.035)
UROBILINOGEN UR: 0.2 mg/dL (ref 0.2–1)

## 2016-03-08 NOTE — Telephone Encounter (Signed)
Appointments made and avs/and calendar printed   [name]

## 2016-03-08 NOTE — Progress Notes (Addendum)
  [facility] OFFICE PROGRESS NOTE   Diagnosis:  Hodgkin's disease, anemia  INTERVAL HISTORY:   Mr. Mattix returns as scheduled. He reports continued anorexia/weight loss. He is trying to increase oral intake. He continues to have upper abdominal discomfort. He reports night sweats. He attributes the night sweats to diabetes. No fever. He denies any bleeding. Some constipation.  Objective:  Vital signs in last 24 hours:  Blood pressure 133/60, pulse 90, temperature 98.3 F (36.8 C), temperature source Oral, resp. rate 17, height 5' 5.5" (1.664 m), weight 151 lb 6.4 oz (68.7 kg), SpO2 97 %.    HEENT: No thrush or ulcers. Resp: Lungs clear bilaterally. Cardio: Regular rate and rhythm. GI: Abdomen is soft. No hepatomegaly. Fullness upper mid abdomen. Vascular: No leg edema.   Lab Results:  Lab Results  Component Value Date   WBC 5.5 03/05/2016   HGB 8.6 (L) 03/05/2016   HCT 29.4 (L) 03/05/2016   MCV 80.1 03/05/2016   PLT 309 03/05/2016   NEUTROABS 3.7 03/05/2016    Imaging:  No results found.  Medications: I have reviewed the patient's current medications.  Assessment/Plan: 1. Hodgkins lymphoma , stage IV, diagnosed in May 2000 - he remains in clinical remission. 2. History of a nodular skin rash with a biopsy August 02, 2006, confirming a CD30 positive lymphoproliferative disorder. He reports intermittent nodular skin lesions over the past few years that spontaneously resolve and occur months apart. No apparent lesions today. 3. Anemia  02/29/2016 iron studies consistent with anemia of chronic disease  Bone marrow biopsy 03/05/2016-hypercellular bone marrow with trilineage hematopoiesis. Increased number of megakaryocytes many of which show atypical forms. Erythroid and granulocytic series with subtle essentially nonspecific changes. Cytogenetic analysis pending. 4. Anorexia/weight loss 5. Diabetes   Disposition: Mr. Schmieder appears unchanged. The bone  marrow biopsy does not show a clear explanation for the anemia. There was no evidence of lymphoma. We discussed that he may be developing MDS. We will follow-up on the cytogenetics from the bone marrow.  The etiology of the abdominal pain and anorexia/weight loss also remains unclear. He will complete stool cards. We made a referral to gastroenterology. He may need an upper endoscopy.  He will return for a follow-up visit and CBC in 3 weeks. He will contact the office in the interim with any problems.  Patient seen with Dr. Benay Spice. 25 minutes were spent face-to-face at today's visit with the majority of that time involved in counseling/coordination of care.    [place], [place] ANP/GNP-BC   03/08/2016  9:17 AM This was a shared visit with Ned Card. Mr. Schwabe was interviewed and examined. The etiology of the anemia remains unclear. The bone marrow biopsy is nondiagnostic. A molecular evaluation for myelodysplasia is pending. He continues to have abdominal discomfort and anorexia. We will refer him to gastroenterology to consider an upper endoscopy.  Julieanne Manson, M.D.

## 2016-03-08 NOTE — Progress Notes (Signed)
Called [facility] for appt to be made, next available is Tuesday 10/10 with Dr. Loletha Carrow. OKay to schedule with Dr. Loletha Carrow per Ned Card, NP. Pt provided with schedule with this appt.

## 2016-03-10 ENCOUNTER — Other Ambulatory Visit: Payer: Self-pay | Admitting: Urgent Care

## 2016-03-12 ENCOUNTER — Ambulatory Visit (INDEPENDENT_AMBULATORY_CARE_PROVIDER_SITE_OTHER): Payer: Medicare Other | Admitting: Gastroenterology

## 2016-03-12 ENCOUNTER — Encounter: Payer: Self-pay | Admitting: Gastroenterology

## 2016-03-12 VITALS — BP 130/80 | HR 107 | Ht 65.5 in | Wt 147.8 lb

## 2016-03-12 DIAGNOSIS — R1013 Epigastric pain: Secondary | ICD-10-CM | POA: Diagnosis not present

## 2016-03-12 DIAGNOSIS — G8929 Other chronic pain: Secondary | ICD-10-CM

## 2016-03-12 DIAGNOSIS — R634 Abnormal weight loss: Secondary | ICD-10-CM

## 2016-03-12 DIAGNOSIS — D638 Anemia in other chronic diseases classified elsewhere: Secondary | ICD-10-CM | POA: Diagnosis not present

## 2016-03-12 NOTE — Progress Notes (Signed)
[facility] Consult Note:  History: [name] 03/12/2016  Referring physician: Nyoka Cowden, MD  Reason for consult/chief complaint: Weight Loss (Since [name] has lost 20 pounds)   Subjective  HPI:  This is a 65 year old man referred by primary care after recent visits with them and oncology. [name] was treated for Hodgkin's lymphoma diagnosed in 2000, and has reportedly been in remission for many years. He started losing weight in about January of this year, and is so far down about 20 pounds. He is also found to have a normocytic anemia: Bone marrow biopsy was done, and he appears to have anemia of chronic disease. He has vague intermittent epigastric pain, food has lost its taste, he often feels quite fatigued. He denies early satiety nausea vomiting or dysphagia. He has a bowel movement every 1-2 days. His last colonoscopy with Dr. Deatra Ina in May 2015 showed diverticulosis and a single polyp.   ROS:  Review of Systems  Constitutional: Positive for appetite change, fatigue and unexpected weight change.  HENT: Negative for mouth sores and voice change.   Eyes: Negative for pain and redness.  Respiratory: Negative for cough and shortness of breath.   Cardiovascular: Negative for chest pain and palpitations.  Genitourinary: Negative for dysuria and hematuria.  Musculoskeletal: Negative for arthralgias and myalgias.  Skin: Negative for pallor and rash.  Neurological: Negative for weakness and headaches.  Hematological: Negative for adenopathy.     Past Medical History: Past Medical History:  Diagnosis Date  . Arthritis    joints,   . CVA (cerebral vascular accident) ([place]) 2005   hx of right brain subcortical stroke  . Diabetes mellitus type II   . H/O hiatal hernia   . Hodgkin's lymphoma ([place]) 2000   stage IV B status post ABCD chemotherapy -2000  . Hyperlipidemia   . Hypertension    Dr. Vidal Schwalbe cardiology  . 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 Pneumonia Feb 2008  . Radiculopathy    S1  . Sinus congestion 06/19/11   pt has had sinus "infection" and just finished a z-pak  dr Anderson Malta aware     Past Surgical History: Past Surgical History:  Procedure Laterality Date  . 2-d echocardiogram  April 2002   ejection fraction 55 to 65%  . CATARACT EXTRACTION W/PHACO  06/19/2011   Procedure: CATARACT EXTRACTION PHACO AND INTRAOCULAR LENS PLACEMENT (IOC);  Surgeon: Adonis Brook, MD;  Location: [facility];  Service: Ophthalmology;  Laterality: Right;  [place] FMA50BM +18.00 D Right  . COLONOSCOPY  2003  . lymph node biospy  2000     Family History: Family History  Problem Relation Age of Onset  . Diabetes Mother   . Heart disease Mother   . Heart failure Father   . Cancer Sister   . Cancer Brother   . Anesthesia problems Neg Hx     Social History: Social History   Social History  . Marital status: Married    Spouse name: N/A  . Number of children: 2  . Years of education: N/A   Occupational History  . 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 Smoking status: Former Smoker    Years: 30.00    Quit date: 06/03/2004  . Smokeless tobacco: Never Used  . Alcohol use No     Comment: rarely  . Drug use: No  . Sexual activity: Not Asked  Other Topics Concern  . None   Social History Narrative   Lives with his wife.   Their children are adults and live in [place].    Allergies: Allergies  Allergen Reactions  . Codeine Phosphate     Itching and gittery    Outpatient Meds: Current Outpatient Prescriptions  Medication Sig Dispense Refill  . aspirin 325 MG tablet Take 325 mg by mouth daily.      Marland Kitchen atorvastatin (LIPITOR) 10 MG tablet TAKE 1 TABLET BY MOUTH ONCE DAILY 90 tablet 1  . benzonatate (TESSALON) 100 MG capsule Take 1 capsule (100 mg total) by mouth 3 (three) times daily as needed for cough.  30 capsule 3  . Blood Glucose Monitoring Suppl (ONETOUCH VERIO IQ SYSTEM) w/Device KIT USE TO CHECK BLOOD SUGAR 1 kit 0  . Cholecalciferol (VITAMIN D3 PO) Take 1,000 Units by mouth 2 (two) times daily.     . clotrimazole (LOTRIMIN) 1 % cream Apply topically 2 (two) times daily. (Patient taking differently: Apply topically 2 (two) times daily. As needed) 30 g 0  . Dextromethorphan-Guaifenesin (MUCINEX DM MAXIMUM STRENGTH) 60-1200 MG TB12 Take 1 tablet by mouth 2 (two) times daily. Reported on 10/20/2015    . fexofenadine-pseudoephedrine (ALLEGRA-D) 60-120 MG per tablet Take 1 tablet by mouth daily as needed. For seasonal allergies    . fluticasone (FLONASE) 50 MCG/ACT nasal spray USE 2 SPRAYS IN EACH NOSTRIL ONCE DAILY 16 g 5  . glucose blood (ONETOUCH VERIO) test strip USE TO CHECK BLOOD SUGAR DAILY AND PRN 100 each 12  . Insulin Glargine (TOUJEO SOLOSTAR) 300 UNIT/ML SOPN Inject 30 Units into the skin at bedtime. 1 pen 3  . Insulin Pen Needle (B-D UF III MINI PEN NEEDLES) 31G X 5 MM MISC USE TWICE A DAY 100 each 5  . ipratropium (ATROVENT) 0.03 % nasal spray PLACE 2 SPRAYS INTO BOTH NOSTRILS 2 TIMES DAILY 30 mL 12  . KLOR-CON M20 20 MEQ tablet TAKE 1 TABLET BY MOUTH EVERY DAY 90 tablet 1  . LORazepam (ATIVAN) 0.5 MG tablet TAKE 2 TABLETS TWICE A DAY AS NEEDED 60 tablet 0  . losartan (COZAAR) 100 MG tablet TAKE 1 TABLET BY MOUTH ONCE DAILY 90 tablet 3  . metFORMIN (GLUCOPHAGE) 1000 MG tablet TAKE 1 TABLET BY MOUTH TWICE A DAY 180 tablet 4  . metoprolol (LOPRESSOR) 50 MG tablet TAKE 1 TABLET BY MOUTH EVERY DAY 90 tablet 1  . montelukast (SINGULAIR) 10 MG tablet TAKE 1 TABLET BY MOUTH EVERY DAY AT BEDTIME 30 tablet 0  . Multiple Vitamins-Minerals (CENTRUM SILVER PO) Take 1 tablet by mouth daily.      . OMEGA 3 1200 MG CAPS Take 1,200 mg by mouth daily.    . ONE TOUCH LANCETS MISC USE TO CHECK BLOOD SUGAR DAILY AND PRN 100 each 12  . Polyethyl Glycol-Propyl Glycol (SYSTANE ULTRA OP) Place 1-2 drops  into both eyes as needed.     . terazosin (HYTRIN) 5 MG capsule TAKE 1 CAPSULE EVERY DAY 90 capsule 3  . Triamcinolone Acetonide (NASACORT AQ NA) Place 2 sprays into the nose as needed.     . TRULICITY 1.5 HA/1.9FX SOPN INJECT 1.5 MG INTO THE SKIN ONCE A WEEK. 4 pen 5  . VENTOLIN HFA 108 (90 Base) MCG/ACT inhaler INHALE 2 PUFFS INTO THE LUNGS EVERY 4 HOURS AS NEEDED FOR WHEEZING 18 Inhaler 2  . XIIDRA 5 % SOLN PLACE 1 DROP INTO EACH EYE TWICE A DAY  6  No current facility-administered medications for this visit.       ___________________________________________________________________ Objective   Exam:  BP 130/80   Pulse (!) 107   Ht 5' 5.5" (1.664 m)   Wt 147 lb 12.8 oz (67 kg)   SpO2 96%   BMI 24.22 kg/m    General: this is a(n) Chronically ill-appearing man with poor muscle mass including temporal wasting. He is alert and conversational with a normal affect.   Eyes: sclera anicteric, no redness  ENT: oral mucosa moist without lesions, no cervical or supraclavicular lymphadenopathy, good dentition  CV: RRR without murmur, S1/S2, no JVD, no peripheral edema  Resp: clear to auscultation bilaterally, normal RR and effort noted  GI: soft, mild epigastric tenderness, with active bowel sounds. No guarding or palpable organomegaly noted. There is a fullness in the epigastrium   Skin; warm and dry, no rash or jaundice noted  Neuro: awake, alert and oriented x 3. Normal gross motor function and fluent speech  Labs:  CBC Latest Ref Rng & Units 03/05/2016 02/29/2016 02/27/2016  WBC 4.0 - 10.5 K/uL 5.5 5.0 6.1  Hemoglobin 13.0 - 17.0 g/dL 8.6(L) 8.9(L) 9.1(L)  Hematocrit 39.0 - 52.0 % 29.4(L) 30.1(L) 29.9(L)  Platelets 150 - 400 K/uL 309 275 364.0   CMP Latest Ref Rng & Units 02/29/2016 02/27/2016 12/26/2015  Glucose 70 - 140 mg/dl 299(H) 133(H) 200(H)  BUN 7.0 - 26.0 mg/dL 12.'7 19 16  '$ Creatinine 0.7 - 1.3 mg/dL 0.9 0.92 0.89  Sodium 136 - 145 mEq/L 139 146(H) 141  Potassium  3.5 - 5.1 mEq/L 3.7 4.9 4.8  Chloride 96 - 112 mEq/L - 103 101  CO2 22 - 29 mEq/L 26 34(H) 29  Calcium 8.4 - 10.4 mg/dL 10.0 9.5 9.6  Total Protein 6.4 - 8.3 g/dL 7.5 6.5 7.2  Total Bilirubin 0.20 - 1.20 mg/dL 1.61(H) 1.3(H) 1.2  Alkaline Phos 40 - 150 U/L 98 85 95  AST 5 - 34 U/L '17 15 15  '$ ALT 0 - 55 U/L '14 12 12     '$ Ferritin 365, Iron sat 7%  Radiologic Studies:  CTAP 01/15/16 - no Reported source abd pain or weight loss.  I have personally reviewed the images of the CT scan. While there is no gastric dilatation to suggest outlet obstruction, I think the gastric wall in the midportion of the stomach is markedly thickened and not it is typically seen just from under distention.  Assessment: Encounter Diagnoses  Name Primary?  . Abdominal pain, chronic, epigastric Yes  . Abnormal loss of weight   . Anemia, chronic disease    I'm very concerned this patient may have a gastric malignancy. I strongly advised him to have an upper endoscopy this week, and offered him two different procedures slots as soon as tomorrow. However, he seems intent on having it done after he returns from cruise in 2 weeks. He did sign procedure consent and received instructions for a slot we are holding for him the day at the end of this week. He told us that he must go home in consult the schedule and make sure that his wife can also bring on that day. We will follow up with him by phone if we do not hear from him by tomorrow and make sure we have a time set for the procedure. He and I agree there is urgency to it.  Further plans will follow.  Thank you for the courtesy of this consult.  Please call me with any  questions or concerns.  Nelida Meuse III  CC: Nyoka Cowden, MD

## 2016-03-12 NOTE — Patient Instructions (Addendum)
If you are age 67 or older, your body mass index should be between 23-30. Your Body mass index is 24.22 kg/m. If this is out of the aforementioned range listed, please consider follow up with your Primary Care Provider.  If you are age 67 or younger, your body mass index should be between 19-25. Your Body mass index is 24.22 kg/m. If this is out of the aformentioned range listed, please consider follow up with your Primary Care Provider.   You have been scheduled for an endoscopy. Please follow written instructions given to you at your visit today. If you use inhalers (even only as needed), please bring them with you on the day of your procedure.  Thank you for choosing [facility]  Dr Wilfrid Lund III

## 2016-03-15 ENCOUNTER — Encounter: Payer: Self-pay | Admitting: Gastroenterology

## 2016-03-15 ENCOUNTER — Other Ambulatory Visit: Payer: Self-pay

## 2016-03-15 ENCOUNTER — Ambulatory Visit (AMBULATORY_SURGERY_CENTER): Payer: Medicare Other | Admitting: Gastroenterology

## 2016-03-15 VITALS — BP 165/87 | HR 88 | Temp 99.3°F | Resp 34 | Ht 65.5 in | Wt 147.0 lb

## 2016-03-15 DIAGNOSIS — R630 Anorexia: Secondary | ICD-10-CM | POA: Diagnosis not present

## 2016-03-15 DIAGNOSIS — D649 Anemia, unspecified: Secondary | ICD-10-CM | POA: Diagnosis not present

## 2016-03-15 DIAGNOSIS — R634 Abnormal weight loss: Secondary | ICD-10-CM

## 2016-03-15 DIAGNOSIS — C8190 Hodgkin lymphoma, unspecified, unspecified site: Secondary | ICD-10-CM

## 2016-03-15 DIAGNOSIS — R1013 Epigastric pain: Secondary | ICD-10-CM

## 2016-03-15 DIAGNOSIS — K2970 Gastritis, unspecified, without bleeding: Secondary | ICD-10-CM | POA: Diagnosis not present

## 2016-03-15 DIAGNOSIS — B9681 Helicobacter pylori [H. pylori] as the cause of diseases classified elsewhere: Secondary | ICD-10-CM

## 2016-03-15 DIAGNOSIS — E119 Type 2 diabetes mellitus without complications: Secondary | ICD-10-CM | POA: Diagnosis not present

## 2016-03-15 DIAGNOSIS — I2510 Atherosclerotic heart disease of native coronary artery without angina pectoris: Secondary | ICD-10-CM | POA: Diagnosis not present

## 2016-03-15 DIAGNOSIS — G4733 Obstructive sleep apnea (adult) (pediatric): Secondary | ICD-10-CM | POA: Diagnosis not present

## 2016-03-15 LAB — GLUCOSE, CAPILLARY
Glucose-Capillary: 189 mg/dL — ABNORMAL HIGH (ref 65–99)
Glucose-Capillary: 131 mg/dL — ABNORMAL HIGH (ref 65–99)

## 2016-03-15 MED ORDER — SODIUM CHLORIDE 0.9 % IV SOLN: 500.0000 mL | INTRAVENOUS | Status: AC

## 2016-03-15 NOTE — Patient Instructions (Signed)
YOU HAD AN ENDOSCOPIC PROCEDURE TODAY AT [facility]:   Refer to the procedure report that was given to you for any specific questions about what was found during the examination.  If the procedure report does not answer your questions, please call your gastroenterologist to clarify.  If you requested that your care partner not be given the details of your procedure findings, then the procedure report has been included in a sealed envelope for you to review at your convenience later.  YOU SHOULD EXPECT: Some feelings of bloating in the abdomen. Passage of more gas than usual.  Walking can help get rid of the air that was put into your GI tract during the procedure and reduce the bloating. If you had a lower endoscopy (such as a colonoscopy or flexible sigmoidoscopy) you may notice spotting of blood in your stool or on the toilet paper. If you underwent a bowel prep for your procedure, you may not have a normal bowel movement for a few days.  Please Note:  You might notice some irritation and congestion in your nose or some drainage.  This is from the oxygen used during your procedure.  There is no need for concern and it should clear up in a day or so.  SYMPTOMS TO REPORT IMMEDIATELY:   Following lower endoscopy (colonoscopy or flexible sigmoidoscopy):  Excessive amounts of blood in the stool  Significant tenderness or worsening of abdominal pains  Swelling of the abdomen that is new, acute  Fever of 100F or higher   Following upper endoscopy (EGD)  Vomiting of blood or coffee ground material  New chest pain or pain under the shoulder blades  Painful or persistently difficult swallowing  New shortness of breath  Fever of 100F or higher  Black, tarry-looking stools  For urgent or emergent issues, a gastroenterologist can be reached at any hour by calling [phone].   DIET:  We do recommend a small meal at first, but then you may proceed to your regular diet.  Drink  plenty of fluids but you should avoid alcoholic beverages for 24 hours.  ACTIVITY:  You should plan to take it easy for the rest of today and you should NOT DRIVE or use heavy machinery until tomorrow (because of the sedation medicines used during the test).    FOLLOW UP: Our staff will call the number listed on your records the next business day following your procedure to check on you and address any questions or concerns that you may have regarding the information given to you following your procedure. If we do not reach you, we will leave a message.  However, if you are feeling well and you are not experiencing any problems, there is no need to return our call.  We will assume that you have returned to your regular daily activities without incident.  If any biopsies were taken you will be contacted by phone or by letter within the next 1-3 weeks.  Please call us at [phone] if you have not heard about the biopsies in 3 weeks.    SIGNATURES/CONFIDENTIALITY: You and/or your care partner have signed paperwork which will be entered into your electronic medical record.  These signatures attest to the fact that that the information above on your After Visit Summary has been reviewed and is understood.  Full responsibility of the confidentiality of this discharge information lies with you and/or your care-partner.  gastritis information given. Return to primary care at earliest available time  for further work-up of symptoms.

## 2016-03-15 NOTE — Progress Notes (Signed)
No egg or soy allergy known to patient  No issues with past sedation with any surgeries  or procedures, no intubation problems  No diet pills per patient No home 02 use per patient  No blood thinners per patient  Pt denies issues with constipation  No A fib or A flutter   

## 2016-03-15 NOTE — Progress Notes (Signed)
Called to room to assist during endoscopic procedure.  Patient ID and intended procedure confirmed with present staff. Received instructions for my participation in the procedure from the performing physician.  

## 2016-03-15 NOTE — Op Note (Signed)
[facility] Patient Name: [name] Procedure Date: 03/15/2016 7:23 AM MRN: [id number] Endoscopist: Mallie Mussel L. Loletha Carrow , MD Age: 65 Referring MD:  Date of Birth: Jul 28, 1950 Gender: Male Account #: [id number] Procedure:                Upper GI endoscopy Indications:              Epigastric abdominal pain, Anorexia, Weight loss Medicines:                Monitored Anesthesia Care Procedure:                Pre-Anesthesia Assessment:                           - Prior to the procedure, a History and Physical                            was performed, and patient medications and                            allergies were reviewed. The patient's tolerance of                            previous anesthesia was also reviewed. The risks                            and benefits of the procedure and the sedation                            options and risks were discussed with the patient.                            All questions were answered, and informed consent                            was obtained. Prior Anticoagulants: The patient has                            taken no previous anticoagulant or antiplatelet                            agents. ASA Grade Assessment: III - A patient with                            severe systemic disease. After reviewing the risks                            and benefits, the patient was deemed in                            satisfactory condition to undergo the procedure.                           After obtaining informed consent, the endoscope was  passed under direct vision. Throughout the                            procedure, the patient's blood pressure, pulse, and                            oxygen saturations were monitored continuously. The                            Model GIF-HQ190 [id number]) scope was introduced                            through the mouth, and advanced to the second part                            of  duodenum. The upper GI endoscopy was                            accomplished without difficulty. The patient                            tolerated the procedure well. Scope In: Scope Out: Findings:                 The esophagus was normal.                           Diffuse mild inflammation characterized by                            congestion (edema) and granularity was found in the                            gastric body and in the gastric antrum. Multiple                            biopsies were obtained in the gastric body and in                            the gastric antrum with cold forceps for histology.                           The cardia and gastric fundus were normal on                            retroflexion.                           The examined duodenum was normal. Complications:            No immediate complications. Estimated Blood Loss:     Estimated blood loss: none. Impression:               - Normal esophagus.                           -  Chronic gastritis.                           - Normal examined duodenum.                           - Multiple biopsies were obtained in the gastric                            body and in the gastric antrum.                           CT scan abdomen/pelvis 2 months ago was unrevealing                            for source of symptoms. Patient is up to date on                            colon cancer screening and has no lower digestive                            symptoms or imaging findings indicating a source.                           Await biopsy report to see if H. pylori present,                            but upper endoscopy findings do not appear to                            explain symptoms, especially 20 # weight loss this                            year.                           My suspicion is that there is some other systemic                            condition causing secondary GI symptoms and weight                             loss. Recommendation:           - Patient has a contact number available for                            emergencies. The signs and symptoms of potential                            delayed complications were discussed with the                            patient. Return to normal activities tomorrow.  Written discharge instructions were provided to the                            patient.                           - Resume previous diet.                           - Continue present medications.                           - Await pathology results.                           - Return to primary care physician at the next                            available appointment for further workup of                            symptoms. Aaren Krog L. Loletha Carrow, MD 03/15/2016 8:01:09 AM This report has been signed electronically. CC Letter to:             Illene Regulus, MD

## 2016-03-17 ENCOUNTER — Other Ambulatory Visit: Payer: Self-pay | Admitting: Urgent Care

## 2016-03-18 ENCOUNTER — Telehealth: Payer: Self-pay

## 2016-03-18 ENCOUNTER — Other Ambulatory Visit: Payer: Self-pay | Admitting: Internal Medicine

## 2016-03-18 NOTE — Telephone Encounter (Signed)
  Follow up Call-  Call back number 03/15/2016 10/07/2013  Post procedure Call Back phone  # [phone]  Permission to leave phone message Yes Yes  Some recent data might be hidden     Patient questions:  Do you have a fever, pain , or abdominal swelling? No. Pain Score  0 *  Have you tolerated food without any problems? Yes.    Have you been able to return to your normal activities? Yes.    Do you have any questions about your discharge instructions: Diet   No. Medications  No. Follow up visit  No.  Do you have questions or concerns about your Care? No.  Actions: * If pain score is 4 or above: No action needed, pain <4.

## 2016-03-19 LAB — TISSUE HYBRIDIZATION (BONE MARROW)-NCBH

## 2016-03-19 LAB — CHROMOSOME ANALYSIS, BONE MARROW

## 2016-03-20 ENCOUNTER — Other Ambulatory Visit: Payer: Self-pay | Admitting: Internal Medicine

## 2016-03-26 ENCOUNTER — Encounter (HOSPITAL_COMMUNITY): Payer: Self-pay

## 2016-03-26 ENCOUNTER — Other Ambulatory Visit: Payer: Self-pay

## 2016-03-26 DIAGNOSIS — A048 Other specified bacterial intestinal infections: Secondary | ICD-10-CM

## 2016-03-26 MED ORDER — BIS SUBCIT-METRONID-TETRACYC 140-125-125 MG PO CAPS: 3.0000 | ORAL_CAPSULE | Freq: Three times a day (TID) | ORAL | 0 refills | Status: DC

## 2016-03-26 NOTE — Progress Notes (Signed)
Lab ordered.

## 2016-03-26 NOTE — Progress Notes (Signed)
Letter mailed, 10/24. jf

## 2016-03-27 ENCOUNTER — Telehealth: Payer: Self-pay | Admitting: Gastroenterology

## 2016-03-27 ENCOUNTER — Other Ambulatory Visit: Payer: Self-pay

## 2016-03-27 NOTE — Telephone Encounter (Signed)
Yes, bismuth subsalicylate [id number] mg four times daily

## 2016-03-27 NOTE — Telephone Encounter (Signed)
Patient cannot afford Pylera, the separate components are:  metronidazole 125 mg, tetracycline hcl 125 mg and bismuth subcitrate potassium 140 mg.  I cannot find the bismuth subcitrate potassium to order, looking at [facility] treatment recommendation they use Bismuth subsalicylate (pepto Bismol) 525 mg QID in place of it. Do you want to prescribe that? Please advise.

## 2016-03-28 ENCOUNTER — Other Ambulatory Visit: Payer: Self-pay

## 2016-03-28 DIAGNOSIS — A048 Other specified bacterial intestinal infections: Secondary | ICD-10-CM

## 2016-03-28 MED ORDER — METRONIDAZOLE 250 MG PO TABS: 125.0000 mg | ORAL_TABLET | Freq: Four times a day (QID) | ORAL | 0 refills | Status: AC

## 2016-03-28 MED ORDER — BISMUTH SUBSALICYLATE 262 MG PO TABS: 2.0000 | ORAL_TABLET | Freq: Three times a day (TID) | ORAL | 0 refills | Status: AC

## 2016-03-28 MED ORDER — TETRACYCLINE HCL 250 MG PO CAPS: 250.0000 mg | ORAL_CAPSULE | Freq: Four times a day (QID) | ORAL | 0 refills | Status: DC

## 2016-03-28 NOTE — Telephone Encounter (Signed)
Prescriptions sent in for separate medications, patient notified. Instructed to call if he still has questions on how to take this.

## 2016-04-01 ENCOUNTER — Other Ambulatory Visit: Payer: Self-pay | Admitting: Internal Medicine

## 2016-04-02 ENCOUNTER — Ambulatory Visit (HOSPITAL_BASED_OUTPATIENT_CLINIC_OR_DEPARTMENT_OTHER): Payer: Medicare Other | Admitting: Nurse Practitioner

## 2016-04-02 ENCOUNTER — Other Ambulatory Visit: Payer: Medicare Other

## 2016-04-02 ENCOUNTER — Encounter: Payer: Self-pay | Admitting: Internal Medicine

## 2016-04-02 ENCOUNTER — Telehealth: Payer: Self-pay | Admitting: Oncology

## 2016-04-02 ENCOUNTER — Ambulatory Visit (INDEPENDENT_AMBULATORY_CARE_PROVIDER_SITE_OTHER): Payer: Medicare Other | Admitting: Internal Medicine

## 2016-04-02 VITALS — BP 135/62 | HR 83 | Temp 98.6°F | Resp 17 | Ht 65.5 in | Wt 151.1 lb

## 2016-04-02 VITALS — BP 140/72 | HR 84 | Temp 98.1°F | Resp 20 | Ht 65.5 in | Wt 150.0 lb

## 2016-04-02 DIAGNOSIS — D649 Anemia, unspecified: Secondary | ICD-10-CM

## 2016-04-02 DIAGNOSIS — Z8571 Personal history of Hodgkin lymphoma: Secondary | ICD-10-CM | POA: Diagnosis not present

## 2016-04-02 DIAGNOSIS — E1359 Other specified diabetes mellitus with other circulatory complications: Secondary | ICD-10-CM

## 2016-04-02 DIAGNOSIS — K2970 Gastritis, unspecified, without bleeding: Secondary | ICD-10-CM | POA: Diagnosis not present

## 2016-04-02 DIAGNOSIS — B9681 Helicobacter pylori [H. pylori] as the cause of diseases classified elsewhere: Secondary | ICD-10-CM | POA: Diagnosis not present

## 2016-04-02 DIAGNOSIS — I10 Essential (primary) hypertension: Secondary | ICD-10-CM

## 2016-04-02 DIAGNOSIS — E119 Type 2 diabetes mellitus without complications: Secondary | ICD-10-CM | POA: Diagnosis not present

## 2016-04-02 DIAGNOSIS — Z794 Long term (current) use of insulin: Secondary | ICD-10-CM

## 2016-04-02 DIAGNOSIS — R700 Elevated erythrocyte sedimentation rate: Secondary | ICD-10-CM

## 2016-04-02 DIAGNOSIS — E785 Hyperlipidemia, unspecified: Secondary | ICD-10-CM

## 2016-04-02 DIAGNOSIS — C8190 Hodgkin lymphoma, unspecified, unspecified site: Secondary | ICD-10-CM

## 2016-04-02 LAB — CBC WITH DIFFERENTIAL/PLATELET
BASOS PCT: 0.2 % (ref 0.0–3.0)
Basophils Absolute: 0 10*3/uL (ref 0.0–0.1)
EOS PCT: 5.4 % — AB (ref 0.0–5.0)
Eosinophils Absolute: 0.3 10*3/uL (ref 0.0–0.7)
HEMATOCRIT: 31.8 % — AB (ref 39.0–52.0)
HEMOGLOBIN: 9.8 g/dL — AB (ref 13.0–17.0)
Lymphocytes Relative: 17.5 % (ref 12.0–46.0)
Lymphs Abs: 0.9 10*3/uL (ref 0.7–4.0)
MCHC: 30.8 g/dL (ref 30.0–36.0)
MCV: 77.1 fl — ABNORMAL LOW (ref 78.0–100.0)
MONO ABS: 0.9 10*3/uL (ref 0.1–1.0)
Monocytes Relative: 16.9 % — ABNORMAL HIGH (ref 3.0–12.0)
NEUTROS ABS: 3 10*3/uL (ref 1.4–7.7)
Neutrophils Relative %: 60 % (ref 43.0–77.0)
PLATELETS: 278 10*3/uL (ref 150.0–400.0)
RBC: 4.13 Mil/uL — ABNORMAL LOW (ref 4.22–5.81)
RDW: 19.6 % — AB (ref 11.5–15.5)
WBC: 5.1 10*3/uL (ref 4.0–10.5)

## 2016-04-02 LAB — COMPREHENSIVE METABOLIC PANEL
ALK PHOS: 88 U/L (ref 39–117)
ALT: 12 U/L (ref 0–53)
AST: 16 U/L (ref 0–37)
Albumin: 3.2 g/dL — ABNORMAL LOW (ref 3.5–5.2)
BILIRUBIN TOTAL: 0.6 mg/dL (ref 0.2–1.2)
BUN: 13 mg/dL (ref 6–23)
CO2: 29 meq/L (ref 19–32)
Calcium: 9.1 mg/dL (ref 8.4–10.5)
Chloride: 105 mEq/L (ref 96–112)
Creatinine, Ser: 0.82 mg/dL (ref 0.40–1.50)
GFR: 120.22 mL/min (ref >60.00)
GLUCOSE: 129 mg/dL — AB (ref 70–99)
Potassium: 4.5 mEq/L (ref 3.5–5.1)
SODIUM: 141 meq/L (ref 135–145)
TOTAL PROTEIN: 7.1 g/dL (ref 6.0–8.3)

## 2016-04-02 LAB — SEDIMENTATION RATE: Sed Rate: 78 mm/hr — ABNORMAL HIGH (ref 0–20)

## 2016-04-02 LAB — HEMOGLOBIN A1C: Hgb A1c MFr Bld: 8 % — ABNORMAL HIGH (ref 4.6–6.5)

## 2016-04-02 NOTE — Patient Instructions (Signed)
Take your antibiotic as prescribed until ALL of it is gone, but stop if you develop a rash, swelling, or any side effects of the medication.  Contact our office as soon as possible if  there are side effects of the medication.  Return in one month for follow-up  Hematology follow-up as scheduled  Report any new or worsening symptoms

## 2016-04-02 NOTE — Progress Notes (Signed)
  [facility] OFFICE PROGRESS NOTE   Diagnosis:  Hodgkin's disease, anemia  INTERVAL HISTORY:   [name] returns as scheduled. He has started treatment for H. pylori. The abdominal pain is better. He has also noted some improvement in his appetite. He reports he is gaining weight.  Objective:  Vital signs in last 24 hours:  Blood pressure 135/62, pulse 83, temperature 98.6 F (37 C), temperature source Oral, resp. rate 17, height 5' 5.5" (1.664 m), weight 151 lb 1.6 oz (68.5 kg), SpO2 100 %.    HEENT: No thrush or ulcers. Resp: Lungs clear bilaterally. Cardio: Regular rate and rhythm. GI: Abdomen is soft. Mildly tender at the upper abdomen. No hepatomegaly. Amegaly.Fullness upper medial right abdomen. Vascular: No leg edema.    Lab Results:  Lab Results  Component Value Date   WBC 5.1 04/02/2016   HGB 9.8 (L) 04/02/2016   HCT 31.8 (L) 04/02/2016   MCV 77.1 (L) 04/02/2016   PLT 278.0 04/02/2016   NEUTROABS 3.0 04/02/2016    Imaging:  No results found.  Medications: I have reviewed the patient's current medications.  Assessment/Plan: 1. Hodgkins lymphoma , stage IV, diagnosed in May 2000 - he remains in clinical remission. 2. History of a nodular skin rash with a biopsy August 02, 2006, confirming a CD30 positive lymphoproliferative disorder. He reports intermittent nodular skin lesions over the past few years that spontaneously resolve and occur months apart. No apparent lesions today. 3. Anemia  02/29/2016 iron studies consistent with anemia of chronic disease  Bone marrow biopsy 03/05/2016-hypercellular bone marrow with trilineage hematopoiesis. Increased number of megakaryocytes many of which show atypical forms. Erythroid and granulocytic series with subtle essentially nonspecific changes. Cytogenetic analysis with normal findings. 4. Anorexia/weight loss 5. Diabetes 6. 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 Biopsy gastric body showed chronic helicobacter pylori gastritis; Warthin-starry stain positive for Helicobacter pylori; no intestinal metaplasia, dysplasia or malignancy. Treatment initiated per Dr. Loletha Carrow.   Disposition: [name] was found to have Helicobacter pylori gastritis on the recent upper endoscopy. Treatment has been initiated per gastroenterology. He has noted improvement in the abdominal pain as well as appetite. Hemoglobin is better.  We scheduled a return visit here with labs in 4 weeks. He will contact the office in the interim with any problems.  Plan reviewed with Dr. Benay Spice.    Braylyn, Kalter ANP/GNP-BC   04/02/2016  2:02 PM

## 2016-04-02 NOTE — Progress Notes (Signed)
Pre visit review using our clinic review tool, if applicable. No additional management support is needed unless otherwise documented below in the visit note. 

## 2016-04-02 NOTE — Telephone Encounter (Signed)
Gave patient avs report and appointments for December  °

## 2016-04-02 NOTE — Progress Notes (Signed)
Lab Results  Component Value Date   HGBA1C 7.9 (H) 12/26/2015

## 2016-04-02 NOTE — Progress Notes (Signed)
Subjective:    Patient ID: [name], male    DOB: 1951/05/28, 65 y.o.   MRN: [id number]  HPI  Lab Results  Component Value Date   HGBA1C 7.9 (H) 12/26/2015    Wt Readings from Last 3 Encounters:  04/02/16 150 lb (68 kg)  03/15/16 147 lb (66.7 kg)  03/12/16 147 lb 12.8 oz (48 kg)   65 year old patient who is seen today in follow-up.  He has had a significant weight loss that perhaps has stabilized over the past 2 weeks.  He still feels weak, but he feels his appetite is modestly improved.  He has returned from a [place]. Complains of a sense of being chilled, but no frank rigors or documented fever He underwent esophagogastroduodenoscopy and has been treated for H. pylori gastritis for the past 7 days He is followed by hematology due to his anemia and weight loss.  Evaluation has included a bone marrow biopsy  His diabetes has improved.  Fasting blood sugars over the past couple weeks have ranged from 97-159.  Past Medical History:  Diagnosis Date  . Anemia   . Arthritis    joints,   . Cataract    removed both eyes  . COPD (chronic obstructive pulmonary disease) (HCC)    mild  . CVA (cerebral vascular accident) ([place]) 2005   hx of right brain subcortical stroke  . Diabetes mellitus type II   . H/O hiatal hernia   . Hodgkin's lymphoma ([place]) 2000   stage IV B status post ABCD chemotherapy -2000  . Hyperlipidemia   . Hypertension    Dr. Vidal Schwalbe cardiology  . 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 Pneumonia Feb 2008  . Radiculopathy    S1  . Sinus congestion 06/19/11   pt has had sinus "infection" and just finished a z-pak  dr geiger aware  . Sleep apnea    npo cpap     Social History   Social History  . Marital status: Married    Spouse name: N/A  . Number of children: 2  . Years of education: N/A   Occupational History  . 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 Smoking status: Former Smoker    Years: 30.00    Quit date: 06/03/2004  . Smokeless tobacco: Never Used  . Alcohol use No     Comment: rarely  . Drug use: No  . Sexual activity: Not on file   Other Topics Concern  . Not on file   Social History Narrative   Lives with his wife.   Their children are adults and live in [place].    Past Surgical History:  Procedure Laterality Date  . 2-d echocardiogram  April 2002   ejection fraction 55 to 65%  . CATARACT EXTRACTION W/PHACO  06/19/2011   Procedure: CATARACT EXTRACTION PHACO AND INTRAOCULAR LENS PLACEMENT (IOC);  Surgeon: Adonis Brook, MD;  Location: [facility];  Service: Ophthalmology;  Laterality: Right;  [place] FMA50BM +18.00 D Right  . COLONOSCOPY  2003  . lymph node biospy  2000    Family History  Problem Relation Age of Onset  . Diabetes Mother   . Heart disease Mother   . Heart failure Father   . Cancer Sister   . Lung cancer Sister   . Cancer Brother   . Lung cancer Brother   . Anesthesia problems  Neg Hx   . Colon cancer Neg Hx   . Colon polyps Neg Hx   . Esophageal cancer Neg Hx     Allergies  Allergen Reactions  . Codeine Phosphate     Itching and gittery    Current Outpatient Prescriptions on File Prior to Visit  Medication Sig Dispense Refill  . aspirin 325 MG tablet Take 325 mg by mouth daily.      Marland Kitchen atorvastatin (LIPITOR) 10 MG tablet TAKE 1 TABLET BY MOUTH ONCE DAILY 90 tablet 1  . benzonatate (TESSALON) 100 MG capsule Take 1 capsule (100 mg total) by mouth 3 (three) times daily as needed for cough. 30 capsule 3  . Bismuth Subsalicylate 902 MG TABS Take 2 tablets (524 mg total) by mouth 4 (four) times daily -  with meals and at bedtime. 80 each 0  . bismuth-metronidazole-tetracycline (PYLERA) 140-125-125 MG capsule Take 3 capsules by mouth 4 (four) times daily -  before meals and at bedtime. 120 capsule 0  . Blood Glucose Monitoring Suppl (ONETOUCH VERIO IQ SYSTEM)  w/Device KIT USE TO CHECK BLOOD SUGAR 1 kit 0  . Cholecalciferol (VITAMIN D3 PO) Take 1,000 Units by mouth 2 (two) times daily.     . clotrimazole (LOTRIMIN) 1 % cream Apply topically 2 (two) times daily. (Patient taking differently: Apply topically 2 (two) times daily. As needed) 30 g 0  . Dextromethorphan-Guaifenesin (MUCINEX DM MAXIMUM STRENGTH) 60-1200 MG TB12 Take 1 tablet by mouth 2 (two) times daily. Reported on 10/20/2015    . fexofenadine-pseudoephedrine (ALLEGRA-D) 60-120 MG per tablet Take 1 tablet by mouth daily as needed. For seasonal allergies    . fluticasone (FLONASE) 50 MCG/ACT nasal spray USE 2 SPRAYS IN EACH NOSTRIL ONCE DAILY 16 g 5  . glucose blood (ONETOUCH VERIO) test strip USE TO CHECK BLOOD SUGAR DAILY AND PRN 100 each 12  . Insulin Glargine (TOUJEO SOLOSTAR) 300 UNIT/ML SOPN Inject 30 Units into the skin at bedtime. (Patient taking differently: Inject 52 Units into the skin at bedtime. ) 1 pen 3  . Insulin Pen Needle (B-D UF III MINI PEN NEEDLES) 31G X 5 MM MISC USE TWICE A DAY 100 each 5  . ipratropium (ATROVENT) 0.03 % nasal spray PLACE 2 SPRAYS INTO BOTH NOSTRILS 2 TIMES DAILY 30 mL 12  . KLOR-CON M20 20 MEQ tablet TAKE 1 TABLET BY MOUTH EVERY DAY 90 tablet 1  . LORazepam (ATIVAN) 0.5 MG tablet TAKE 2 TABLETS TWICE A DAY AS NEEDED 60 tablet 0  . losartan (COZAAR) 100 MG tablet TAKE 1 TABLET BY MOUTH ONCE DAILY 90 tablet 3  . metFORMIN (GLUCOPHAGE) 1000 MG tablet TAKE 1 TABLET BY MOUTH TWICE A DAY 180 tablet 4  . metoprolol (LOPRESSOR) 50 MG tablet TAKE 1 TABLET BY MOUTH EVERY DAY 90 tablet 1  . metroNIDAZOLE (FLAGYL) 250 MG tablet Take 0.5 tablets (125 mg total) by mouth 4 (four) times daily. Take after each meal and at bedtime 20 tablet 0  . montelukast (SINGULAIR) 10 MG tablet TAKE 1 TABLET BY MOUTH EVERY DAY AT BEDTIME 30 tablet 12  . Multiple Vitamins-Minerals (CENTRUM SILVER PO) Take 1 tablet by mouth daily.      . OMEGA 3 1200 MG CAPS Take 1,200 mg by mouth daily.     . ONE TOUCH LANCETS MISC USE TO CHECK BLOOD SUGAR DAILY AND PRN 100 each 12  . terazosin (HYTRIN) 5 MG capsule TAKE 1 CAPSULE EVERY DAY 90 capsule 3  . tetracycline (ACHROMYCIN,SUMYCIN)  250 MG capsule Take 1 capsule (250 mg total) by mouth 4 (four) times daily. Take after each meal and at bedtime 40 capsule 0  . Triamcinolone Acetonide (NASACORT AQ NA) Place 2 sprays into the nose as needed.     . TRULICITY 1.5 CW/2.3JS SOPN INJECT 1.5 MG INTO THE SKIN ONCE A WEEK. 4 pen 5  . VENTOLIN HFA 108 (90 Base) MCG/ACT inhaler INHALE 2 PUFFS INTO THE LUNGS EVERY 4 HOURS AS NEEDED FOR WHEEZING 18 Inhaler 2  . XIIDRA 5 % SOLN PLACE 1 DROP INTO EACH EYE TWICE A DAY  6   Current Facility-Administered Medications on File Prior to Visit  Medication Dose Route Frequency Provider Last Rate Last Dose  . 0.9 %  sodium chloride infusion  500 mL Intravenous Continuous Nelida Meuse III, MD        BP 140/72 (BP Location: Left Arm, Patient Position: Sitting, Cuff Size: Normal)   Pulse 84   Temp 98.1 F (36.7 C) (Oral)   Resp 20   Ht 5' 5.5" (1.664 m)   Wt 150 lb (68 kg)   SpO2 98%   BMI 24.58 kg/m     Review of Systems  Constitutional: Positive for activity change, appetite change and fatigue. Negative for chills and fever.  HENT: Negative for congestion, dental problem, ear pain, hearing loss, sore throat, tinnitus, trouble swallowing and voice change.   Eyes: Negative for pain, discharge and visual disturbance.  Respiratory: Negative for cough, chest tightness, wheezing and stridor.   Cardiovascular: Negative for chest pain, palpitations and leg swelling.  Gastrointestinal: Negative for abdominal distention, abdominal pain, blood in stool, constipation, diarrhea, nausea and vomiting.  Genitourinary: Positive for urgency. Negative for difficulty urinating, discharge, flank pain, genital sores and hematuria.  Musculoskeletal: Negative for arthralgias, back pain, gait problem, joint swelling, myalgias  and neck stiffness.  Skin: Negative for rash.  Neurological: Positive for weakness. Negative for dizziness, syncope, speech difficulty, numbness and headaches.  Hematological: Negative for adenopathy. Does not bruise/bleed easily.  Psychiatric/Behavioral: Negative for behavioral problems and dysphoric mood. The patient is not nervous/anxious.        Objective:   Physical Exam  Constitutional: He is oriented to person, place, and time. He appears well-developed.  HENT:  Head: Normocephalic.  Right Ear: External ear normal.  Left Ear: External ear normal.  Eyes: Conjunctivae and EOM are normal.  Neck: Normal range of motion.  Cardiovascular: Normal rate and normal heart sounds.   Pulmonary/Chest: He has rales.  Mild basilar crackles.  O2 saturation 98%   Abdominal: Bowel sounds are normal.  Musculoskeletal: Normal range of motion. He exhibits no edema or tenderness.  Neurological: He is alert and oriented to person, place, and time.  Psychiatric: He has a normal mood and affect. His behavior is normal.          Assessment & Plan:   H. pylori gastritis.  Will complete antibiotic therapy and PPI Anemia.  Will recheck CBC Weight loss.  Hopefully has stabilized Diabetes mellitus.  Improved.  Will check hemoglobin A1c.  We'll consider a mealtime insulin  Recheck one month Follow-up hematology  Nyoka Cowden

## 2016-04-03 ENCOUNTER — Other Ambulatory Visit: Payer: Self-pay | Admitting: *Deleted

## 2016-04-03 MED ORDER — INSULIN LISPRO 100 UNIT/ML (KWIKPEN): PEN_INJECTOR | SUBCUTANEOUS | 3 refills | Status: AC

## 2016-04-18 ENCOUNTER — Telehealth: Payer: Self-pay | Admitting: Gastroenterology

## 2016-04-19 ENCOUNTER — Other Ambulatory Visit: Payer: Self-pay | Admitting: Internal Medicine

## 2016-04-19 NOTE — Telephone Encounter (Signed)
Spoke to patient, he stopped the treatment medications on 11/5. He has not taken any Prilosec since then and is having some abdominal pain. He was questioning when to submit his stool sample. Instructed him that since he is going out of town for the Thanksgiving holiday that he may want to continue with the Prilosec. He will need to have been off of it for 5 days prior to submitting stool sample to retest for h pylori. Patient understands and will do this after Thanksgiving.

## 2016-04-28 ENCOUNTER — Ambulatory Visit (INDEPENDENT_AMBULATORY_CARE_PROVIDER_SITE_OTHER): Payer: Medicare Other

## 2016-04-28 ENCOUNTER — Ambulatory Visit (HOSPITAL_COMMUNITY)
Admission: EM | Admit: 2016-04-28 | Discharge: 2016-04-28 | Disposition: A | Discharge status: Home / Self Care | Payer: Medicare Other | Attending: Family Medicine | Admitting: Family Medicine

## 2016-04-28 ENCOUNTER — Encounter (HOSPITAL_COMMUNITY): Payer: Self-pay | Admitting: *Deleted

## 2016-04-28 DIAGNOSIS — B9789 Other viral agents as the cause of diseases classified elsewhere: Secondary | ICD-10-CM

## 2016-04-28 DIAGNOSIS — J069 Acute upper respiratory infection, unspecified: Secondary | ICD-10-CM

## 2016-04-28 DIAGNOSIS — R05 Cough: Secondary | ICD-10-CM | POA: Diagnosis not present

## 2016-04-28 MED ORDER — BENZONATATE 200 MG PO CAPS: 200.0000 mg | ORAL_CAPSULE | Freq: Three times a day (TID) | ORAL | 1 refills | Status: DC

## 2016-04-28 NOTE — Discharge Instructions (Signed)
Drink plenty of fluids as discussed, use medicine as prescribed, and mucinex or delsym for cough. Return or see your doctor if further problems °

## 2016-04-28 NOTE — ED Provider Notes (Signed)
[facility]    CSN: [id number] Arrival date & time: 04/28/16  1814     History   Chief Complaint Chief Complaint  Patient presents with  . Cough    HPI [name] is a 65 y.o. male.   The history is provided by the patient and the spouse.  Cough  Cough characteristics:  Non-productive and dry Severity:  Moderate Onset quality:  Gradual Duration:  4 days Progression:  Unchanged Chronicity:  New Smoker: no   Context: upper respiratory infection and weather changes   Context comment:  Just back from [place] for holiday. Ineffective treatments:  Home nebulizer Associated symptoms: rhinorrhea and sinus congestion   Associated symptoms: no fever and no wheezing     Past Medical History:  Diagnosis Date  . Anemia   . Arthritis    joints,   . Cataract    removed both eyes  . COPD (chronic obstructive pulmonary disease) (HCC)    mild  . CVA (cerebral vascular accident) ([place]) 2005   hx of right brain subcortical stroke  . Diabetes mellitus type II   . H/O hiatal hernia   . Hodgkin's lymphoma ([place]) 2000   stage IV B status post ABCD chemotherapy -2000  . Hyperlipidemia   . Hypertension    Dr. Vidal Schwalbe cardiology  . 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 Pneumonia Feb 2008  . Radiculopathy    S1  . Sinus congestion 06/19/11   pt has had sinus "infection" and just finished a z-pak  dr geiger aware  . Sleep apnea    npo cpap    Patient Active Problem List   Diagnosis Date Noted  . Helicobacter pylori gastritis 04/02/2016  . Anemia 01/30/2016  . Obstructive sleep apnea 04/13/2015  . Hodgkin's disease ([place]) 11/06/2006  . Diabetes mellitus with circulatory complication ([place]) 37/85/8850  . Dyslipidemia 11/06/2006  . Essential hypertension 11/06/2006  . Congestive heart failure ([place]) 11/06/2006  . History of cardiovascular disorder 11/06/2006    Past Surgical History:  Procedure Laterality Date  .  2-d echocardiogram  April 2002   ejection fraction 55 to 65%  . CATARACT EXTRACTION W/PHACO  06/19/2011   Procedure: CATARACT EXTRACTION PHACO AND INTRAOCULAR LENS PLACEMENT (IOC);  Surgeon: Adonis Brook, MD;  Location: [facility];  Service: Ophthalmology;  Laterality: Right;  [place] FMA50BM +18.00 D Right  . COLONOSCOPY  2003  . lymph node biospy  2000       Home Medications    Prior to Admission medications   Medication Sig Start Date End Date Taking? Authorizing Provider  aspirin 325 MG tablet Take 325 mg by mouth daily.      Historical Provider, MD  atorvastatin (LIPITOR) 10 MG tablet TAKE 1 TABLET BY MOUTH ONCE DAILY 10/17/15   Marletta Lor, MD  benzonatate (TESSALON) 100 MG capsule Take 1 capsule (100 mg total) by mouth 3 (three) times daily as needed for cough. 10/07/15   Jaynee Eagles, PA-C  benzonatate (TESSALON) 100 MG capsule TAKE 1 CAPSULE (100 MG TOTAL) BY MOUTH 2 (TWO) TIMES DAILY AS NEEDED FOR COUGH. 04/19/16   Marletta Lor, MD  bismuth-metronidazole-tetracycline [facility]) [phone] MG capsule Take 3 capsules by mouth 4 (four) times daily -  before meals and at bedtime. 03/26/16   Nelida Meuse III, MD  Blood Glucose Monitoring Suppl [facility] VERIO IQ SYSTEM) w/Device KIT USE TO CHECK BLOOD SUGAR 10/12/15   Marletta Lor, MD  Cholecalciferol (VITAMIN D3 PO) Take 1,000 Units by mouth 2 (two) times daily.     Historical Provider, MD  clotrimazole (LOTRIMIN) 1 % cream Apply topically 2 (two) times daily. Patient taking differently: Apply topically 2 (two) times daily. As needed 12/14/12   Marletta Lor, MD  Dextromethorphan-Guaifenesin [facility] DM MAXIMUM STRENGTH) 60-1200 MG TB12 Take 1 tablet by mouth 2 (two) times daily. Reported on 10/20/2015    Historical Provider, MD  fexofenadine-pseudoephedrine (ALLEGRA-D) 60-120 MG per tablet Take 1 tablet by mouth daily as needed. For seasonal allergies    Historical Provider, MD  fluticasone [facility]) 50 MCG/ACT  nasal spray USE 2 SPRAYS IN [facility] NOSTRIL ONCE DAILY 04/01/16   Marletta Lor, MD  glucose blood [facility] VERIO) test strip USE TO CHECK BLOOD SUGAR DAILY AND PRN 10/12/15   Marletta Lor, MD  Insulin Glargine (TOUJEO SOLOSTAR) 300 UNIT/ML SOPN Inject 30 Units into the skin at bedtime. Patient taking differently: Inject 52 Units into the skin at bedtime.  01/30/16   Marletta Lor, MD  insulin lispro (HUMALOG KWIKPEN) 100 UNIT/ML KiwkPen Administer 4 units prior to breakfast and lunch and 8 units prior to evening meal. 04/03/16   Marletta Lor, MD  Insulin Pen Needle (B-D UF III MINI PEN NEEDLES) 31G X 5 MM MISC USE TWICE A DAY 01/22/16   Marletta Lor, MD  ipratropium (ATROVENT) 0.03 % nasal spray PLACE 2 SPRAYS INTO BOTH NOSTRILS 2 TIMES DAILY 12/14/15   Chelle Jeffery, PA-C  KLOR-CON M20 20 MEQ tablet TAKE 1 TABLET BY MOUTH EVERY DAY 12/25/15   Marletta Lor, MD  LORazepam (ATIVAN) 0.5 MG tablet TAKE 2 TABLETS TWICE A DAY AS NEEDED 01/09/16   Marletta Lor, MD  losartan (COZAAR) 100 MG tablet TAKE 1 TABLET BY MOUTH ONCE DAILY 11/16/15   Marletta Lor, MD  metFORMIN (GLUCOPHAGE) 1000 MG tablet TAKE 1 TABLET BY MOUTH TWICE A DAY 06/19/15   Marin Olp, MD  metoprolol (LOPRESSOR) 50 MG tablet TAKE 1 TABLET BY MOUTH EVERY DAY 10/09/15   Marletta Lor, MD  montelukast (SINGULAIR) 10 MG tablet TAKE 1 TABLET BY MOUTH EVERY DAY AT BEDTIME 03/21/16   Marletta Lor, MD  Multiple Vitamins-Minerals (CENTRUM SILVER PO) Take 1 tablet by mouth daily.      Historical Provider, MD  OMEGA 3 1200 MG CAPS Take 1,200 mg by mouth daily.    Historical Provider, MD  ONE TOUCH LANCETS MISC USE TO CHECK BLOOD SUGAR DAILY AND PRN 10/12/15   Marletta Lor, MD  terazosin (HYTRIN) 5 MG capsule TAKE 1 CAPSULE EVERY DAY 05/16/15   Marletta Lor, MD  tetracycline (ACHROMYCIN,SUMYCIN) 250 MG capsule Take 1 capsule (250 mg total) by mouth 4 (four) times daily. Take  after each meal and at bedtime 03/28/16   Nelida Meuse III, MD  Triamcinolone Acetonide (NASACORT AQ NA) Place 2 sprays into the nose as needed.     Historical Provider, MD  TRULICITY 1.5 ZO/1.0RU SOPN INJECT 1.5 MG INTO THE SKIN ONCE A WEEK. 12/04/15   Marletta Lor, MD  VENTOLIN HFA 108 [phone] Base) MCG/ACT inhaler INHALE 2 PUFFS INTO THE LUNGS EVERY 4 HOURS AS NEEDED FOR WHEEZING 03/19/16   Marletta Lor, MD  XIIDRA 5 % SOLN PLACE 1 DROP INTO EACH EYE TWICE A DAY 05/02/15   Historical Provider, MD    Family History Family History  Problem Relation Age of Onset  . Diabetes Mother   .  Heart disease Mother   . Heart failure Father   . Cancer Sister   . Lung cancer Sister   . Cancer Brother   . Lung cancer Brother   . Anesthesia problems Neg Hx   . Colon cancer Neg Hx   . Colon polyps Neg Hx   . Esophageal cancer Neg Hx     Social History Social History  Substance Use Topics  . Smoking status: Former Smoker    Years: 30.00    Quit date: 06/03/2004  . Smokeless tobacco: Never Used  . Alcohol use No     Comment: rarely     Allergies   Codeine phosphate   Review of Systems Review of Systems  Constitutional: Negative.  Negative for fever.  HENT: Positive for congestion, postnasal drip and rhinorrhea.   Respiratory: Positive for cough. Negative for wheezing.   Cardiovascular: Negative.   Gastrointestinal: Negative.   All other systems reviewed and are negative.    Physical Exam Triage Vital Signs ED Triage Vitals [04/28/16 1916]  Enc Vitals Group     BP 161/73     Pulse Rate 97     Resp 16     Temp 98.7 F (37.1 C)     Temp Source Oral     SpO2      Weight      Height      Head Circumference      Peak Flow      Pain Score      Pain Loc      Pain Edu?      Excl. in GC?    No data found.   Updated Vital Signs BP 161/73 (BP Location: Left Arm)   Pulse 97   Temp 98.7 F (37.1 C) (Oral)   Resp 16   Visual Acuity Right Eye Distance:   Left  Eye Distance:   Bilateral Distance:    Right Eye Near:   Left Eye Near:    Bilateral Near:     Physical Exam  Constitutional: He appears well-developed and well-nourished. No distress.  HENT:  Right Ear: External ear normal.  Left Ear: External ear normal.  Mouth/Throat: Uvula is midline and mucous membranes are normal. Posterior oropharyngeal erythema present. No oropharyngeal exudate.  Neck: Normal range of motion. Neck supple.  Cardiovascular: Normal rate, regular rhythm, normal heart sounds and intact distal pulses.   Pulmonary/Chest: Effort normal and breath sounds normal.  Lymphadenopathy:    He has no cervical adenopathy.  Skin: Skin is warm and dry.  Nursing note and vitals reviewed.    UC Treatments / Results  Labs (all labs ordered are listed, but only abnormal results are displayed) Labs Reviewed - No data to display  EKG  EKG Interpretation None       Radiology No results found. X-rays reviewed and report per radiologist.  Procedures Procedures (including critical care time)  Medications Ordered in UC Medications - No data to display   Initial Impression / Assessment and Plan / UC Course  I have reviewed the triage vital signs and the nursing notes.  Pertinent labs & imaging results that were available during my care of the patient were reviewed by me and considered in my medical decision making (see chart for details).  Clinical Course       Final Clinical Impressions(s) / UC Diagnoses   Final diagnoses:  None    New Prescriptions New Prescriptions   No medications on file     Fayrene Fearing  Damian Leavell, MD 04/28/16 2026

## 2016-04-28 NOTE — ED Triage Notes (Signed)
Pt  Reports   Symptoms   Of  Cough   /  Congested     With   Sinus  Drainage         X  4  Days       Pt  Recently  Returned  From  [employer]  Upright on the  Exam table  Speaking in  Complete  sentances   And  Is  In no  Severe  Distress

## 2016-04-30 ENCOUNTER — Other Ambulatory Visit: Payer: Medicare Other

## 2016-05-03 ENCOUNTER — Ambulatory Visit (INDEPENDENT_AMBULATORY_CARE_PROVIDER_SITE_OTHER): Payer: Medicare Other | Admitting: Internal Medicine

## 2016-05-03 ENCOUNTER — Encounter: Payer: Self-pay | Admitting: Internal Medicine

## 2016-05-03 VITALS — BP 146/70 | HR 97 | Temp 98.4°F | Resp 20 | Ht 65.5 in | Wt 148.0 lb

## 2016-05-03 DIAGNOSIS — I10 Essential (primary) hypertension: Secondary | ICD-10-CM

## 2016-05-03 DIAGNOSIS — G4733 Obstructive sleep apnea (adult) (pediatric): Secondary | ICD-10-CM | POA: Diagnosis not present

## 2016-05-03 DIAGNOSIS — K2970 Gastritis, unspecified, without bleeding: Secondary | ICD-10-CM | POA: Diagnosis not present

## 2016-05-03 DIAGNOSIS — E1359 Other specified diabetes mellitus with other circulatory complications: Secondary | ICD-10-CM

## 2016-05-03 DIAGNOSIS — B9681 Helicobacter pylori [H. pylori] as the cause of diseases classified elsewhere: Secondary | ICD-10-CM

## 2016-05-03 DIAGNOSIS — Z794 Long term (current) use of insulin: Secondary | ICD-10-CM

## 2016-05-03 DIAGNOSIS — D649 Anemia, unspecified: Secondary | ICD-10-CM

## 2016-05-03 NOTE — Progress Notes (Signed)
Subjective:    Patient ID: [name], male    DOB: 1951-03-21, 65 y.o.   MRN: [id number]  HPI  65 year old patient who is seen today for follow-up of diabetes.  Basal insulin has been uptitrated to 52 units.  Fasting blood sugars are generally very well controlled and usually in the nineties.  Lowest fasting blood sugar 70.  No hypoglycemia Last hemoglobin A1c was elevated and he is now on mealtime insulin 4 units prior to breakfast and lunch and 8 units prior to dinner.  He feels that his glycemic control has greatly improved  He has been followed recently for weight loss anemia, elevated sedimentation rate associated with intermittent night sweats.  Symptoms greatly improved following treatment for H. Pylori gastritis.  Laboratory improvement also in anemia depressed albumin and improved.  Sedimentation rate.  .More recently he has felt that he has had a bit of a relapse with some weight loss and anorexia.  He still feels that his night sweats have improved.  He was seen at the urgent care 5 days ago and treated for a viral URI.  Chest x-ray was nonrevealing.  Over the past several months he has lost 3 brothers and a father-in-law.  One brother died of kidney car pneumonia.  Another brother died of postop pneumonia.  The third brother had poorly controlled diabetes and weight loss prior to his death.  Other details unclear  Lab Results  Component Value Date   HGBA1C 8.0 (H) 04/02/2016   \ Wt Readings from Last 3 Encounters:  05/03/16 148 lb (67.1 kg)  04/02/16 151 lb 1.6 oz (68.5 kg)  04/02/16 150 lb (68 kg)    Past Medical History:  Diagnosis Date  . Anemia   . Arthritis    joints,   . Cataract    removed both eyes  . COPD (chronic obstructive pulmonary disease) (HCC)    mild  . CVA (cerebral vascular accident) ([place]) 2005   hx of right brain subcortical stroke  . Diabetes mellitus type II   . H/O hiatal hernia   . Hodgkin's lymphoma ([place]) 2000   stage IV B status post  ABCD chemotherapy -2000  . Hyperlipidemia   . Hypertension    Dr. Vidal Schwalbe cardiology  . 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 Pneumonia Feb 2008  . Radiculopathy    S1  . Sinus congestion 06/19/11   pt has had sinus "infection" and just finished a z-pak  dr geiger aware  . Sleep apnea    npo cpap     Social History   Social History  . Marital status: Married    Spouse name: N/A  . Number of children: 2  . Years of education: N/A   Occupational History  . 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 Smoking status: Former Smoker    Years: 30.00    Quit date: 06/03/2004  . Smokeless tobacco: Never Used  . Alcohol use No     Comment: rarely  . Drug use: No  . Sexual activity: Not on file   Other Topics Concern  . Not on file   Social History Narrative   Lives with his wife.   Their children are adults and live in [place].    Past Surgical History:  Procedure Laterality Date  . 2-d echocardiogram  April 2002   ejection fraction 55 to 65%  .  CATARACT EXTRACTION W/PHACO  06/19/2011   Procedure: CATARACT EXTRACTION PHACO AND INTRAOCULAR LENS PLACEMENT (IOC);  Surgeon: Adonis Brook, MD;  Location: [facility];  Service: Ophthalmology;  Laterality: Right;  [place] FMA50BM +18.00 D Right  . COLONOSCOPY  2003  . lymph node biospy  2000    Family History  Problem Relation Age of Onset  . Diabetes Mother   . Heart disease Mother   . Heart failure Father   . Cancer Sister   . Lung cancer Sister   . Cancer Brother   . Lung cancer Brother   . Anesthesia problems Neg Hx   . Colon cancer Neg Hx   . Colon polyps Neg Hx   . Esophageal cancer Neg Hx     Allergies  Allergen Reactions  . Codeine Phosphate     Itching and gittery    Current Outpatient Prescriptions on File Prior to Visit  Medication Sig Dispense Refill  . aspirin 325 MG tablet Take 325 mg by  mouth daily.      Marland Kitchen atorvastatin (LIPITOR) 10 MG tablet TAKE 1 TABLET BY MOUTH ONCE DAILY 90 tablet 1  . benzonatate (TESSALON) 200 MG capsule Take 1 capsule (200 mg total) by mouth 3 (three) times daily. Prn cough 30 capsule 1  . Blood Glucose Monitoring Suppl (ONETOUCH VERIO IQ SYSTEM) w/Device KIT USE TO CHECK BLOOD SUGAR 1 kit 0  . Cholecalciferol (VITAMIN D3 PO) Take 1,000 Units by mouth 2 (two) times daily.     . clotrimazole (LOTRIMIN) 1 % cream Apply topically 2 (two) times daily. (Patient taking differently: Apply topically 2 (two) times daily. As needed) 30 g 0  . Dextromethorphan-Guaifenesin (MUCINEX DM MAXIMUM STRENGTH) 60-1200 MG TB12 Take 1 tablet by mouth 2 (two) times daily. Reported on 10/20/2015    . fexofenadine-pseudoephedrine (ALLEGRA-D) 60-120 MG per tablet Take 1 tablet by mouth daily as needed. For seasonal allergies    . fluticasone (FLONASE) 50 MCG/ACT nasal spray USE 2 SPRAYS IN EACH NOSTRIL ONCE DAILY 16 g 5  . glucose blood (ONETOUCH VERIO) test strip USE TO CHECK BLOOD SUGAR DAILY AND PRN 100 each 12  . Insulin Glargine (TOUJEO SOLOSTAR) 300 UNIT/ML SOPN Inject 30 Units into the skin at bedtime. (Patient taking differently: Inject 52 Units into the skin at bedtime. ) 1 pen 3  . insulin lispro (HUMALOG KWIKPEN) 100 UNIT/ML KiwkPen Administer 4 units prior to breakfast and lunch and 8 units prior to evening meal. 6 pen 3  . Insulin Pen Needle (B-D UF III MINI PEN NEEDLES) 31G X 5 MM MISC USE TWICE A DAY 100 each 5  . ipratropium (ATROVENT) 0.03 % nasal spray PLACE 2 SPRAYS INTO BOTH NOSTRILS 2 TIMES DAILY 30 mL 12  . KLOR-CON M20 20 MEQ tablet TAKE 1 TABLET BY MOUTH EVERY DAY 90 tablet 1  . LORazepam (ATIVAN) 0.5 MG tablet TAKE 2 TABLETS TWICE A DAY AS NEEDED 60 tablet 0  . losartan (COZAAR) 100 MG tablet TAKE 1 TABLET BY MOUTH ONCE DAILY 90 tablet 3  . metFORMIN (GLUCOPHAGE) 1000 MG tablet TAKE 1 TABLET BY MOUTH TWICE A DAY 180 tablet 4  . metoprolol (LOPRESSOR) 50 MG  tablet TAKE 1 TABLET BY MOUTH EVERY DAY 90 tablet 1  . montelukast (SINGULAIR) 10 MG tablet TAKE 1 TABLET BY MOUTH EVERY DAY AT BEDTIME 30 tablet 12  . Multiple Vitamins-Minerals (CENTRUM SILVER PO) Take 1 tablet by mouth daily.      . OMEGA 3 1200 MG  CAPS Take 1,200 mg by mouth daily.    . ONE TOUCH LANCETS MISC USE TO CHECK BLOOD SUGAR DAILY AND PRN 100 each 12  . terazosin (HYTRIN) 5 MG capsule TAKE 1 CAPSULE EVERY DAY 90 capsule 3  . Triamcinolone Acetonide (NASACORT AQ NA) Place 2 sprays into the nose as needed.     . TRULICITY 1.5 GE/9.5MW SOPN INJECT 1.5 MG INTO THE SKIN ONCE A WEEK. 4 pen 5  . VENTOLIN HFA 108 (90 Base) MCG/ACT inhaler INHALE 2 PUFFS INTO THE LUNGS EVERY 4 HOURS AS NEEDED FOR WHEEZING 18 Inhaler 2  . XIIDRA 5 % SOLN PLACE 1 DROP INTO EACH EYE TWICE A DAY  6   Current Facility-Administered Medications on File Prior to Visit  Medication Dose Route Frequency Provider Last Rate Last Dose  . 0.9 %  sodium chloride infusion  500 mL Intravenous Continuous Nelida Meuse III, MD        BP (!) 146/70 (BP Location: Left Arm, Patient Position: Sitting, Cuff Size: Normal)   Pulse 97   Temp 98.4 F (36.9 C) (Oral)   Resp 20   Ht 5' 5.5" (1.664 m)   Wt 148 lb (67.1 kg)   SpO2 95%   BMI 24.25 kg/m     Review of Systems  Constitutional: Positive for activity change, appetite change, diaphoresis, fatigue and unexpected weight change. Negative for chills and fever.  HENT: Negative for congestion, dental problem, ear pain, hearing loss, sore throat, tinnitus, trouble swallowing and voice change.   Eyes: Negative for pain, discharge and visual disturbance.  Respiratory: Negative for cough, chest tightness, wheezing and stridor.   Cardiovascular: Negative for chest pain, palpitations and leg swelling.  Gastrointestinal: Negative for abdominal distention, abdominal pain, blood in stool, constipation, diarrhea, nausea and vomiting.  Genitourinary: Negative for difficulty  urinating, discharge, flank pain, genital sores, hematuria and urgency.  Musculoskeletal: Negative for arthralgias, back pain, gait problem, joint swelling, myalgias and neck stiffness.  Skin: Negative for rash.  Neurological: Negative for dizziness, syncope, speech difficulty, weakness, numbness and headaches.  Hematological: Negative for adenopathy. Does not bruise/bleed easily.  Psychiatric/Behavioral: Negative for behavioral problems and dysphoric mood. The patient is not nervous/anxious.        Objective:   Physical Exam  Constitutional: He is oriented to person, place, and time. He appears well-developed.   Clinically, looks well Afebrile  HENT:  Head: Normocephalic.  Right Ear: External ear normal.  Left Ear: External ear normal.  Eyes: Conjunctivae and EOM are normal.  Neck: Normal range of motion.  Cardiovascular: Normal rate and normal heart sounds.   Pulmonary/Chest: Breath sounds normal.  Abdominal: Bowel sounds are normal.  Musculoskeletal: Normal range of motion. He exhibits no edema or tenderness.  Neurological: He is alert and oriented to person, place, and time.  Psychiatric: He has a normal mood and affect. His behavior is normal.          Assessment & Plan:   Diabetes mellitus.  Appears to be under much better control.  Will continue present regimen at this time.  Patient aware that he can down titrate basal insulin if needed.  Hopeful to de-escalate mealtime therapy in a couple months  History of H. Pylori gastritis  History of weight loss.  Present weight loss is modest and may be related to recent URI as well as situational stress.  Follow-up oncology as scheduled next week  Follow up 2 months  Julian Askin Pilar Plate

## 2016-05-03 NOTE — Patient Instructions (Signed)
Limit your sodium (Salt) intake   Please check your hemoglobin A1c every 3 months    It is important that you exercise regularly, at least 20 minutes 3 to 4 times per week.  If you develop chest pain or shortness of breath seek  medical attention.  Return in 2 months for follow-up

## 2016-05-06 ENCOUNTER — Telehealth: Payer: Self-pay | Admitting: Oncology

## 2016-05-06 ENCOUNTER — Ambulatory Visit (HOSPITAL_BASED_OUTPATIENT_CLINIC_OR_DEPARTMENT_OTHER): Payer: Medicare Other | Admitting: Oncology

## 2016-05-06 ENCOUNTER — Other Ambulatory Visit (HOSPITAL_BASED_OUTPATIENT_CLINIC_OR_DEPARTMENT_OTHER): Payer: Medicare Other

## 2016-05-06 VITALS — BP 133/68 | HR 95 | Temp 98.2°F | Resp 16 | Ht 65.5 in | Wt 147.6 lb

## 2016-05-06 DIAGNOSIS — C8190 Hodgkin lymphoma, unspecified, unspecified site: Secondary | ICD-10-CM | POA: Diagnosis not present

## 2016-05-06 DIAGNOSIS — E119 Type 2 diabetes mellitus without complications: Secondary | ICD-10-CM | POA: Diagnosis not present

## 2016-05-06 DIAGNOSIS — K2970 Gastritis, unspecified, without bleeding: Principal | ICD-10-CM

## 2016-05-06 DIAGNOSIS — B9681 Helicobacter pylori [H. pylori] as the cause of diseases classified elsewhere: Secondary | ICD-10-CM

## 2016-05-06 DIAGNOSIS — D649 Anemia, unspecified: Secondary | ICD-10-CM | POA: Diagnosis not present

## 2016-05-06 DIAGNOSIS — Z8571 Personal history of Hodgkin lymphoma: Secondary | ICD-10-CM

## 2016-05-06 DIAGNOSIS — D509 Iron deficiency anemia, unspecified: Secondary | ICD-10-CM

## 2016-05-06 LAB — CBC WITH DIFFERENTIAL/PLATELET
BASO%: 0.2 % (ref 0.0–2.0)
Basophils Absolute: 0 10*3/uL (ref 0.0–0.1)
EOS ABS: 0.1 10*3/uL (ref 0.0–0.5)
EOS%: 1.3 % (ref 0.0–7.0)
HEMATOCRIT: 28.1 % — AB (ref 38.4–49.9)
HEMOGLOBIN: 8.4 g/dL — AB (ref 13.0–17.1)
LYMPH#: 0.8 10*3/uL — AB (ref 0.9–3.3)
LYMPH%: 16.2 % (ref 14.0–49.0)
MCH: 23.3 pg — ABNORMAL LOW (ref 27.2–33.4)
MCHC: 29.9 g/dL — ABNORMAL LOW (ref 32.0–36.0)
MCV: 78.1 fL — ABNORMAL LOW (ref 79.3–98.0)
MONO#: 0.7 10*3/uL (ref 0.1–0.9)
MONO%: 14.9 % — ABNORMAL HIGH (ref 0.0–14.0)
NEUT%: 67.4 % (ref 39.0–75.0)
NEUTROS ABS: 3.2 10*3/uL (ref 1.5–6.5)
PLATELETS: 287 10*3/uL (ref 140–400)
RBC: 3.6 10*6/uL — AB (ref 4.20–5.82)
RDW: 16.5 % — AB (ref 11.0–14.6)
WBC: 4.8 10*3/uL (ref 4.0–10.3)

## 2016-05-06 LAB — IRON AND TIBC
%SAT: 7 % — AB (ref 20–55)
Iron: 16 ug/dL — ABNORMAL LOW (ref 42–163)
TIBC: 235 ug/dL (ref 202–409)
UIBC: 219 ug/dL (ref 117–376)

## 2016-05-06 LAB — FERRITIN: Ferritin: 310 ng/ml (ref 22–316)

## 2016-05-06 NOTE — Progress Notes (Signed)
  [facility] OFFICE PROGRESS NOTE   Diagnosis: History of Hodgkin's disease, anemia  INTERVAL HISTORY:   [name] returns as scheduled. He continues to feel "cold "abdominal discomfort initially improved after beginning treatment for H. pylori. The pain returned with this treatment was discontinued. He has not returned stool Hemoccult cards. He has urinary urgency/incontinence. No bleeding. He has a poor appetite. He has noted balance difficulty when walking.  Objective:  Vital signs in last 24 hours:  Blood pressure 133/68, pulse 95, temperature 98.2 F (36.8 C), temperature source Oral, resp. rate 16, height 5' 5.5" (1.664 m), weight 147 lb 9.6 oz (67 kg), SpO2 97 %.    HEENT: Neck without mass Lymphatics: No cervical, supraclavicular, axillary, or inguinal nodes Resp: Distant breath sounds, no respiratory distress Cardio: Regular rate and rhythm GI: Nontender, no hepatosplenomegaly Vascular: No leg edema Neuro: Finger to nose testing is normal, the gait is slightly unsteady    Lab Results:  Lab Results  Component Value Date   WBC 4.8 05/06/2016   HGB 8.4 (L) 05/06/2016   HCT 28.1 (L) 05/06/2016   MCV 78.1 (L) 05/06/2016   PLT 287 05/06/2016   NEUTROABS 3.2 05/06/2016     Medications: I have reviewed the patient's current medications.  Assessment/Plan: 1. Hodgkins lymphoma , stage IV, diagnosed in May 2000 - he remains in clinical remission. 2. History of a nodular skin rash with a biopsy August 02, 2006, confirming a CD30 positive lymphoproliferative disorder. He reports intermittent nodular skin lesions over the past few years that spontaneously resolve and occur months apart. No apparent lesions today. 3. Anemia  02/29/2016 iron studies consistent with anemia of chronic disease  Bone marrow biopsy 03/05/2016-hypercellular bone marrow with trilineage hematopoiesis. Increased number of megakaryocytes many of which show atypical forms. Erythroid and  granulocytic series with subtle essentially nonspecific changes. Abundant iron stores Cytogenetic analysis with normal findings. 4. Anorexia/weight loss 5. Diabetes 6. 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 Biopsy gastric body showed chronic helicobacter pylori gastritis; Warthin-starry stain positive for Helicobacter pylori; no intestinal metaplasia, dysplasia or malignancy. Treatment initiated per Dr. Loletha Carrow.   Disposition:  [name] has persistent microcytic anemia. The ferritin was normal in late September and the bone marrow can adequate iron stores. However his MCV has not been low in the past. He either has the "anemia of chronic disease "or iron deficiency. He will begin empiric trial of ferrous sulfate. He will return for a CBC on 05/10/2016. [name] will be out of town for the holidays. He will return for an office visit here 06/11/2015.  He will return in stool Hemoccult cards Dr. Loletha Carrow next week. He will schedule upon with Dr. Loletha Carrow to discuss the abdominal discomfort after treatment for H. pylori.  Betsy Coder, MD  05/06/2016  12:23 PM

## 2016-05-06 NOTE — Telephone Encounter (Signed)
Appointments scheduled per 05/06/16 los. A copy of the AVS report and appointment schedule was given to the patient, per 05/06/16 los. °

## 2016-05-07 LAB — SEDIMENTATION RATE: SED RATE: 112 mm/h — AB (ref 0–30)

## 2016-05-09 ENCOUNTER — Other Ambulatory Visit: Payer: Self-pay | Admitting: Internal Medicine

## 2016-05-10 ENCOUNTER — Other Ambulatory Visit (HOSPITAL_BASED_OUTPATIENT_CLINIC_OR_DEPARTMENT_OTHER): Payer: Medicare Other

## 2016-05-10 DIAGNOSIS — D649 Anemia, unspecified: Secondary | ICD-10-CM

## 2016-05-10 DIAGNOSIS — Z8571 Personal history of Hodgkin lymphoma: Secondary | ICD-10-CM | POA: Diagnosis present

## 2016-05-10 DIAGNOSIS — D509 Iron deficiency anemia, unspecified: Secondary | ICD-10-CM | POA: Diagnosis not present

## 2016-05-10 LAB — CBC WITH DIFFERENTIAL/PLATELET
BASO%: 1.1 % (ref 0.0–2.0)
BASOS ABS: 0 10*3/uL (ref 0.0–0.1)
EOS ABS: 0.3 10*3/uL (ref 0.0–0.5)
EOS%: 5.6 % (ref 0.0–7.0)
HCT: 30.7 % — ABNORMAL LOW (ref 38.4–49.9)
HEMOGLOBIN: 9.3 g/dL — AB (ref 13.0–17.1)
LYMPH%: 18.5 % (ref 14.0–49.0)
MCH: 23 pg — AB (ref 27.2–33.4)
MCHC: 30.1 g/dL — ABNORMAL LOW (ref 32.0–36.0)
MCV: 76.3 fL — AB (ref 79.3–98.0)
MONO#: 0.9 10*3/uL (ref 0.1–0.9)
MONO%: 19.5 % — AB (ref 0.0–14.0)
NEUT#: 2.5 10*3/uL (ref 1.5–6.5)
NEUT%: 55.3 % (ref 39.0–75.0)
Platelets: 380 10*3/uL (ref 140–400)
RBC: 4.03 10*6/uL — AB (ref 4.20–5.82)
RDW: 18.4 % — AB (ref 11.0–14.6)
WBC: 4.6 10*3/uL (ref 4.0–10.3)
lymph#: 0.8 10*3/uL — ABNORMAL LOW (ref 0.9–3.3)

## 2016-05-13 ENCOUNTER — Other Ambulatory Visit: Payer: Medicare Other

## 2016-05-13 DIAGNOSIS — A048 Other specified bacterial intestinal infections: Secondary | ICD-10-CM

## 2016-05-14 ENCOUNTER — Other Ambulatory Visit (INDEPENDENT_AMBULATORY_CARE_PROVIDER_SITE_OTHER): Payer: Medicare Other

## 2016-05-14 ENCOUNTER — Other Ambulatory Visit: Payer: Self-pay

## 2016-05-14 ENCOUNTER — Other Ambulatory Visit: Payer: Self-pay | Admitting: Internal Medicine

## 2016-05-14 DIAGNOSIS — H43813 Vitreous degeneration, bilateral: Secondary | ICD-10-CM | POA: Diagnosis not present

## 2016-05-14 DIAGNOSIS — A048 Other specified bacterial intestinal infections: Secondary | ICD-10-CM

## 2016-05-14 DIAGNOSIS — H04123 Dry eye syndrome of bilateral lacrimal glands: Secondary | ICD-10-CM | POA: Diagnosis not present

## 2016-05-14 LAB — HEMOCCULT SLIDES (X 3 CARDS)
Fecal Occult Blood: NEGATIVE
OCCULT 1: NEGATIVE
OCCULT 2: NEGATIVE
OCCULT 3: NEGATIVE
OCCULT 4: NEGATIVE
OCCULT 5: NEGATIVE

## 2016-05-14 LAB — HELICOBACTER PYLORI  SPECIAL ANTIGEN: H. PYLORI Antigen: NOT DETECTED

## 2016-05-15 NOTE — Progress Notes (Signed)
That would be fine if he finds it helpful. Perhaps try it every other day and see if same results

## 2016-06-10 ENCOUNTER — Other Ambulatory Visit: Payer: Self-pay | Admitting: Internal Medicine

## 2016-06-10 ENCOUNTER — Telehealth: Payer: Self-pay | Admitting: Oncology

## 2016-06-10 ENCOUNTER — Ambulatory Visit (HOSPITAL_BASED_OUTPATIENT_CLINIC_OR_DEPARTMENT_OTHER): Payer: Medicare Other | Admitting: Oncology

## 2016-06-10 ENCOUNTER — Other Ambulatory Visit (HOSPITAL_BASED_OUTPATIENT_CLINIC_OR_DEPARTMENT_OTHER): Payer: Medicare Other

## 2016-06-10 VITALS — BP 158/73 | HR 90 | Temp 98.5°F | Resp 18 | Ht 65.5 in | Wt 154.0 lb

## 2016-06-10 DIAGNOSIS — E119 Type 2 diabetes mellitus without complications: Secondary | ICD-10-CM

## 2016-06-10 DIAGNOSIS — Z8571 Personal history of Hodgkin lymphoma: Secondary | ICD-10-CM | POA: Diagnosis not present

## 2016-06-10 DIAGNOSIS — D649 Anemia, unspecified: Secondary | ICD-10-CM

## 2016-06-10 LAB — CBC WITH DIFFERENTIAL/PLATELET
BASO%: 0.6 % (ref 0.0–2.0)
Basophils Absolute: 0 10*3/uL (ref 0.0–0.1)
EOS%: 1.9 % (ref 0.0–7.0)
Eosinophils Absolute: 0.1 10*3/uL (ref 0.0–0.5)
HCT: 31.7 % — ABNORMAL LOW (ref 38.4–49.9)
HGB: 9.7 g/dL — ABNORMAL LOW (ref 13.0–17.1)
LYMPH#: 1 10*3/uL (ref 0.9–3.3)
LYMPH%: 18.7 % (ref 14.0–49.0)
MCH: 24.6 pg — ABNORMAL LOW (ref 27.2–33.4)
MCHC: 30.6 g/dL — AB (ref 32.0–36.0)
MCV: 80.5 fL (ref 79.3–98.0)
MONO#: 0.9 10*3/uL (ref 0.1–0.9)
MONO%: 16.6 % — ABNORMAL HIGH (ref 0.0–14.0)
NEUT#: 3.3 10*3/uL (ref 1.5–6.5)
NEUT%: 62.2 % (ref 39.0–75.0)
Platelets: 282 10*3/uL (ref 140–400)
RBC: 3.94 10*6/uL — AB (ref 4.20–5.82)
RDW: 18.4 % — ABNORMAL HIGH (ref 11.0–14.6)
WBC: 5.3 10*3/uL (ref 4.0–10.3)

## 2016-06-10 NOTE — Telephone Encounter (Signed)
Appointments scheduled per 1/8 LOS. Patient given AVS report and calendars with future scheduled appointments.  °

## 2016-06-10 NOTE — Progress Notes (Signed)
  [facility] OFFICE PROGRESS NOTE   Diagnosis: Anemia, history of Hodgkin's disease  INTERVAL HISTORY:   Mr. Raucci returns as scheduled. Stool Hemoccult cards returned negative last month. He continues to have malaise. No bleeding. Abdominal pain improved when he resumed Prilosec. No fever or night sweats.  Objective:  Vital signs in last 24 hours:  Blood pressure (!) 158/73, pulse 90, temperature 98.5 F (36.9 C), temperature source Oral, resp. rate 18, height 5' 5.5" (1.664 m), weight 154 lb (69.9 kg), SpO2 97 %.    HEENT: Neck without mass Lymphatics: No cervical, supra-clavicular, axillary, or inguinal nodes Resp: Distant breath sounds, no respiratory distress Cardio: Regular rate and rhythm GI: No hepatosplenomegaly, no mass, nontender Vascular: No leg edema  Skin: Light brown plaques over the back    Lab Results:  Lab Results  Component Value Date   WBC 5.3 06/10/2016   HGB 9.7 (L) 06/10/2016   HCT 31.7 (L) 06/10/2016   MCV 80.5 06/10/2016   PLT 282 06/10/2016   NEUTROABS 3.3 06/10/2016     Medications: I have reviewed the patient's current medications.  Assessment/Plan: 1. Hodgkins lymphoma , stage IV, diagnosed in May 2000 - he remains in clinical remission. 2. History of a nodular skin rash with a biopsy August 02, 2006, confirming a CD30 positive lymphoproliferative disorder. He reports intermittent nodular skin lesions over the past few years that spontaneously resolve and occur months apart. No apparent lesions today. 3. Anemia  02/29/2016 iron studies consistent with anemia of chronic disease  Bone marrow biopsy 03/05/2016-hypercellular bone marrow with trilineage hematopoiesis. Increased number of megakaryocytes many of which show atypical forms. Erythroid and granulocytic series with subtle essentially nonspecific changes. Abundant iron stores Cytogenetic analysis with normal findings. 4. Anorexia/weight  loss-improved 5. Diabetes 6. 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 Treatment initiated per Dr. Loletha Carrow.     Disposition:  Mr. Erbes appears partially improved. The hemoglobin is higher today and he has gained weight. The etiology of the anemia remains unclear. The erythrocyte sedimentation rate is elevated. There has been no clinical or x-ray, or bone marrow evidence for recurrence of Hodgkin's lymphoma or another malignancy.  He will continue iron. Mr. Ambrosini will return for an office visit in one month. He will also see Dr. Burnice Logan next month.  We will consider a rheumatologic workup and additional imaging if the anemia and malaise persists.  Betsy Coder, MD  06/10/2016  12:04 PM

## 2016-06-11 ENCOUNTER — Other Ambulatory Visit: Payer: Self-pay | Admitting: Internal Medicine

## 2016-06-11 DIAGNOSIS — D638 Anemia in other chronic diseases classified elsewhere: Secondary | ICD-10-CM

## 2016-06-11 DIAGNOSIS — R634 Abnormal weight loss: Secondary | ICD-10-CM

## 2016-06-11 DIAGNOSIS — R700 Elevated erythrocyte sedimentation rate: Secondary | ICD-10-CM

## 2016-06-25 ENCOUNTER — Ambulatory Visit (HOSPITAL_COMMUNITY)
Admission: EM | Admit: 2016-06-25 | Discharge: 2016-06-25 | Disposition: A | Discharge status: Home / Self Care | Payer: Medicare Other | Attending: Family Medicine | Admitting: Family Medicine

## 2016-06-25 ENCOUNTER — Ambulatory Visit (INDEPENDENT_AMBULATORY_CARE_PROVIDER_SITE_OTHER): Payer: Medicare Other

## 2016-06-25 ENCOUNTER — Encounter (HOSPITAL_COMMUNITY): Payer: Self-pay | Admitting: Emergency Medicine

## 2016-06-25 DIAGNOSIS — Z8249 Family history of ischemic heart disease and other diseases of the circulatory system: Secondary | ICD-10-CM | POA: Insufficient documentation

## 2016-06-25 DIAGNOSIS — Z7982 Long term (current) use of aspirin: Secondary | ICD-10-CM | POA: Diagnosis not present

## 2016-06-25 DIAGNOSIS — E785 Hyperlipidemia, unspecified: Secondary | ICD-10-CM | POA: Insufficient documentation

## 2016-06-25 DIAGNOSIS — Z801 Family history of malignant neoplasm of trachea, bronchus and lung: Secondary | ICD-10-CM | POA: Diagnosis not present

## 2016-06-25 DIAGNOSIS — Z79899 Other long term (current) drug therapy: Secondary | ICD-10-CM | POA: Insufficient documentation

## 2016-06-25 DIAGNOSIS — R05 Cough: Secondary | ICD-10-CM | POA: Diagnosis not present

## 2016-06-25 DIAGNOSIS — E119 Type 2 diabetes mellitus without complications: Secondary | ICD-10-CM | POA: Insufficient documentation

## 2016-06-25 DIAGNOSIS — Z8673 Personal history of transient ischemic attack (TIA), and cerebral infarction without residual deficits: Secondary | ICD-10-CM | POA: Insufficient documentation

## 2016-06-25 DIAGNOSIS — Z8571 Personal history of Hodgkin lymphoma: Secondary | ICD-10-CM | POA: Diagnosis not present

## 2016-06-25 DIAGNOSIS — Z87891 Personal history of nicotine dependence: Secondary | ICD-10-CM | POA: Insufficient documentation

## 2016-06-25 DIAGNOSIS — R69 Illness, unspecified: Secondary | ICD-10-CM

## 2016-06-25 DIAGNOSIS — Z794 Long term (current) use of insulin: Secondary | ICD-10-CM | POA: Diagnosis not present

## 2016-06-25 DIAGNOSIS — G4730 Sleep apnea, unspecified: Secondary | ICD-10-CM | POA: Diagnosis not present

## 2016-06-25 DIAGNOSIS — R509 Fever, unspecified: Secondary | ICD-10-CM | POA: Diagnosis not present

## 2016-06-25 DIAGNOSIS — R059 Cough, unspecified: Secondary | ICD-10-CM

## 2016-06-25 DIAGNOSIS — J449 Chronic obstructive pulmonary disease, unspecified: Secondary | ICD-10-CM | POA: Insufficient documentation

## 2016-06-25 DIAGNOSIS — I10 Essential (primary) hypertension: Secondary | ICD-10-CM | POA: Diagnosis not present

## 2016-06-25 DIAGNOSIS — Z833 Family history of diabetes mellitus: Secondary | ICD-10-CM | POA: Diagnosis not present

## 2016-06-25 DIAGNOSIS — Z9221 Personal history of antineoplastic chemotherapy: Secondary | ICD-10-CM | POA: Diagnosis not present

## 2016-06-25 DIAGNOSIS — J111 Influenza due to unidentified influenza virus with other respiratory manifestations: Secondary | ICD-10-CM

## 2016-06-25 LAB — POCT I-STAT, CHEM 8
BUN: 14 mg/dL (ref 6–20)
Calcium, Ion: 1.22 mmol/L (ref 1.15–1.40)
Chloride: 97 mmol/L — ABNORMAL LOW (ref 101–111)
Creatinine, Ser: 1.1 mg/dL (ref 0.61–1.24)
Glucose, Bld: 206 mg/dL — ABNORMAL HIGH (ref 65–99)
HEMATOCRIT: 31 % — AB (ref 39.0–52.0)
HEMOGLOBIN: 10.5 g/dL — AB (ref 13.0–17.0)
Potassium: 4.1 mmol/L (ref 3.5–5.1)
SODIUM: 137 mmol/L (ref 135–145)
TCO2: 29 mmol/L (ref 0–100)

## 2016-06-25 LAB — POCT RAPID STREP A: STREPTOCOCCUS, GROUP A SCREEN (DIRECT): NEGATIVE

## 2016-06-25 MED ORDER — AZITHROMYCIN 250 MG PO TABS: 250.0000 mg | ORAL_TABLET | Freq: Every day | ORAL | 0 refills | Status: DC

## 2016-06-25 MED ORDER — ACETAMINOPHEN 325 MG PO TABS
650.0000 mg | ORAL_TABLET | Freq: Once | ORAL | Status: AC
  Administered 2016-06-25: 650 mg via ORAL

## 2016-06-25 MED ORDER — ACETAMINOPHEN 325 MG PO TABS
ORAL_TABLET | ORAL | Status: AC
  Filled 2016-06-25: qty 2

## 2016-06-25 MED ORDER — BENZONATATE 200 MG PO CAPS: 200.0000 mg | ORAL_CAPSULE | Freq: Three times a day (TID) | ORAL | 1 refills | Status: DC

## 2016-06-25 MED ORDER — OSELTAMIVIR PHOSPHATE 75 MG PO CAPS
75.0000 mg | ORAL_CAPSULE | Freq: Two times a day (BID) | ORAL | 0 refills | Status: DC
Start: 2016-06-25 — End: 2016-07-11

## 2016-06-25 MED ORDER — BENZONATATE 200 MG PO CAPS
200.0000 mg | ORAL_CAPSULE | Freq: Three times a day (TID) | ORAL | 1 refills | Status: DC
Start: 2016-06-25 — End: 2016-07-18

## 2016-06-25 NOTE — ED Provider Notes (Signed)
CSN: [id number]     Arrival date & time 06/25/16  1636 History   First MD Initiated Contact with Patient 06/25/16 1759     Chief Complaint  Patient presents with  . Cough   (Consider location/radiation/quality/duration/timing/severity/associated sxs/prior Treatment) Patien c/o cough and fever over last 2 days.  He has been feeling tired and has been sleeping more.   The history is provided by the patient and the spouse.  Cough  Cough characteristics:  Non-productive Sputum characteristics:  White Severity:  Moderate Onset quality:  Sudden Duration:  2 days Timing:  Constant Progression:  Waxing and waning Chronicity:  New Smoker: no   Context: upper respiratory infection and weather changes   Relieved by:  Nothing Worsened by:  Nothing Ineffective treatments:  Beta-agonist inhaler Associated symptoms: wheezing     Past Medical History:  Diagnosis Date  . Anemia   . Arthritis    joints,   . Cataract    removed both eyes  . COPD (chronic obstructive pulmonary disease) (HCC)    mild  . CVA (cerebral vascular accident) ([place]) 2005   hx of right brain subcortical stroke  . Diabetes mellitus type II   . H/O hiatal hernia   . Hodgkin's lymphoma ([place]) 2000   stage IV B status post ABCD chemotherapy -2000  . Hyperlipidemia   . Hypertension    Dr. Vidal Schwalbe cardiology  . 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 Pneumonia Feb 2008  . Radiculopathy    S1  . Sinus congestion 06/19/11   pt has had sinus "infection" and just finished a z-pak  dr geiger aware  . Sleep apnea    npo cpap   Past Surgical History:  Procedure Laterality Date  . 2-d echocardiogram  April 2002   ejection fraction 55 to 65%  . CATARACT EXTRACTION W/PHACO  06/19/2011   Procedure: CATARACT EXTRACTION PHACO AND INTRAOCULAR LENS PLACEMENT (IOC);  Surgeon: Adonis Brook, MD;  Location: [facility];  Service: Ophthalmology;  Laterality: Right;  [place] FMA50BM +18.00  D Right  . COLONOSCOPY  2003  . lymph node biospy  2000   Family History  Problem Relation Age of Onset  . Diabetes Mother   . Heart disease Mother   . Heart failure Father   . Cancer Sister   . Lung cancer Sister   . Cancer Brother   . Lung cancer Brother   . Anesthesia problems Neg Hx   . Colon cancer Neg Hx   . Colon polyps Neg Hx   . Esophageal cancer Neg Hx    Social History  Substance Use Topics  . Smoking status: Former Smoker    Years: 30.00    Quit date: 06/03/2004  . Smokeless tobacco: Never Used  . Alcohol use No     Comment: rarely    Review of Systems  Constitutional: Positive for fatigue.  HENT: Negative.   Eyes: Negative.   Respiratory: Positive for cough and wheezing.   Cardiovascular: Negative.   Gastrointestinal: Negative.   Endocrine: Negative.   Genitourinary: Negative.   Musculoskeletal: Negative.   Allergic/Immunologic: Negative.   Neurological: Negative.   Hematological: Negative.   Psychiatric/Behavioral: Negative.     Allergies  Codeine phosphate  Home Medications   Prior to Admission medications   Medication Sig Start Date End Date Taking? Authorizing Provider  aspirin 325 MG tablet Take 325 mg by mouth daily.   Yes Historical Provider, MD  atorvastatin (LIPITOR) 10 MG  tablet TAKE 1 TABLET EVERY DAY 05/14/16  Yes Marletta Lor, MD  Cholecalciferol (VITAMIN D3 PO) Take 1,000 Units by mouth 2 (two) times daily.    Yes Historical Provider, MD  ferrous sulfate 325 (65 FE) MG tablet Take 325 mg by mouth 2 (two) times daily with a meal.   Yes Historical Provider, MD  fexofenadine (ALLEGRA) 30 MG tablet Take 30 mg by mouth 2 (two) times daily.   Yes Historical Provider, MD  Insulin Glargine (TOUJEO SOLOSTAR) 300 UNIT/ML SOPN Inject 30 Units into the skin at bedtime. Patient taking differently: Inject 52 Units into the skin at bedtime.  01/30/16  Yes Marletta Lor, MD  insulin lispro (HUMALOG KWIKPEN) 100 UNIT/ML KiwkPen Administer  4 units prior to breakfast and lunch and 8 units prior to evening meal. 04/03/16  Yes Marletta Lor, MD  Insulin Pen Needle (B-D UF III MINI PEN NEEDLES) 31G X 5 MM MISC USE TWICE A DAY 01/22/16  Yes Marletta Lor, MD  ipratropium (ATROVENT) 0.03 % nasal spray PLACE 2 SPRAYS INTO BOTH NOSTRILS 2 TIMES DAILY 12/14/15  Yes Chelle Jeffery, PA-C  KLOR-CON M20 20 MEQ tablet TAKE 1 TABLET BY MOUTH EVERY DAY 12/25/15  Yes Marletta Lor, MD  LORazepam (ATIVAN) 0.5 MG tablet TAKE 2 TABLETS TWICE A DAY AS NEEDED 01/09/16  Yes Marletta Lor, MD  losartan (COZAAR) 100 MG tablet TAKE 1 TABLET BY MOUTH ONCE DAILY 11/16/15  Yes Marletta Lor, MD  metFORMIN (GLUCOPHAGE) 1000 MG tablet TAKE 1 TABLET BY MOUTH TWICE A DAY 06/19/15  Yes Marin Olp, MD  metoprolol (LOPRESSOR) 50 MG tablet TAKE 1 TABLET BY MOUTH EVERY DAY 10/09/15  Yes Marletta Lor, MD  montelukast (SINGULAIR) 10 MG tablet TAKE 1 TABLET BY MOUTH EVERY DAY AT BEDTIME 03/21/16  Yes Marletta Lor, MD  Multiple Vitamins-Minerals (CENTRUM SILVER PO) Take 1 tablet by mouth daily.     Yes Historical Provider, MD  OMEGA 3 1200 MG CAPS Take 1,000 mg by mouth daily.    Yes Historical Provider, MD  terazosin (HYTRIN) 5 MG capsule TAKE 1 CAPSULE EVERY DAY 05/09/16  Yes Marletta Lor, MD  Triamcinolone Acetonide (NASACORT AQ NA) Place 2 sprays into the nose as needed.    Yes Historical Provider, MD  VENTOLIN HFA 108 (90 Base) MCG/ACT inhaler INHALE 2 PUFFS INTO THE LUNGS EVERY 4 HOURS AS NEEDED FOR WHEEZING 03/19/16  Yes Marletta Lor, MD  XIIDRA 5 % SOLN PLACE 1 DROP INTO EACH EYE TWICE A DAY 05/02/15  Yes Historical Provider, MD  azithromycin (ZITHROMAX) 250 MG tablet Take 1 tablet (250 mg total) by mouth daily. Take first 2 tablets together, then 1 every day until finished. 06/25/16   Lysbeth Penner, FNP  benzonatate (TESSALON) 200 MG capsule Take 1 capsule (200 mg total) by mouth 3 (three) times daily. Prn cough  06/25/16   Lysbeth Penner, FNP  Blood Glucose Monitoring Suppl [facility] VERIO IQ SYSTEM) w/Device KIT USE TO CHECK BLOOD SUGAR 10/12/15   Marletta Lor, MD  clotrimazole (LOTRIMIN) 1 % cream Apply topically 2 (two) times daily. Patient not taking: Reported on 06/10/2016 12/14/12   Marletta Lor, MD  Dextromethorphan-Guaifenesin [facility] DM MAXIMUM STRENGTH) 60-1200 MG TB12 Take 1 tablet by mouth 2 (two) times daily. Reported on 10/20/2015    Historical Provider, MD  fluticasone (FLONASE) 50 MCG/ACT nasal spray USE 2 SPRAYS IN [facility] NOSTRIL ONCE DAILY 04/01/16   Collier Salina  Sherwood Gambler, MD  glucose blood [facility] VERIO) test strip USE TO CHECK BLOOD SUGAR DAILY AND PRN 10/12/15   Marletta Lor, MD  omeprazole (PRILOSEC) 20 MG capsule Take 20 mg by mouth daily.    Historical Provider, MD  ONE TOUCH LANCETS MISC USE TO CHECK BLOOD SUGAR DAILY AND PRN 10/12/15   Marletta Lor, MD  oseltamivir (TAMIFLU) 75 MG capsule Take 1 capsule (75 mg total) by mouth every 12 (twelve) hours. 06/25/16   Lysbeth Penner, FNP  TRULICITY 1.5 VQ/2.5ZD SOPN INJECT 1.5 MG INTO THE SKIN ONCE A WEEK. 06/10/16   Marletta Lor, MD   Meds Ordered and Administered this Visit   Medications  acetaminophen (TYLENOL) tablet 650 mg (650 mg Oral Given 06/25/16 1811)    BP 168/83 (BP Location: Left Arm)   Pulse 109   Temp 101.8 F (38.8 C) (Oral)   Resp 22   SpO2 94%  No data found.   Physical Exam  Constitutional: He is oriented to person, place, and time. He appears well-developed and well-nourished.  HENT:  Head: Normocephalic and atraumatic.  Right Ear: External ear normal.  Left Ear: External ear normal.  Mouth/Throat: Oropharynx is clear and moist.  Eyes: Conjunctivae and EOM are normal. Pupils are equal, round, and reactive to light.  Neck: Normal range of motion. Neck supple.  Cardiovascular: Normal rate, regular rhythm and normal heart sounds.   Pulmonary/Chest: Effort normal and breath  sounds normal.  Abdominal: Soft. Bowel sounds are normal.  Neurological: He is alert and oriented to person, place, and time.  Nursing note and vitals reviewed.   Urgent Care Course     Procedures (including critical care time)  Labs Review Labs Reviewed  POCT I-STAT, CHEM 8 - Abnormal; Notable for the following:       Result Value   Chloride 97 (*)    Glucose, Bld 206 (*)    Hemoglobin 10.5 (*)    HCT 31.0 (*)    All other components within normal limits  POCT RAPID STREP A    Imaging Review Dg Chest 2 View  Result Date: 06/25/2016 CLINICAL DATA:  Cough x2 days with chest congestion.  Former smoker. EXAM: CHEST  2 VIEW COMPARISON:  04/28/2016 CXR and 07/21/2006 chest CT FINDINGS: Emphysematous hyperinflation of the lungs. Chronic bronchitic change noted with increased interstitial lung markings bilaterally. No alveolar consolidation, effusion or pneumothorax. Left costophrenic angle is excluded on the frontal view. Heart is normal in size. There is aortic atherosclerosis. Bone island of the left anterior fifth rib simulating a nodule is seen in the left mid lung, confirmed by prior 2008 CT. IMPRESSION: Emphysematous hyperinflation of the lungs. Aortic atherosclerosis. Mild interstitial prominence noted bilaterally likely representing chronic bronchitic change. Electronically Signed   By: Ashley Royalty M.D.   On: 06/25/2016 18:41     Visual Acuity Review  Right Eye Distance:   Left Eye Distance:   Bilateral Distance:    Right Eye Near:   Left Eye Near:    Bilateral Near:         MDM   1. Cough   2. Influenza-like illness   3. Fever and chills   Tylenol 612m po now Zpak Tamiflu Tessalon  Push po fluids, rest, tylenol and motrin otc prn as directed for fever, arthralgias, and myalgias.  Follow up prn if sx's continue or persist.  Follow up with PCP      WLysbeth Penner FNP 06/25/16 [id number]

## 2016-06-25 NOTE — ED Triage Notes (Signed)
Cough for 2-3 days, wheezing, no fever.  Patient has been sleeping a lot more than usual

## 2016-06-28 LAB — CULTURE, GROUP A STREP (THRC)

## 2016-07-04 ENCOUNTER — Other Ambulatory Visit: Payer: Self-pay | Admitting: Internal Medicine

## 2016-07-05 ENCOUNTER — Ambulatory Visit (INDEPENDENT_AMBULATORY_CARE_PROVIDER_SITE_OTHER): Payer: Medicare Other | Admitting: Internal Medicine

## 2016-07-05 ENCOUNTER — Encounter: Payer: Self-pay | Admitting: Internal Medicine

## 2016-07-05 VITALS — BP 130/68 | HR 80 | Temp 98.2°F | Ht 65.0 in | Wt 153.6 lb

## 2016-07-05 DIAGNOSIS — I10 Essential (primary) hypertension: Secondary | ICD-10-CM

## 2016-07-05 DIAGNOSIS — Z794 Long term (current) use of insulin: Secondary | ICD-10-CM | POA: Diagnosis not present

## 2016-07-05 DIAGNOSIS — R700 Elevated erythrocyte sedimentation rate: Secondary | ICD-10-CM | POA: Insufficient documentation

## 2016-07-05 DIAGNOSIS — D649 Anemia, unspecified: Secondary | ICD-10-CM

## 2016-07-05 DIAGNOSIS — E1359 Other specified diabetes mellitus with other circulatory complications: Secondary | ICD-10-CM | POA: Diagnosis not present

## 2016-07-05 DIAGNOSIS — R2681 Unsteadiness on feet: Secondary | ICD-10-CM | POA: Diagnosis not present

## 2016-07-05 LAB — HEMOGLOBIN A1C: HEMOGLOBIN A1C: 6.5 % (ref 4.6–6.5)

## 2016-07-05 LAB — SEDIMENTATION RATE: Sed Rate: 26 mm/hr — ABNORMAL HIGH (ref 0–20)

## 2016-07-05 MED ORDER — INSULIN PEN NEEDLE 31G X 5 MM MISC: 5 refills | Status: AC

## 2016-07-05 NOTE — Progress Notes (Signed)
Subjective:    Patient ID: [name], male    DOB: 1950-12-07, 66 y.o.   MRN: [id number]  HPI  66 year old patient who is seen today for follow-up of type 2 diabetes.  He is now on basal bolus insulin with much improved glycemic control  Lab Results  Component Value Date   HGBA1C 8.0 (H) 04/02/2016   Presently is on 52 units of basal insulin and 4 units prior to breakfast and 8 units prior to lunch and dinner  Since his last visit he has had some modest weight gain.  His last sedimentation rate remains quite elevated.  He is scheduled for rheumatologic consultation later this month.  This was rescheduled due to inclement weather.  He continues to have rare night sweats but these seem improved.  Overall his sense of well-being is improved with better appetite. He does continue to have some daytime sleepiness (, history of OSA), but in general doing better.  Denies the any significant musculoskeletal complaints  He does complain of some general unsteadiness but has not had any falls.  He states that he frequently feels off balance.  Denies any headache or other focal neurological symptoms  Past Medical History:  Diagnosis Date  . Anemia   . Arthritis    joints,   . Cataract    removed both eyes  . COPD (chronic obstructive pulmonary disease) (HCC)    mild  . CVA (cerebral vascular accident) ([place]) 2005   hx of right brain subcortical stroke  . Diabetes mellitus type II   . H/O hiatal hernia   . Hodgkin's lymphoma ([place]) 2000   stage IV B status post ABCD chemotherapy -2000  . Hyperlipidemia   . Hypertension    Dr. Vidal Schwalbe cardiology  . 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 Pneumonia Feb 2008  . Radiculopathy    S1  . Sinus congestion 06/19/11   pt has had sinus "infection" and just finished a z-pak  dr geiger aware  . Sleep apnea    npo cpap     Social History   Social History  . Marital status: Married    Spouse name:  N/A  . Number of children: 2  . Years of education: N/A   Occupational History  . 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 Smoking status: Former Smoker    Years: 30.00    Quit date: 06/03/2004  . Smokeless tobacco: Never Used  . Alcohol use No     Comment: rarely  . Drug use: No  . Sexual activity: Not on file   Other Topics Concern  . Not on file   Social History Narrative   Lives with his wife.   Their children are adults and live in [place].    Past Surgical History:  Procedure Laterality Date  . 2-d echocardiogram  April 2002   ejection fraction 55 to 65%  . CATARACT EXTRACTION W/PHACO  06/19/2011   Procedure: CATARACT EXTRACTION PHACO AND INTRAOCULAR LENS PLACEMENT (IOC);  Surgeon: Adonis Brook, MD;  Location: [facility];  Service: Ophthalmology;  Laterality: Right;  [place] FMA50BM +18.00 D Right  . COLONOSCOPY  2003  . lymph node biospy  2000    Family History  Problem Relation Age of Onset  . Diabetes Mother   . Heart disease Mother   . Heart failure Father   . Cancer Sister   .  Lung cancer Sister   . Cancer Brother   . Lung cancer Brother   . Anesthesia problems Neg Hx   . Colon cancer Neg Hx   . Colon polyps Neg Hx   . Esophageal cancer Neg Hx     Allergies  Allergen Reactions  . Codeine Phosphate     Itching and gittery    Current Outpatient Prescriptions on File Prior to Visit  Medication Sig Dispense Refill  . aspirin 325 MG tablet Take 325 mg by mouth daily.    Marland Kitchen atorvastatin (LIPITOR) 10 MG tablet TAKE 1 TABLET EVERY DAY 90 tablet 2  . azithromycin (ZITHROMAX) 250 MG tablet Take 1 tablet (250 mg total) by mouth daily. Take first 2 tablets together, then 1 every day until finished. 6 tablet 0  . benzonatate (TESSALON) 200 MG capsule Take 1 capsule (200 mg total) by mouth 3 (three) times daily. Prn cough 30 capsule 1  . Blood Glucose Monitoring Suppl (ONETOUCH VERIO IQ SYSTEM)  w/Device KIT USE TO CHECK BLOOD SUGAR 1 kit 0  . Cholecalciferol (VITAMIN D3 PO) Take 1,000 Units by mouth 2 (two) times daily.     . clotrimazole (LOTRIMIN) 1 % cream Apply topically 2 (two) times daily. 30 g 0  . Dextromethorphan-Guaifenesin (MUCINEX DM MAXIMUM STRENGTH) 60-1200 MG TB12 Take 1 tablet by mouth 2 (two) times daily. Reported on 10/20/2015    . ferrous sulfate 325 (65 FE) MG tablet Take 325 mg by mouth 2 (two) times daily with a meal.    . fexofenadine (ALLEGRA) 30 MG tablet Take 30 mg by mouth 2 (two) times daily.    . fluticasone (FLONASE) 50 MCG/ACT nasal spray USE 2 SPRAYS IN EACH NOSTRIL ONCE DAILY 16 g 5  . glucose blood (ONETOUCH VERIO) test strip USE TO CHECK BLOOD SUGAR DAILY AND PRN 100 each 12  . Insulin Glargine (TOUJEO SOLOSTAR) 300 UNIT/ML SOPN Inject 30 Units into the skin at bedtime. (Patient taking differently: Inject 52 Units into the skin at bedtime. ) 1 pen 3  . insulin lispro (HUMALOG KWIKPEN) 100 UNIT/ML KiwkPen Administer 4 units prior to breakfast and lunch and 8 units prior to evening meal. 6 pen 3  . ipratropium (ATROVENT) 0.03 % nasal spray PLACE 2 SPRAYS INTO BOTH NOSTRILS 2 TIMES DAILY 30 mL 12  . KLOR-CON M20 20 MEQ tablet TAKE 1 TABLET BY MOUTH EVERY DAY 90 tablet 1  . LORazepam (ATIVAN) 0.5 MG tablet TAKE 2 TABLETS TWICE A DAY AS NEEDED 60 tablet 0  . losartan (COZAAR) 100 MG tablet TAKE 1 TABLET BY MOUTH ONCE DAILY 90 tablet 3  . metFORMIN (GLUCOPHAGE) 1000 MG tablet TAKE 1 TABLET BY MOUTH TWICE A DAY 180 tablet 4  . metoprolol (LOPRESSOR) 50 MG tablet TAKE 1 TABLET BY MOUTH EVERY DAY 90 tablet 1  . montelukast (SINGULAIR) 10 MG tablet TAKE 1 TABLET BY MOUTH EVERY DAY AT BEDTIME 30 tablet 12  . Multiple Vitamins-Minerals (CENTRUM SILVER PO) Take 1 tablet by mouth daily.      . OMEGA 3 1200 MG CAPS Take 1,000 mg by mouth daily.     Marland Kitchen omeprazole (PRILOSEC) 20 MG capsule Take 20 mg by mouth daily.    . ONE TOUCH LANCETS MISC USE TO CHECK BLOOD SUGAR  DAILY AND PRN 100 each 12  . oseltamivir (TAMIFLU) 75 MG capsule Take 1 capsule (75 mg total) by mouth every 12 (twelve) hours. 10 capsule 0  . terazosin (HYTRIN) 5 MG capsule TAKE 1  CAPSULE EVERY DAY 90 capsule 3  . Triamcinolone Acetonide (NASACORT AQ NA) Place 2 sprays into the nose as needed.     . TRULICITY 1.5 MW/1.0UV SOPN INJECT 1.5 MG INTO THE SKIN ONCE A WEEK. 2 pen 5  . VENTOLIN HFA 108 (90 Base) MCG/ACT inhaler INHALE 2 PUFFS INTO THE LUNGS EVERY 4 HOURS AS NEEDED FOR WHEEZING 18 Inhaler 2  . XIIDRA 5 % SOLN PLACE 1 DROP INTO EACH EYE TWICE A DAY  6   Current Facility-Administered Medications on File Prior to Visit  Medication Dose Route Frequency Provider Last Rate Last Dose  . 0.9 %  sodium chloride infusion  500 mL Intravenous Continuous Nelida Meuse III, MD        BP 130/68 (BP Location: Left Arm, Patient Position: Sitting, Cuff Size: Normal)   Pulse 80   Temp 98.2 F (36.8 C) (Oral)   Ht _0  (1.651 m)   Wt 153 lb 9.6 oz (69.7 kg)   BMI 25.56 kg/m     Review of Systems  Constitutional: Positive for fatigue. Negative for appetite change, chills and fever.  HENT: Negative for congestion, dental problem, ear pain, hearing loss, sore throat, tinnitus, trouble swallowing and voice change.   Eyes: Negative for pain, discharge and visual disturbance.  Respiratory: Negative for cough, chest tightness, wheezing and stridor.   Cardiovascular: Negative for chest pain, palpitations and leg swelling.  Gastrointestinal: Negative for abdominal distention, abdominal pain, blood in stool, constipation, diarrhea, nausea and vomiting.  Genitourinary: Negative for difficulty urinating, discharge, flank pain, genital sores, hematuria and urgency.  Musculoskeletal: Positive for gait problem. Negative for arthralgias, back pain, joint swelling, myalgias and neck stiffness.  Skin: Negative for rash.  Neurological: Positive for weakness. Negative for dizziness, syncope, speech difficulty,  numbness and headaches.  Hematological: Negative for adenopathy. Does not bruise/bleed easily.  Psychiatric/Behavioral: Negative for behavioral problems and dysphoric mood. The patient is not nervous/anxious.        Objective:   Physical Exam  Constitutional: He is oriented to person, place, and time. He appears well-developed.  HENT:  Head: Normocephalic.  Right Ear: External ear normal.  Left Ear: External ear normal.  Eyes: Conjunctivae and EOM are normal.  Neck: Normal range of motion.  Cardiovascular: Normal rate and normal heart sounds.   Pulmonary/Chest: He has rales.  Abdominal: Bowel sounds are normal.  Musculoskeletal: Normal range of motion. He exhibits no edema or tenderness.  Neurological: He is alert and oriented to person, place, and time.  Normal finger to nose testing Heel to shin testing a bit slow, but not grossly ataxic Romberg normal Unable to do a tandem walk  Psychiatric: He has a normal mood and affect. His behavior is normal.          Assessment & Plan:   Diabetes mellitus.  Will check hemoglobin A1c.  Appears to be improved History weight loss.  Improved Elevated sedimentation rate.  Will recheck today Dyslipidemia.  Continue statin therapy Remote history of possible lymphoma in remission Unsteady gait.  Will check head CT.  In view of other constitutional complaints  Follow-up rheumatology Return here in 3 months or as needed  Nyoka Cowden

## 2016-07-05 NOTE — Patient Instructions (Signed)
Limit your sodium (Salt) intake   Please check your hemoglobin A1c every 3 months  Rheumatology evaluation as discussed

## 2016-07-05 NOTE — Progress Notes (Signed)
Pre visit review using our clinic review tool, if applicable. No additional management support is needed unless otherwise documented below in the visit note. 

## 2016-07-09 ENCOUNTER — Other Ambulatory Visit: Payer: Self-pay | Admitting: Internal Medicine

## 2016-07-11 ENCOUNTER — Telehealth: Payer: Self-pay | Admitting: Oncology

## 2016-07-11 ENCOUNTER — Other Ambulatory Visit (HOSPITAL_BASED_OUTPATIENT_CLINIC_OR_DEPARTMENT_OTHER): Payer: Medicare Other

## 2016-07-11 ENCOUNTER — Ambulatory Visit (HOSPITAL_BASED_OUTPATIENT_CLINIC_OR_DEPARTMENT_OTHER): Payer: Medicare Other | Admitting: Oncology

## 2016-07-11 VITALS — BP 143/71 | HR 87 | Temp 98.7°F | Resp 18 | Ht 65.0 in | Wt 153.8 lb

## 2016-07-11 DIAGNOSIS — E119 Type 2 diabetes mellitus without complications: Secondary | ICD-10-CM | POA: Diagnosis not present

## 2016-07-11 DIAGNOSIS — Z8571 Personal history of Hodgkin lymphoma: Secondary | ICD-10-CM

## 2016-07-11 DIAGNOSIS — D649 Anemia, unspecified: Secondary | ICD-10-CM

## 2016-07-11 LAB — CBC WITH DIFFERENTIAL/PLATELET
BASO%: 0.8 % (ref 0.0–2.0)
Basophils Absolute: 0 10*3/uL (ref 0.0–0.1)
EOS%: 3.2 % (ref 0.0–7.0)
Eosinophils Absolute: 0.1 10*3/uL (ref 0.0–0.5)
HCT: 31.4 % — ABNORMAL LOW (ref 38.4–49.9)
HGB: 9.6 g/dL — ABNORMAL LOW (ref 13.0–17.1)
LYMPH%: 15.6 % (ref 14.0–49.0)
MCH: 24.9 pg — ABNORMAL LOW (ref 27.2–33.4)
MCHC: 30.7 g/dL — AB (ref 32.0–36.0)
MCV: 81 fL (ref 79.3–98.0)
MONO#: 0.8 10*3/uL (ref 0.1–0.9)
MONO%: 18.3 % — ABNORMAL HIGH (ref 0.0–14.0)
NEUT#: 2.8 10*3/uL (ref 1.5–6.5)
NEUT%: 62.1 % (ref 39.0–75.0)
PLATELETS: 336 10*3/uL (ref 140–400)
RBC: 3.87 10*6/uL — AB (ref 4.20–5.82)
RDW: 19.8 % — ABNORMAL HIGH (ref 11.0–14.6)
WBC: 4.5 10*3/uL (ref 4.0–10.3)
lymph#: 0.7 10*3/uL — ABNORMAL LOW (ref 0.9–3.3)

## 2016-07-11 NOTE — Telephone Encounter (Signed)
Appointments scheduled per 07/11/16 los. Patient was given a copy of the AVS report and appointment schedule per 07/11/16 los. °

## 2016-07-11 NOTE — Progress Notes (Signed)
  [facility] OFFICE PROGRESS NOTE   Diagnosis: History of Hodgkin's lymphoma, anemia  INTERVAL HISTORY:   [name] returns as scheduled. He reports feeling better. His energy level has improved. He currently has a cold with rhinorrhea and upper airway congestion. No fever. The abdominal discomfort has improved. He continues to have nocturia and urinary incontinence. He reports balance difficulty.   Objective:  Vital signs in last 24 hours:  Blood pressure (!) 143/71, pulse 87, temperature 98.7 F (37.1 C), temperature source Oral, resp. rate 18, height '5\' 5"'$  (1.651 m), weight 153 lb 12.8 oz (69.8 kg), SpO2 97 %.    HEENT: Neck without mass Lymphatics: No cervical, supraclavicular, axillary, or inguinal nodes Resp: Lungs with distant breath sounds bilaterally, no respiratory distress Cardio: Regular rate and rhythm GI: No hepatosplenomegaly, mild diffuse tenderness in the upper abdomen, no mass Vascular: No leg edema Neuro: Finger to nose testing is normal, the gait is normal    Lab Results:  Lab Results  Component Value Date   WBC 4.5 07/11/2016   HGB 9.6 (L) 07/11/2016   HCT 31.4 (L) 07/11/2016   MCV 81.0 07/11/2016   PLT 336 07/11/2016   NEUTROABS 2.8 07/11/2016    Medications: I have reviewed the patient's current medications.  Assessment/Plan: 1. Hodgkins lymphoma , stage IV, diagnosed in May 2000 - he remains in clinical remission. 2. History of a nodular skin rash with a biopsy August 02, 2006, confirming a CD30 positive lymphoproliferative disorder. He reports intermittent nodular skin lesions over the past few years that spontaneously resolve and occur months apart. No apparent lesions today. 3. Anemia  02/29/2016 iron studies consistent with anemia of chronic disease  Bone marrow biopsy 03/05/2016-hypercellular bone marrow with trilineage hematopoiesis. Increased number of megakaryocytes many of which show atypical forms. Erythroid and  granulocytic series with subtle essentially nonspecific changes. Abundant iron stores Cytogenetic analysis with normal findings. 4. Anorexia/weight loss-improved 5. Diabetes 6. 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 Treatment initiated per Dr. Loletha Carrow.     Disposition:  [name] has an improved performance status and the GI symptoms have improved. He continues to have anemia. The hemoglobin is increased compared to December. He will continue iron. [name] will return for an office visit in 2 months. He will contact us in the interim for new symptoms. He has been referred to rheumatology and will be seen in approximately 2 weeks.  15 minutes were spent with the patient today. The majority of the time was used for counseling and coordination of care.   Betsy Coder, MD  07/11/2016  12:38 PM

## 2016-07-17 NOTE — Telephone Encounter (Signed)
orders

## 2016-07-18 ENCOUNTER — Other Ambulatory Visit: Payer: Self-pay | Admitting: Internal Medicine

## 2016-07-25 ENCOUNTER — Emergency Department (HOSPITAL_COMMUNITY): Payer: Medicare Other

## 2016-07-25 ENCOUNTER — Inpatient Hospital Stay (HOSPITAL_COMMUNITY): Payer: Medicare Other

## 2016-07-25 ENCOUNTER — Encounter (HOSPITAL_COMMUNITY): Admission: EM | Disposition: E | Payer: Self-pay | Source: Home / Self Care | Attending: Cardiovascular Disease

## 2016-07-25 ENCOUNTER — Inpatient Hospital Stay (HOSPITAL_COMMUNITY)
Admission: EM | Admit: 2016-07-25 | Discharge: 2016-09-01 | DRG: 286 | Disposition: E | Payer: Medicare Other | Attending: Pulmonary Disease | Admitting: Pulmonary Disease

## 2016-07-25 ENCOUNTER — Encounter (HOSPITAL_COMMUNITY): Payer: Self-pay

## 2016-07-25 DIAGNOSIS — E875 Hyperkalemia: Secondary | ICD-10-CM | POA: Diagnosis present

## 2016-07-25 DIAGNOSIS — I10 Essential (primary) hypertension: Secondary | ICD-10-CM | POA: Diagnosis present

## 2016-07-25 DIAGNOSIS — J449 Chronic obstructive pulmonary disease, unspecified: Secondary | ICD-10-CM | POA: Diagnosis present

## 2016-07-25 DIAGNOSIS — R4701 Aphasia: Secondary | ICD-10-CM | POA: Diagnosis present

## 2016-07-25 DIAGNOSIS — E872 Acidosis: Secondary | ICD-10-CM | POA: Diagnosis present

## 2016-07-25 DIAGNOSIS — J90 Pleural effusion, not elsewhere classified: Secondary | ICD-10-CM | POA: Diagnosis present

## 2016-07-25 DIAGNOSIS — R55 Syncope and collapse: Secondary | ICD-10-CM

## 2016-07-25 DIAGNOSIS — R402212 Coma scale, best verbal response, none, at arrival to emergency department: Secondary | ICD-10-CM | POA: Diagnosis present

## 2016-07-25 DIAGNOSIS — Z885 Allergy status to narcotic agent status: Secondary | ICD-10-CM

## 2016-07-25 DIAGNOSIS — E43 Unspecified severe protein-calorie malnutrition: Secondary | ICD-10-CM | POA: Insufficient documentation

## 2016-07-25 DIAGNOSIS — Z961 Presence of intraocular lens: Secondary | ICD-10-CM | POA: Diagnosis present

## 2016-07-25 DIAGNOSIS — R402112 Coma scale, eyes open, never, at arrival to emergency department: Secondary | ICD-10-CM | POA: Diagnosis present

## 2016-07-25 DIAGNOSIS — G8191 Hemiplegia, unspecified affecting right dominant side: Secondary | ICD-10-CM | POA: Diagnosis present

## 2016-07-25 DIAGNOSIS — Z4659 Encounter for fitting and adjustment of other gastrointestinal appliance and device: Secondary | ICD-10-CM

## 2016-07-25 DIAGNOSIS — I2510 Atherosclerotic heart disease of native coronary artery without angina pectoris: Secondary | ICD-10-CM

## 2016-07-25 DIAGNOSIS — G4730 Sleep apnea, unspecified: Secondary | ICD-10-CM | POA: Diagnosis present

## 2016-07-25 DIAGNOSIS — Z9841 Cataract extraction status, right eye: Secondary | ICD-10-CM

## 2016-07-25 DIAGNOSIS — R404 Transient alteration of awareness: Secondary | ICD-10-CM | POA: Diagnosis not present

## 2016-07-25 DIAGNOSIS — I639 Cerebral infarction, unspecified: Secondary | ICD-10-CM

## 2016-07-25 DIAGNOSIS — E46 Unspecified protein-calorie malnutrition: Secondary | ICD-10-CM | POA: Diagnosis present

## 2016-07-25 DIAGNOSIS — I469 Cardiac arrest, cause unspecified: Secondary | ICD-10-CM | POA: Diagnosis not present

## 2016-07-25 DIAGNOSIS — E1165 Type 2 diabetes mellitus with hyperglycemia: Secondary | ICD-10-CM | POA: Diagnosis present

## 2016-07-25 DIAGNOSIS — Z833 Family history of diabetes mellitus: Secondary | ICD-10-CM

## 2016-07-25 DIAGNOSIS — Z794 Long term (current) use of insulin: Secondary | ICD-10-CM

## 2016-07-25 DIAGNOSIS — R0602 Shortness of breath: Secondary | ICD-10-CM | POA: Diagnosis not present

## 2016-07-25 DIAGNOSIS — Z515 Encounter for palliative care: Secondary | ICD-10-CM | POA: Diagnosis not present

## 2016-07-25 DIAGNOSIS — D509 Iron deficiency anemia, unspecified: Secondary | ICD-10-CM | POA: Diagnosis present

## 2016-07-25 DIAGNOSIS — Z8571 Personal history of Hodgkin lymphoma: Secondary | ICD-10-CM

## 2016-07-25 DIAGNOSIS — Z8249 Family history of ischemic heart disease and other diseases of the circulatory system: Secondary | ICD-10-CM

## 2016-07-25 DIAGNOSIS — I63412 Cerebral infarction due to embolism of left middle cerebral artery: Secondary | ICD-10-CM | POA: Diagnosis not present

## 2016-07-25 DIAGNOSIS — Z8673 Personal history of transient ischemic attack (TIA), and cerebral infarction without residual deficits: Secondary | ICD-10-CM

## 2016-07-25 DIAGNOSIS — G931 Anoxic brain damage, not elsewhere classified: Secondary | ICD-10-CM

## 2016-07-25 DIAGNOSIS — I462 Cardiac arrest due to underlying cardiac condition: Secondary | ICD-10-CM | POA: Diagnosis present

## 2016-07-25 DIAGNOSIS — G35 Multiple sclerosis: Secondary | ICD-10-CM | POA: Diagnosis not present

## 2016-07-25 DIAGNOSIS — J9600 Acute respiratory failure, unspecified whether with hypoxia or hypercapnia: Secondary | ICD-10-CM | POA: Diagnosis not present

## 2016-07-25 DIAGNOSIS — J9601 Acute respiratory failure with hypoxia: Secondary | ICD-10-CM | POA: Diagnosis not present

## 2016-07-25 DIAGNOSIS — Z7982 Long term (current) use of aspirin: Secondary | ICD-10-CM

## 2016-07-25 DIAGNOSIS — E874 Mixed disorder of acid-base balance: Secondary | ICD-10-CM | POA: Diagnosis present

## 2016-07-25 DIAGNOSIS — S199XXA Unspecified injury of neck, initial encounter: Secondary | ICD-10-CM | POA: Diagnosis not present

## 2016-07-25 DIAGNOSIS — Z452 Encounter for adjustment and management of vascular access device: Secondary | ICD-10-CM | POA: Diagnosis not present

## 2016-07-25 DIAGNOSIS — N179 Acute kidney failure, unspecified: Secondary | ICD-10-CM | POA: Diagnosis not present

## 2016-07-25 DIAGNOSIS — I4901 Ventricular fibrillation: Secondary | ICD-10-CM | POA: Diagnosis present

## 2016-07-25 DIAGNOSIS — S0990XA Unspecified injury of head, initial encounter: Secondary | ICD-10-CM | POA: Diagnosis not present

## 2016-07-25 DIAGNOSIS — Z7951 Long term (current) use of inhaled steroids: Secondary | ICD-10-CM

## 2016-07-25 DIAGNOSIS — Z66 Do not resuscitate: Secondary | ICD-10-CM | POA: Diagnosis not present

## 2016-07-25 DIAGNOSIS — E785 Hyperlipidemia, unspecified: Secondary | ICD-10-CM | POA: Diagnosis present

## 2016-07-25 DIAGNOSIS — Z4682 Encounter for fitting and adjustment of non-vascular catheter: Secondary | ICD-10-CM | POA: Diagnosis not present

## 2016-07-25 DIAGNOSIS — Z87891 Personal history of nicotine dependence: Secondary | ICD-10-CM

## 2016-07-25 DIAGNOSIS — Z9221 Personal history of antineoplastic chemotherapy: Secondary | ICD-10-CM

## 2016-07-25 DIAGNOSIS — Z6827 Body mass index (BMI) 27.0-27.9, adult: Secondary | ICD-10-CM

## 2016-07-25 DIAGNOSIS — R402312 Coma scale, best motor response, none, at arrival to emergency department: Secondary | ICD-10-CM | POA: Diagnosis present

## 2016-07-25 DIAGNOSIS — Z6824 Body mass index (BMI) 24.0-24.9, adult: Secondary | ICD-10-CM

## 2016-07-25 DIAGNOSIS — H510 Palsy (spasm) of conjugate gaze: Secondary | ICD-10-CM

## 2016-07-25 DIAGNOSIS — M1990 Unspecified osteoarthritis, unspecified site: Secondary | ICD-10-CM | POA: Diagnosis present

## 2016-07-25 DIAGNOSIS — Z9289 Personal history of other medical treatment: Secondary | ICD-10-CM

## 2016-07-25 DIAGNOSIS — Z801 Family history of malignant neoplasm of trachea, bronchus and lung: Secondary | ICD-10-CM

## 2016-07-25 DIAGNOSIS — R000 Tachycardia, unspecified: Secondary | ICD-10-CM | POA: Diagnosis not present

## 2016-07-25 HISTORY — PX: LEFT HEART CATH AND CORONARY ANGIOGRAPHY: CATH118249

## 2016-07-25 LAB — BASIC METABOLIC PANEL
ANION GAP: 10 (ref 5–15)
Anion gap: 10 (ref 5–15)
Anion gap: 11 (ref 5–15)
Anion gap: 7 (ref 5–15)
BUN: 15 mg/dL (ref 6–20)
BUN: 17 mg/dL (ref 6–20)
BUN: 16 mg/dL (ref 6–20)
BUN: 18 mg/dL (ref 6–20)
CALCIUM: 8.4 mg/dL — AB (ref 8.9–10.3)
CALCIUM: 8.5 mg/dL — AB (ref 8.9–10.3)
CALCIUM: 8.6 mg/dL — AB (ref 8.9–10.3)
CHLORIDE: 104 mmol/L (ref 101–111)
CHLORIDE: 104 mmol/L (ref 101–111)
CHLORIDE: 108 mmol/L (ref 101–111)
CO2: 23 mmol/L (ref 22–32)
CO2: 24 mmol/L (ref 22–32)
CO2: 24 mmol/L (ref 22–32)
CO2: 23 mmol/L (ref 22–32)
CREATININE: 1.02 mg/dL (ref 0.61–1.24)
CREATININE: 0.94 mg/dL (ref 0.61–1.24)
CREATININE: 0.89 mg/dL (ref 0.61–1.24)
CREATININE: 0.89 mg/dL (ref 0.61–1.24)
Calcium: 8.6 mg/dL — ABNORMAL LOW (ref 8.9–10.3)
Chloride: 109 mmol/L (ref 101–111)
GFR calc Af Amer: >60 mL/min (ref >60)
GFR calc non Af Amer: >60 mL/min (ref >60)
GFR calc non Af Amer: >60 mL/min (ref >60)
GFR calc non Af Amer: >60 mL/min (ref >60)
GFR calc non Af Amer: >60 mL/min (ref >60)
Glucose, Bld: 396 mg/dL — ABNORMAL HIGH (ref 65–99)
Glucose, Bld: 290 mg/dL — ABNORMAL HIGH (ref 65–99)
Glucose, Bld: 201 mg/dL — ABNORMAL HIGH (ref 65–99)
Glucose, Bld: 127 mg/dL — ABNORMAL HIGH (ref 65–99)
POTASSIUM: 3.4 mmol/L — AB (ref 3.5–5.1)
Potassium: 4.4 mmol/L (ref 3.5–5.1)
Potassium: 3.6 mmol/L (ref 3.5–5.1)
Potassium: 3.3 mmol/L — ABNORMAL LOW (ref 3.5–5.1)
SODIUM: 137 mmol/L (ref 135–145)
SODIUM: 139 mmol/L (ref 135–145)
SODIUM: 140 mmol/L (ref 135–145)
SODIUM: 141 mmol/L (ref 135–145)

## 2016-07-25 LAB — MAGNESIUM: Magnesium: 1.6 mg/dL — ABNORMAL LOW (ref 1.7–2.4)

## 2016-07-25 LAB — POCT I-STAT 3, VENOUS BLOOD GAS (G3P V)
ACID-BASE EXCESS: 3 mmol/L — AB (ref 0.0–2.0)
BICARBONATE: 29.1 mmol/L — AB (ref 20.0–28.0)
O2 Saturation: 100 %
TCO2: 31 mmol/L (ref 0–100)
pCO2, Ven: 52.7 mmHg (ref 44.0–60.0)
pH, Ven: 7.35 (ref 7.250–7.430)
pO2, Ven: 522 mmHg — ABNORMAL HIGH (ref 32.0–45.0)

## 2016-07-25 LAB — APTT
aPTT: 40 seconds — ABNORMAL HIGH (ref 24–36)
aPTT: 49 seconds — ABNORMAL HIGH (ref 24–36)

## 2016-07-25 LAB — CBC
HCT: 30.7 % — ABNORMAL LOW (ref 39.0–52.0)
HEMOGLOBIN: 8.9 g/dL — AB (ref 13.0–17.0)
MCH: 23.5 pg — ABNORMAL LOW (ref 26.0–34.0)
MCHC: 29 g/dL — AB (ref 30.0–36.0)
MCV: 81.2 fL (ref 78.0–100.0)
Platelets: 355 10*3/uL (ref 150–400)
RBC: 3.78 MIL/uL — ABNORMAL LOW (ref 4.22–5.81)
RDW: 17.2 % — ABNORMAL HIGH (ref 11.5–15.5)
WBC: 5.8 10*3/uL (ref 4.0–10.5)

## 2016-07-25 LAB — I-STAT TROPONIN, ED: Troponin i, poc: 0.04 ng/mL (ref 0.00–0.08)

## 2016-07-25 LAB — COMPREHENSIVE METABOLIC PANEL
ALT: 19 U/L (ref 17–63)
ANION GAP: 10 (ref 5–15)
AST: 49 U/L — AB (ref 15–41)
Albumin: 2.4 g/dL — ABNORMAL LOW (ref 3.5–5.0)
Alkaline Phosphatase: 108 U/L (ref 38–126)
BILIRUBIN TOTAL: 0.9 mg/dL (ref 0.3–1.2)
BUN: 15 mg/dL (ref 6–20)
CO2: 22 mmol/L (ref 22–32)
Calcium: 8.1 mg/dL — ABNORMAL LOW (ref 8.9–10.3)
Chloride: 107 mmol/L (ref 101–111)
Creatinine, Ser: 1.26 mg/dL — ABNORMAL HIGH (ref 0.61–1.24)
GFR calc Af Amer: >60 mL/min (ref >60)
GFR, EST NON AFRICAN AMERICAN: 57 mL/min — AB (ref >60)
GLUCOSE: 345 mg/dL — AB (ref 65–99)
POTASSIUM: 4.4 mmol/L (ref 3.5–5.1)
Sodium: 139 mmol/L (ref 135–145)
TOTAL PROTEIN: 6.2 g/dL — AB (ref 6.5–8.1)

## 2016-07-25 LAB — CBC WITH DIFFERENTIAL/PLATELET
BASOS ABS: 0 10*3/uL (ref 0.0–0.1)
BASOS PCT: 0 %
EOS ABS: 0.1 10*3/uL (ref 0.0–0.7)
EOS PCT: 1 %
HEMATOCRIT: 29.2 % — AB (ref 39.0–52.0)
Hemoglobin: 8.6 g/dL — ABNORMAL LOW (ref 13.0–17.0)
Lymphocytes Relative: 13 %
Lymphs Abs: 0.8 10*3/uL (ref 0.7–4.0)
MCH: 24.2 pg — ABNORMAL LOW (ref 26.0–34.0)
MCHC: 29.5 g/dL — AB (ref 30.0–36.0)
MCV: 82 fL (ref 78.0–100.0)
MONO ABS: 0.7 10*3/uL (ref 0.1–1.0)
MONOS PCT: 13 %
Neutro Abs: 4.2 10*3/uL (ref 1.7–7.7)
Neutrophils Relative %: 73 %
PLATELETS: 337 10*3/uL (ref 150–400)
RBC: 3.56 MIL/uL — ABNORMAL LOW (ref 4.22–5.81)
RDW: 17.5 % — AB (ref 11.5–15.5)
WBC: 5.8 10*3/uL (ref 4.0–10.5)

## 2016-07-25 LAB — TROPONIN I
TROPONIN I: 0.19 ng/mL — AB (ref <0.03)
Troponin I: 0.46 ng/mL (ref <0.03)
Troponin I: 0.54 ng/mL (ref <0.03)

## 2016-07-25 LAB — PROTIME-INR
INR: 1.25
INR: 1.2
INR: 1.22
PROTHROMBIN TIME: 15.8 s — AB (ref 11.4–15.2)
PROTHROMBIN TIME: 15.3 s — AB (ref 11.4–15.2)
Prothrombin Time: 15.5 seconds — ABNORMAL HIGH (ref 11.4–15.2)

## 2016-07-25 LAB — I-STAT ARTERIAL BLOOD GAS, ED
ACID-BASE EXCESS: 1 mmol/L (ref 0.0–2.0)
Bicarbonate: 27.5 mmol/L (ref 20.0–28.0)
O2 SAT: 100 %
PCO2 ART: 51.5 mmHg — AB (ref 32.0–48.0)
PH ART: 7.332 — AB (ref 7.350–7.450)
PO2 ART: 472 mmHg — AB (ref 83.0–108.0)
Patient temperature: 36.1
TCO2: 29 mmol/L (ref 0–100)

## 2016-07-25 LAB — I-STAT CHEM 8, ED
BUN: 18 mg/dL (ref 6–20)
CALCIUM ION: 1.07 mmol/L — AB (ref 1.15–1.40)
CHLORIDE: 107 mmol/L (ref 101–111)
CREATININE: 1 mg/dL (ref 0.61–1.24)
GLUCOSE: 339 mg/dL — AB (ref 65–99)
HCT: 27 % — ABNORMAL LOW (ref 39.0–52.0)
Hemoglobin: 9.2 g/dL — ABNORMAL LOW (ref 13.0–17.0)
Potassium: 3.8 mmol/L (ref 3.5–5.1)
Sodium: 142 mmol/L (ref 135–145)
TCO2: 24 mmol/L (ref 0–100)

## 2016-07-25 LAB — URINALYSIS, ROUTINE W REFLEX MICROSCOPIC
BILIRUBIN URINE: NEGATIVE
Cellular Cast, UA: 3
KETONES UR: NEGATIVE mg/dL
LEUKOCYTES UA: NEGATIVE
Nitrite: NEGATIVE
PH: 6 (ref 5.0–8.0)
PROTEIN: 100 mg/dL — AB
Specific Gravity, Urine: 1.009 (ref 1.005–1.030)

## 2016-07-25 LAB — GLUCOSE, CAPILLARY
GLUCOSE-CAPILLARY: 303 mg/dL — AB (ref 65–99)
GLUCOSE-CAPILLARY: 193 mg/dL — AB (ref 65–99)
GLUCOSE-CAPILLARY: 150 mg/dL — AB (ref 65–99)
GLUCOSE-CAPILLARY: 121 mg/dL — AB (ref 65–99)
GLUCOSE-CAPILLARY: 113 mg/dL — AB (ref 65–99)
Glucose-Capillary: 357 mg/dL — ABNORMAL HIGH (ref 65–99)
Glucose-Capillary: 364 mg/dL — ABNORMAL HIGH (ref 65–99)
Glucose-Capillary: 270 mg/dL — ABNORMAL HIGH (ref 65–99)
Glucose-Capillary: 223 mg/dL — ABNORMAL HIGH (ref 65–99)
Glucose-Capillary: 96 mg/dL (ref 65–99)
Glucose-Capillary: 106 mg/dL — ABNORMAL HIGH (ref 65–99)

## 2016-07-25 LAB — LIPASE, BLOOD: Lipase: 46 U/L (ref 11–51)

## 2016-07-25 LAB — I-STAT CG4 LACTIC ACID, ED: LACTIC ACID, VENOUS: 2.42 mmol/L — AB (ref 0.5–1.9)

## 2016-07-25 LAB — POCT I-STAT 3, ART BLOOD GAS (G3+)
ACID-BASE EXCESS: 4 mmol/L — AB (ref 0.0–2.0)
BICARBONATE: 29 mmol/L — AB (ref 20.0–28.0)
O2 Saturation: 100 %
PO2 ART: 520 mmHg — AB (ref 83.0–108.0)
Patient temperature: 90.3
TCO2: 30 mmol/L (ref 0–100)
pCO2 arterial: 37.5 mmHg (ref 32.0–48.0)
pH, Arterial: 7.477 — ABNORMAL HIGH (ref 7.350–7.450)

## 2016-07-25 LAB — LACTIC ACID, PLASMA: Lactic Acid, Venous: 0.8 mmol/L (ref 0.5–1.9)

## 2016-07-25 LAB — MRSA PCR SCREENING: MRSA by PCR: NEGATIVE

## 2016-07-25 SURGERY — LEFT HEART CATH AND CORONARY ANGIOGRAPHY

## 2016-07-25 MED ORDER — ATORVASTATIN CALCIUM 10 MG PO TABS
10.0000 mg | ORAL_TABLET | Freq: Every day | ORAL | Status: DC
  Administered 2016-07-25 – 2016-08-02 (×9): 10 mg
  Filled 2016-07-25 (×10): qty 1

## 2016-07-25 MED ORDER — EPINEPHRINE PF 1 MG/10ML IJ SOSY
PREFILLED_SYRINGE | INTRAMUSCULAR | Status: AC
  Filled 2016-07-25: qty 20

## 2016-07-25 MED ORDER — IOPAMIDOL (ISOVUE-370) INJECTION 76%
INTRAVENOUS | Status: DC | PRN
  Administered 2016-07-25: 35 mL via INTRA_ARTERIAL

## 2016-07-25 MED ORDER — NICARDIPINE HCL IN NACL 20-0.86 MG/200ML-% IV SOLN
3.0000 mg/h | INTRAVENOUS | Status: DC
  Administered 2016-07-25: 5 mg/h via INTRAVENOUS
  Filled 2016-07-25 (×3): qty 200

## 2016-07-25 MED ORDER — HEPARIN (PORCINE) IN NACL 2-0.9 UNIT/ML-% IJ SOLN
INTRAMUSCULAR | Status: DC | PRN
  Administered 2016-07-25: 1000 mL

## 2016-07-25 MED ORDER — SODIUM CHLORIDE 0.9 % IV SOLN
100.0000 ug/h | INTRAVENOUS | Status: DC
  Administered 2016-07-25 (×2): 200 ug/h via INTRAVENOUS
  Administered 2016-07-26: 300 ug/h via INTRAVENOUS
  Administered 2016-07-26: 200 ug/h via INTRAVENOUS
  Filled 2016-07-25 (×3): qty 50

## 2016-07-25 MED ORDER — SODIUM CHLORIDE 0.9 % IV SOLN: 250.0000 mL | INTRAVENOUS | Status: DC | PRN

## 2016-07-25 MED ORDER — SODIUM CHLORIDE 0.9 % IV SOLN
INTRAVENOUS | Status: DC | PRN
  Administered 2016-07-25: 10 mL/h via INTRAVENOUS

## 2016-07-25 MED ORDER — IOPAMIDOL (ISOVUE-370) INJECTION 76%
INTRAVENOUS | Status: AC
  Filled 2016-07-25: qty 125

## 2016-07-25 MED ORDER — ARTIFICIAL TEARS OP OINT
1.0000 "application " | TOPICAL_OINTMENT | Freq: Three times a day (TID) | OPHTHALMIC | Status: DC
  Administered 2016-07-25 – 2016-07-27 (×6): 1 via OPHTHALMIC
  Filled 2016-07-25 (×2): qty 3.5

## 2016-07-25 MED ORDER — INSULIN ASPART 100 UNIT/ML ~~LOC~~ SOLN
0.0000 [IU] | SUBCUTANEOUS | Status: DC
  Administered 2016-07-25: 15 [IU] via SUBCUTANEOUS

## 2016-07-25 MED ORDER — SODIUM CHLORIDE 0.9 % IV SOLN
1.0000 ug/kg/min | INTRAVENOUS | Status: DC
  Administered 2016-07-25: 1 ug/kg/min via INTRAVENOUS

## 2016-07-25 MED ORDER — ASPIRIN 300 MG RE SUPP
300.0000 mg | RECTAL | Status: AC
  Administered 2016-07-25: 300 mg via RECTAL
  Filled 2016-07-25 (×2): qty 1

## 2016-07-25 MED ORDER — CHLORHEXIDINE GLUCONATE 0.12% ORAL RINSE (MEDLINE KIT)
15.0000 mL | Freq: Two times a day (BID) | OROMUCOSAL | Status: DC
  Administered 2016-07-25 – 2016-08-02 (×17): 15 mL via OROMUCOSAL

## 2016-07-25 MED ORDER — HYDRALAZINE HCL 20 MG/ML IJ SOLN
10.0000 mg | INTRAMUSCULAR | Status: DC | PRN
  Administered 2016-07-25: 10 mg via INTRAVENOUS
  Filled 2016-07-25: qty 1

## 2016-07-25 MED ORDER — HEPARIN (PORCINE) IN NACL 2-0.9 UNIT/ML-% IJ SOLN
INTRAMUSCULAR | Status: AC
  Filled 2016-07-25: qty 1000

## 2016-07-25 MED ORDER — FENTANYL CITRATE (PF) 100 MCG/2ML IJ SOLN
INTRAMUSCULAR | Status: AC
  Filled 2016-07-25: qty 2

## 2016-07-25 MED ORDER — PROPOFOL 1000 MG/100ML IV EMUL: 25.0000 ug/kg/min | INTRAVENOUS | Status: DC

## 2016-07-25 MED ORDER — FENTANYL BOLUS VIA INFUSION
25.0000 ug | INTRAVENOUS | Status: DC | PRN
  Filled 2016-07-25: qty 25

## 2016-07-25 MED ORDER — ONDANSETRON HCL 4 MG/2ML IJ SOLN: 4.0000 mg | Freq: Four times a day (QID) | INTRAMUSCULAR | Status: DC | PRN

## 2016-07-25 MED ORDER — MIDAZOLAM HCL 2 MG/2ML IJ SOLN
INTRAMUSCULAR | Status: DC | PRN
  Administered 2016-07-25: 2 mg via INTRAVENOUS

## 2016-07-25 MED ORDER — SODIUM CHLORIDE 0.9% FLUSH
3.0000 mL | Freq: Two times a day (BID) | INTRAVENOUS | Status: DC
  Administered 2016-07-25 – 2016-07-26 (×4): 3 mL via INTRAVENOUS

## 2016-07-25 MED ORDER — SODIUM CHLORIDE 0.9 % IV SOLN
INTRAVENOUS | Status: DC
  Administered 2016-07-25: 2.4 [IU]/h via INTRAVENOUS
  Filled 2016-07-25: qty 2.5

## 2016-07-25 MED ORDER — ASPIRIN 81 MG PO CHEW
81.0000 mg | CHEWABLE_TABLET | Freq: Every day | ORAL | Status: DC
  Administered 2016-07-26 – 2016-08-02 (×8): 81 mg via ORAL
  Filled 2016-07-25 (×8): qty 1

## 2016-07-25 MED ORDER — SODIUM CHLORIDE 0.9 % IV SOLN
INTRAVENOUS | Status: AC
  Administered 2016-07-25: 11:00:00 via INTRAVENOUS

## 2016-07-25 MED ORDER — MAGNESIUM SULFATE 2 GM/50ML IV SOLN
2.0000 g | Freq: Once | INTRAVENOUS | Status: AC
  Administered 2016-07-25: 2 g via INTRAVENOUS
  Filled 2016-07-25: qty 50

## 2016-07-25 MED ORDER — NITROGLYCERIN 1 MG/10 ML FOR IR/CATH LAB
INTRA_ARTERIAL | Status: AC
  Filled 2016-07-25: qty 10

## 2016-07-25 MED ORDER — SODIUM CHLORIDE 0.9 % IV SOLN
INTRAVENOUS | Status: DC | PRN
  Administered 2016-07-25: 1000 mL via INTRAVENOUS

## 2016-07-25 MED ORDER — CISATRACURIUM BOLUS VIA INFUSION
0.0500 mg/kg | INTRAVENOUS | Status: DC | PRN
  Filled 2016-07-25: qty 4

## 2016-07-25 MED ORDER — CISATRACURIUM BOLUS VIA INFUSION
0.1000 mg/kg | Freq: Once | INTRAVENOUS | Status: AC
  Administered 2016-07-25: 7 mg via INTRAVENOUS

## 2016-07-25 MED ORDER — ACETAMINOPHEN 325 MG PO TABS: 650.0000 mg | ORAL_TABLET | ORAL | Status: DC | PRN

## 2016-07-25 MED ORDER — SODIUM CHLORIDE 0.9 % IV SOLN
25.0000 ug/h | INTRAVENOUS | Status: DC
  Administered 2016-07-25: 50 ug/h via INTRAVENOUS
  Filled 2016-07-25: qty 50

## 2016-07-25 MED ORDER — CISATRACURIUM BESYLATE (PF) 200 MG/20ML IV SOLN
1.0000 ug/kg/min | INTRAVENOUS | Status: DC
  Administered 2016-07-25: 1 ug/kg/min via INTRAVENOUS
  Filled 2016-07-25: qty 20

## 2016-07-25 MED ORDER — FENTANYL CITRATE (PF) 100 MCG/2ML IJ SOLN
INTRAMUSCULAR | Status: DC | PRN
  Administered 2016-07-25: 25 ug via INTRAVENOUS

## 2016-07-25 MED ORDER — HEPARIN SODIUM (PORCINE) 5000 UNIT/ML IJ SOLN
5000.0000 [IU] | Freq: Three times a day (TID) | INTRAMUSCULAR | Status: DC
  Administered 2016-07-25 – 2016-07-31 (×18): 5000 [IU] via SUBCUTANEOUS
  Filled 2016-07-25 (×18): qty 1

## 2016-07-25 MED ORDER — LIDOCAINE HCL (PF) 1 % IJ SOLN
INTRAMUSCULAR | Status: AC
  Filled 2016-07-25: qty 30

## 2016-07-25 MED ORDER — FENTANYL CITRATE (PF) 100 MCG/2ML IJ SOLN
50.0000 ug | Freq: Once | INTRAMUSCULAR | Status: AC
  Administered 2016-07-25: 50 ug via INTRAVENOUS

## 2016-07-25 MED ORDER — SODIUM CHLORIDE 0.9% FLUSH
3.0000 mL | INTRAVENOUS | Status: DC | PRN
Start: 2016-07-25 — End: 2016-07-27

## 2016-07-25 MED ORDER — MIDAZOLAM HCL 2 MG/2ML IJ SOLN
INTRAMUSCULAR | Status: AC
  Filled 2016-07-25: qty 2

## 2016-07-25 MED ORDER — NOREPINEPHRINE BITARTRATE 1 MG/ML IV SOLN
0.0000 ug/min | INTRAVENOUS | Status: DC
  Administered 2016-07-26: 20 ug/min via INTRAVENOUS
  Administered 2016-07-26: 21 ug/min via INTRAVENOUS
  Administered 2016-07-26: 5 ug/min via INTRAVENOUS
  Filled 2016-07-25 (×3): qty 4

## 2016-07-25 MED ORDER — LIDOCAINE HCL (PF) 1 % IJ SOLN
INTRAMUSCULAR | Status: DC | PRN
  Administered 2016-07-25: 25 mL

## 2016-07-25 MED ORDER — SODIUM CHLORIDE 0.9 % IV SOLN
INTRAVENOUS | Status: DC
  Administered 2016-07-25 – 2016-07-26 (×3): via INTRAVENOUS

## 2016-07-25 MED ORDER — ORAL CARE MOUTH RINSE
15.0000 mL | OROMUCOSAL | Status: DC
  Administered 2016-07-25 – 2016-08-02 (×81): 15 mL via OROMUCOSAL

## 2016-07-25 MED ORDER — PANTOPRAZOLE SODIUM 40 MG IV SOLR
40.0000 mg | Freq: Every day | INTRAVENOUS | Status: DC
  Administered 2016-07-25 – 2016-08-01 (×8): 40 mg via INTRAVENOUS
  Filled 2016-07-25 (×8): qty 40

## 2016-07-25 MED ORDER — NOREPINEPHRINE BITARTRATE 1 MG/ML IV SOLN
0.0000 ug/min | INTRAVENOUS | Status: DC
  Filled 2016-07-25: qty 4

## 2016-07-25 MED ORDER — SODIUM CHLORIDE 0.9 % IV SOLN
1.0000 mg/h | INTRAVENOUS | Status: DC
  Administered 2016-07-25: 1 mg/h via INTRAVENOUS
  Administered 2016-07-25 (×2): 4 mg/h via INTRAVENOUS
  Administered 2016-07-26 (×2): 6 mg/h via INTRAVENOUS
  Filled 2016-07-25 (×5): qty 10

## 2016-07-25 SURGICAL SUPPLY — 7 items
CATH INFINITI 5FR MULTPACK ANG (CATHETERS) ×2 IMPLANT
KIT HEART LEFT (KITS) ×3 IMPLANT
PACK CARDIAC CATHETERIZATION (CUSTOM PROCEDURE TRAY) ×3 IMPLANT
SHEATH PINNACLE 6F 10CM (SHEATH) ×2 IMPLANT
TRANSDUCER W/STOPCOCK (MISCELLANEOUS) ×3 IMPLANT
TUBING CIL FLEX 10 FLL-RA (TUBING) ×5 IMPLANT
WIRE EMERALD 3MM-J .035X150CM (WIRE) ×2 IMPLANT

## 2016-07-25 NOTE — Procedures (Signed)
Central Venous Catheter Insertion Procedure Note [name] [id number] 07-Oct-1950  Procedure: Insertion of Central Venous Catheter Indications: Assessment of intravascular volume, Drug and/or fluid administration and Frequent blood sampling  Procedure Details Consent: Risks of procedure as well as the alternatives and risks of each were explained to the (patient/caregiver).  Consent for procedure obtained. Time Out: Verified patient identification, verified procedure, site/side was marked, verified correct patient position, special equipment/implants available, medications/allergies/relevent history reviewed, required imaging and test results available.  Performed  Maximum sterile technique was used including antiseptics, cap, gloves, gown, hand hygiene, mask and sheet. Skin prep: Chlorhexidine; local anesthetic administered A antimicrobial bonded/coated triple lumen catheter was placed in the right internal jugular vein using the Seldinger technique.  Evaluation Blood flow good Complications: No apparent complications Patient did tolerate procedure well. Chest X-ray ordered to verify placement.  CXR: pending.  [name] 07/09/2016, 2:05 PM  [place] Collar removed, stabilized neck entire time Replaced collar  [name]. Titus Mould, MD, [place] Pgr: [facility]

## 2016-07-25 NOTE — Progress Notes (Signed)
Per Dr. Titus Mould okay to use Central Line and OG tube.

## 2016-07-25 NOTE — Procedures (Signed)
Electroencephalogram (EEG) Report  Date of study: 07/30/2016  Requesting clinician: Salvadore Dom, NP  Reason for study: Status post cardiac arrest on hypothermia protocol, exclude seizure  Brief clinical history: This is a 66 year old man who was admitted following cardiac arrest. He is now in ICU, intubated, sedated, and paralyzed on the hypothermic protocol. EEG is being performed to exclude possible seizure.  Medications:  Current Facility-Administered Medications:  .  0.9 %  sodium chloride infusion, , Intravenous, Continuous PRN, Gwenyth Allegra Tegeler, MD, 1,000 mL at 07/10/2016 0908 .  0.9 %  sodium chloride infusion, , Intravenous, Continuous, [name], MD, Last Rate: 75 mL/hr at 07/07/2016 1555 .  0.9 %  sodium chloride infusion, 250 mL, Intravenous, PRN, Lorretta Harp, MD .  acetaminophen (TYLENOL) tablet 650 mg, 650 mg, Oral, Q4H PRN, Lorretta Harp, MD .  artificial tears (LACRILUBE) ophthalmic ointment 1 application, 1 application, Both Eyes, Q8H, Erick Colace, NP, 1 application at 00/37/04 1432 .  [START ON 07/26/2016] aspirin chewable tablet 81 mg, 81 mg, Oral, Daily, Lorretta Harp, MD .  atorvastatin (LIPITOR) tablet 10 mg, 10 mg, Per Tube, Daily, Erick Colace, NP, 10 mg at 07/21/2016 1200 .  chlorhexidine gluconate (MEDLINE KIT) (PERIDEX) 0.12 % solution 15 mL, 15 mL, Mouth Rinse, BID, Lorretta Harp, MD, 15 mL at 07/13/2016 1121 .  cisatracurium (NIMBEX) 200 mg in sodium chloride 0.9 % 200 mL (1 mg/mL) infusion, 1-1.5 mcg/kg/min, Intravenous, Continuous, Lorretta Harp, MD, Last Rate: 4.2 mL/hr at 07/21/2016 1045, 1 mcg/kg/min at 07/18/2016 1045 .  [COMPLETED] cisatracurium (NIMBEX) bolus via infusion 7 mg, 0.1 mg/kg, Intravenous, Once, 7 mg at 07/19/2016 1042 **AND** cisatracurium (NIMBEX) 200 mg in sodium chloride 0.9 % 200 mL (1 mg/mL) infusion, 1-1.5 mcg/kg/min, Intravenous, Continuous, Last Rate: 4.2 mL/hr at 07/24/2016 1045, 1 mcg/kg/min at 07/23/2016 1045 **AND**  cisatracurium (NIMBEX) bolus via infusion 3.5 mg, 0.05 mg/kg, Intravenous, PRN, Erick Colace, NP .  EPINEPHrine (ADRENALIN) 1 MG/10ML injection, , , ,  .  fentaNYL (SUBLIMAZE) 2,500 mcg in sodium chloride 0.9 % 250 mL (10 mcg/mL) infusion, 100-300 mcg/hr, Intravenous, Continuous, Erick Colace, NP, Last Rate: 20 mL/hr at 07/26/2016 1043, 200 mcg/hr at 07/11/2016 1043 .  fentaNYL (SUBLIMAZE) bolus via infusion 25 mcg, 25 mcg, Intravenous, Q30 min PRN, Erick Colace, NP .  heparin injection 5,000 Units, 5,000 Units, Subcutaneous, Q8H, Erick Colace, NP, 5,000 Units at 07/08/2016 1432 .  insulin regular (NOVOLIN R,HUMULIN R) 250 Units in sodium chloride 0.9 % 250 mL (1 Units/mL) infusion, , Intravenous, Continuous, [name], MD, Last Rate: 4.9 mL/hr at 07/11/2016 1646 .  MEDLINE mouth rinse, 15 mL, Mouth Rinse, 10 times per day, Lorretta Harp, MD, 15 mL at 07/24/2016 1710 .  midazolam (VERSED) 50 mg in sodium chloride 0.9 % 50 mL (1 mg/mL) infusion, 1-10 mg/hr, Intravenous, Continuous, Lorretta Harp, MD, Last Rate: 4 mL/hr at 07/12/2016 1554, 4 mg/hr at 07/06/2016 1554 .  norepinephrine (LEVOPHED) 4 mg in dextrose 5 % 250 mL (0.016 mg/mL) infusion, 0-50 mcg/min, Intravenous, Continuous, Lorretta Harp, MD, Stopped at 07/16/2016 1030 .  norepinephrine (LEVOPHED) 4 mg in dextrose 5 % 250 mL (0.016 mg/mL) infusion, 0-50 mcg/min, Intravenous, Titrated, Erick Colace, NP .  ondansetron [facility]) injection 4 mg, 4 mg, Intravenous, Q6H PRN, Lorretta Harp, MD .  pantoprazole (PROTONIX) injection 40 mg, 40 mg, Intravenous, QHS, Erick Colace, NP .  propofol (DIPRIVAN) 1000 MG/100ML infusion,  25-80 mcg/kg/min, Intravenous, Continuous, Erick Colace, NP .  sodium chloride flush (NS) 0.9 % injection 3 mL, 3 mL, Intravenous, Q12H, Lorretta Harp, MD, 3 mL at 07/24/2016 1432 .  sodium chloride flush (NS) 0.9 % injection 3 mL, 3 mL, Intravenous, PRN, Lorretta Harp, MD  Description: This is a  routine EEG performed using standard international 10-20 electrode placement. A total of 18 channels are recorded, including one for the EKG. this study is performed at the patient's bedside in the intensive care unit. He is intubated. He is presently sedated with fentanyl and medazepam infusions. He is also paralyzed with a Nimbex infusion. Current temperature at the time of this study was 33C.  Activating Maneuvers: None  Findings:  The EKG channel demonstrates a regular rhythm with a rate of 70 beats per minute with occasional bigeminy.   This recording is notable for global suppression of the background. There is no reactivity.  There are no focal asymmetries. No epileptiform discharges are present. No seizures are recorded.    Impression: This is a markedly abnormal EEG due to the presence of global suppression of the background with absent reactivity.  Clinical correlation: This EEG pattern could suggest poor prognosis, though must be interpreted with caution as the patient is hypothermic and heavily sedated which limits the prognostic utility of the recording. There is no evidence of seizures.   Melba Coon, MD [facility]

## 2016-07-25 NOTE — Progress Notes (Signed)
CRITICAL VALUE ALERT  Critical value received:  Troponin 0.19  Date of notification:  07/20/2016  Time of notification:  1227  Critical value read back:Yes.    Nurse who received alert:  Blase Mess  MD notified (1st page):  Dr. Titus Mould  Time of first page:  1229  MD notified (2nd page):  Time of second page:  Responding MD:  Dr. Titus Mould   Time MD responded:  1229

## 2016-07-25 NOTE — H&P (View-Only) (Signed)
CONSULT NOTE  Date: 07/06/2016               Patient Name:  [name] MRN: [id number]  DOB: May 02, 1951 Age / Sex: 66 y.o., male        PCP: Nyoka Cowden Primary Cardiologist: New, Kammie Scioli            Referring Physician: Tegeler              Reason for Consult: Cardiac arrest.            History of Present Illness: Patient is a 66 y.o. male with a PMHx of Type 2 diabetes mellitus, history of Hodgkin's lymphoma, chronic anemia, who was admitted to [facility] on 07/15/2016 for evaluation of cardiac arrest.  The patient has previously seen by Dr. Lovena Le. He has a history in the remote past of left ventricle dysfunction with a reported LV EF of 42%. He has a history of Hodgkin's lymphoma and is status post chemotherapy. He has a history of hypertension, hyperlipidemia and diabetes mellitus. There is a history of stroke.  The patient was found face down in his house by family members. The family started CPR and called 911. EMS arrived and attached an AED. No rhythm strip was recorded but the AED fired and the patient regained spontaneous circulation. He was brought to [facility] and was intubated.  He is currently in NSR,  Intubated  BP is stable    Medications: Outpatient medications:  (Not in a hospital admission)  Current medications: Current Facility-Administered Medications  Medication Dose Route Frequency Provider Last Rate Last Dose  . 0.9 %  sodium chloride infusion  500 mL Intravenous Continuous Nelida Meuse III, MD      . 0.9 %  sodium chloride infusion   Intravenous Continuous PRN Gwenyth Allegra Tegeler, MD   1,000 mL at 07/23/2016 0908  . EPINEPHrine (ADRENALIN) 1 MG/10ML injection            Current Outpatient Prescriptions  Medication Sig Dispense Refill  . aspirin 325 MG tablet Take 325 mg by mouth daily.    Marland Kitchen atorvastatin (LIPITOR) 10 MG tablet TAKE 1 TABLET EVERY DAY 90 tablet 2  . benzonatate (TESSALON) 200 MG capsule TAKE 1 CAPSULE (200  MG TOTAL) BY MOUTH 3 (THREE) TIMES DAILY AS NEEDED FOR COUGH 30 capsule 1  . Blood Glucose Monitoring Suppl (ONETOUCH VERIO IQ SYSTEM) w/Device KIT USE TO CHECK BLOOD SUGAR 1 kit 0  . Cholecalciferol (VITAMIN D3 PO) Take 1,000 Units by mouth 2 (two) times daily.     . clotrimazole (LOTRIMIN) 1 % cream Apply topically 2 (two) times daily. 30 g 0  . Dextromethorphan-Guaifenesin (MUCINEX DM MAXIMUM STRENGTH) 60-1200 MG TB12 Take 1 tablet by mouth 2 (two) times daily. Reported on 10/20/2015    . ferrous sulfate 325 (65 FE) MG tablet Take 325 mg by mouth 2 (two) times daily with a meal.    . fexofenadine (ALLEGRA) 30 MG tablet Take 30 mg by mouth 2 (two) times daily.    . fluticasone (FLONASE) 50 MCG/ACT nasal spray USE 2 SPRAYS IN EACH NOSTRIL ONCE DAILY 16 g 5  . glucose blood (ONETOUCH VERIO) test strip USE TO CHECK BLOOD SUGAR DAILY AND PRN 100 each 12  . Insulin Glargine (TOUJEO SOLOSTAR) 300 UNIT/ML SOPN Inject 30 Units into the skin at bedtime. (Patient taking differently: Inject 52 Units into the skin at bedtime. ) 1 pen 3  .  insulin lispro (HUMALOG KWIKPEN) 100 UNIT/ML KiwkPen Administer 4 units prior to breakfast and lunch and 8 units prior to evening meal. 6 pen 3  . Insulin Pen Needle (B-D UF III MINI PEN NEEDLES) 31G X 5 MM MISC USE TWICE A DAY 100 each 5  . ipratropium (ATROVENT) 0.03 % nasal spray PLACE 2 SPRAYS INTO BOTH NOSTRILS 2 TIMES DAILY 30 mL 12  . KLOR-CON M20 20 MEQ tablet TAKE 1 TABLET BY MOUTH EVERY DAY 90 tablet 1  . LORazepam (ATIVAN) 0.5 MG tablet TAKE 2 TABLETS TWICE A DAY AS NEEDED 60 tablet 0  . losartan (COZAAR) 100 MG tablet TAKE 1 TABLET BY MOUTH ONCE DAILY 90 tablet 3  . metFORMIN (GLUCOPHAGE) 1000 MG tablet TAKE 1 TABLET BY MOUTH TWICE A DAY 180 tablet 4  . metoprolol (LOPRESSOR) 50 MG tablet TAKE 1 TABLET BY MOUTH EVERY DAY 90 tablet 1  . montelukast (SINGULAIR) 10 MG tablet TAKE 1 TABLET BY MOUTH EVERY DAY AT BEDTIME 30 tablet 12  . Multiple Vitamins-Minerals  (CENTRUM SILVER PO) Take 1 tablet by mouth daily.      . OMEGA 3 1200 MG CAPS Take 1,000 mg by mouth daily.     Marland Kitchen omeprazole (PRILOSEC) 20 MG capsule Take 20 mg by mouth daily.    . ONE TOUCH LANCETS MISC USE TO CHECK BLOOD SUGAR DAILY AND PRN 100 each 12  . terazosin (HYTRIN) 5 MG capsule TAKE 1 CAPSULE EVERY DAY 90 capsule 3  . Triamcinolone Acetonide (NASACORT AQ NA) Place 2 sprays into the nose as needed.     . TRULICITY 1.5 HU/3.1SH SOPN INJECT 1.5 MG INTO THE SKIN ONCE A WEEK. 2 pen 5  . VENTOLIN HFA 108 (90 Base) MCG/ACT inhaler INHALE 2 PUFFS INTO THE LUNGS EVERY 4 HOURS AS NEEDED FOR WHEEZING 18 Inhaler 2  . XIIDRA 5 % SOLN PLACE 1 DROP INTO EACH EYE TWICE A DAY  6     Allergies  Allergen Reactions  . Codeine Phosphate     Itching and gittery     Past Medical History:  Diagnosis Date  . Anemia   . Arthritis    joints,   . Cataract    removed both eyes  . COPD (chronic obstructive pulmonary disease) (HCC)    mild  . CVA (cerebral vascular accident) ([place]) 2005   hx of right brain subcortical stroke  . Diabetes mellitus type II   . H/O hiatal hernia   . Hodgkin's lymphoma ([place]) 2000   stage IV B status post ABCD chemotherapy -2000  . Hyperlipidemia   . Hypertension    Dr. Vidal Schwalbe cardiology  . 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 Pneumonia Feb 2008  . Radiculopathy    S1  . Sinus congestion 06/19/11   pt has had sinus "infection" and just finished a z-pak  dr geiger aware  . Sleep apnea    npo cpap    Past Surgical History:  Procedure Laterality Date  . 2-d echocardiogram  April 2002   ejection fraction 55 to 65%  . CATARACT EXTRACTION W/PHACO  06/19/2011   Procedure: CATARACT EXTRACTION PHACO AND INTRAOCULAR LENS PLACEMENT (IOC);  Surgeon: Adonis Brook, MD;  Location: [place];  Service: Ophthalmology;  Laterality: Right;  [place] FMA50BM +18.00 D Right  . COLONOSCOPY  2003  . lymph node biospy  2000    Family  History  Problem Relation Age of Onset  . Diabetes Mother   .  Heart disease Mother   . Heart failure Father   . Cancer Sister   . Lung cancer Sister   . Cancer Brother   . Lung cancer Brother   . Anesthesia problems Neg Hx   . Colon cancer Neg Hx   . Colon polyps Neg Hx   . Esophageal cancer Neg Hx     Social History:  reports that he quit smoking about 12 years ago. He quit after 30.00 years of use. He has never used smokeless tobacco. He reports that he does not drink alcohol or use drugs.   Review of Systems: Not available due to cardiac arrest / intubation   Physical Exam: BP 106/66   Pulse 88   Resp 14   SpO2 100%   Wt Readings from Last 3 Encounters:  07/11/16 153 lb 12.8 oz (69.8 kg)  07/05/16 153 lb 9.6 oz (69.7 kg)  06/10/16 154 lb (69.9 kg)    General: Vital signs reviewed and noted. .  Patient is currently intubated and sedated. He is unresponsive.   Head: Normocephalic, He has superficial abrasions across his [place]. His eyes are slightly swollen.   Neck: Supple. Negative for carotid bruits. No JVD   Lungs:  Clear bilaterally, no  wheezes, rales, or rhonchi. Breathing is normal   Heart: RRR with S1 S2. No murmurs, rubs, or gallops   Abdomen/ GI :  Soft, non-tender, non-distended with normoactive bowel sounds. No hepatomegaly. No rebound/guarding. No obvious abdominal masses   MSK: Strength and the appear normal for age.   Extremities: No clubbing or cyanosis. No edema.  Distal pedal pulses are 2+ and equal   Neurologic:  CN are grossly intact,  No obvious motor or sensory defect.  Alert and oriented X 3. Moves all extremities spontaneously.  Psych: Responds to questions appropriately with a normal affect.     Lab results: Basic Metabolic Panel:  Recent Labs Lab 07/24/2016 0916  NA 142  K 3.8  CL 107  GLUCOSE 339*  BUN 18  CREATININE 1.00    Liver Function Tests: No results for input(s): AST, ALT, ALKPHOS, BILITOT, PROT, ALBUMIN in the last 168  hours. No results for input(s): LIPASE, AMYLASE in the last 168 hours. No results for input(s): AMMONIA in the last 168 hours.  CBC:  Recent Labs Lab 07/13/2016 0902 07/16/2016 0916  WBC 5.8  --   NEUTROABS 4.2  --   HGB 8.6* 9.2*  HCT 29.2* 27.0*  MCV 82.0  --   PLT 337  --     Cardiac Enzymes: No results for input(s): CKTOTAL, CKMB, CKMBINDEX, TROPONINI in the last 168 hours.  BNP: Invalid input(s): POCBNP  CBG: No results for input(s): GLUCAP in the last 168 hours.  Coagulation Studies: No results for input(s): LABPROT, INR in the last 72 hours.   Other results: Personal review of EKG shows :  NSR .  No ST change  Imaging: Dg Chest Portable 1 View  Result Date: 07/20/2016 CLINICAL DATA:  CPR, unresponsive, intubated EXAM: PORTABLE CHEST 1 VIEW COMPARISON:  06/25/2016 FINDINGS: Cardiomediastinal silhouette is stable. No infiltrate or pulmonary edema. There is endotracheal tube in place with tip 2.7 cm in diameter. Defibrillator device and wire artifacts patches overlying the left chest wall. No pneumothorax. IMPRESSION: No infiltrate or pulmonary edema. Endotracheal tube in place. No pneumothorax. Electronically Signed   By: Natasha Mead M.D.   On: 07/16/2016 09:24        Assessment & Plan:  1. Cardiac arrest:  The patient was found down by his family and CPR was started. An AED was applied and he was shocked. I suspect that his original rhythm was ventricular fibrillation although we do not have a rhythm strip to prove that. He is currently in sinus rhythm and is relatively stable. He is intubated currently.  His lactic acid level is 2.42.  I discussed the case with Dr. Burt Knack. We will go ahead and bring him to the Cath Lab for diagnostic heart catheterization with possible PCI. He will have a head CT on his way up because of some evidence of head trauma.  He has a history of LV dysfunction by remote mother scan. We do not have any specific records in her current  chart.     Thayer Headings, Brooke Bonito., MD, [facility] 07/23/2016, 9:38 AM Office - [phone] Pager [phone]

## 2016-07-25 NOTE — ED Provider Notes (Signed)
[facility] DEPT Provider Note   CSN: [id number] Arrival date & time: 07/22/2016  8527     History   Chief Complaint Chief Complaint  Patient presents with  . Cardiac Arrest    HPI [name] is a 66 y.o. male with a past medical history significant for COPD, CVA, diabetes, Hodgkin's lymphoma status post chemotherapy, hypertension, hyperlipidemia, and documentation showing left ventricular dysfunction who presents as a cardiac arrest. Patient is brought in by EMS who report that family her patient fall in the bathroom this morning. On their arrival, patient was unresponsive. Patient quickly had bystander CPR initiated. EMS wasn't called. Upon fire department arrival, patient had 2 more minutes of compressions and the AED provided a shock. ROSC was then achieved. EMS was unable to find any evidence of trauma aside from bruising on the patient's right forehead. Patient intubated on the scene without difficulty and without sedation or paralysis. Patient is unresponsive upon arrival.  5 caveat for altered mental status and posterior rest.  HPI  Past Medical History:  Diagnosis Date  . Anemia   . Arthritis    joints,   . Cataract    removed both eyes  . COPD (chronic obstructive pulmonary disease) (HCC)    mild  . CVA (cerebral vascular accident) ([name]) 2005   hx of right brain subcortical stroke  . Diabetes mellitus type II   . H/O hiatal hernia   . Hodgkin's lymphoma ([name]) 2000   stage IV B status post ABCD chemotherapy -2000  . Hyperlipidemia   . Hypertension    Dr. Vidal Schwalbe cardiology  . 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 Pneumonia Feb 2008  . Radiculopathy    S1  . Sinus congestion 06/19/11   pt has had sinus "infection" and just finished a z-pak  dr geiger aware  . Sleep apnea    npo cpap    Patient Active Problem List   Diagnosis Date Noted  . Unsteady gait 07/05/2016  . Elevated sedimentation rate 07/05/2016  .  Helicobacter pylori gastritis 04/02/2016  . Anemia 01/30/2016  . Obstructive sleep apnea 04/13/2015  . Hodgkin's disease ([name]) 11/06/2006  . Diabetes mellitus with circulatory complication ([name]) 78/24/2353  . Dyslipidemia 11/06/2006  . Essential hypertension 11/06/2006  . Congestive heart failure ([name]) 11/06/2006  . History of cardiovascular disorder 11/06/2006    Past Surgical History:  Procedure Laterality Date  . 2-d echocardiogram  April 2002   ejection fraction 55 to 65%  . CATARACT EXTRACTION W/PHACO  06/19/2011   Procedure: CATARACT EXTRACTION PHACO AND INTRAOCULAR LENS PLACEMENT (IOC);  Surgeon: Adonis Brook, MD;  Location: [facility];  Service: Ophthalmology;  Laterality: Right;  [place] FMA50BM +18.00 D Right  . COLONOSCOPY  2003  . lymph node biospy  2000       Home Medications    Prior to Admission medications   Medication Sig Start Date End Date Taking? Authorizing Provider  aspirin 325 MG tablet Take 325 mg by mouth daily.    Historical Provider, MD  atorvastatin (LIPITOR) 10 MG tablet TAKE 1 TABLET EVERY DAY 05/14/16   Marletta Lor, MD  benzonatate (TESSALON) 200 MG capsule TAKE 1 CAPSULE (200 MG TOTAL) BY MOUTH 3 (THREE) TIMES DAILY AS NEEDED FOR COUGH 07/18/16   Marletta Lor, MD  Blood Glucose Monitoring Suppl [facility] VERIO IQ SYSTEM) w/Device KIT USE TO CHECK BLOOD SUGAR 10/12/15   Marletta Lor, MD  Cholecalciferol (VITAMIN D3 PO)  Take 1,000 Units by mouth 2 (two) times daily.     Historical Provider, MD  clotrimazole (LOTRIMIN) 1 % cream Apply topically 2 (two) times daily. 12/14/12   Marletta Lor, MD  Dextromethorphan-Guaifenesin [facility] DM MAXIMUM STRENGTH) 60-1200 MG TB12 Take 1 tablet by mouth 2 (two) times daily. Reported on 10/20/2015    Historical Provider, MD  ferrous sulfate 325 (65 FE) MG tablet Take 325 mg by mouth 2 (two) times daily with a meal.    Historical Provider, MD  fexofenadine (ALLEGRA) 30 MG tablet Take 30 mg by  mouth 2 (two) times daily.    Historical Provider, MD  fluticasone [facility]) 50 MCG/ACT nasal spray USE 2 SPRAYS IN [facility] NOSTRIL ONCE DAILY 04/01/16   Marletta Lor, MD  glucose blood [facility] VERIO) test strip USE TO CHECK BLOOD SUGAR DAILY AND PRN 10/12/15   Marletta Lor, MD  Insulin Glargine (TOUJEO SOLOSTAR) 300 UNIT/ML SOPN Inject 30 Units into the skin at bedtime. Patient taking differently: Inject 52 Units into the skin at bedtime.  01/30/16   Marletta Lor, MD  insulin lispro (HUMALOG KWIKPEN) 100 UNIT/ML KiwkPen Administer 4 units prior to breakfast and lunch and 8 units prior to evening meal. 04/03/16   Marletta Lor, MD  Insulin Pen Needle (B-D UF III MINI PEN NEEDLES) 31G X 5 MM MISC USE TWICE A DAY 07/05/16   Marletta Lor, MD  ipratropium (ATROVENT) 0.03 % nasal spray PLACE 2 SPRAYS INTO BOTH NOSTRILS 2 TIMES DAILY 12/14/15   Chelle Jeffery, PA-C  KLOR-CON M20 20 MEQ tablet TAKE 1 TABLET BY MOUTH EVERY DAY 07/08/16   Marletta Lor, MD  LORazepam (ATIVAN) 0.5 MG tablet TAKE 2 TABLETS TWICE A DAY AS NEEDED 01/09/16   Marletta Lor, MD  losartan (COZAAR) 100 MG tablet TAKE 1 TABLET BY MOUTH ONCE DAILY 11/16/15   Marletta Lor, MD  metFORMIN (GLUCOPHAGE) 1000 MG tablet TAKE 1 TABLET BY MOUTH TWICE A DAY 06/19/15   Marin Olp, MD  metoprolol (LOPRESSOR) 50 MG tablet TAKE 1 TABLET BY MOUTH EVERY DAY 10/09/15   Marletta Lor, MD  montelukast (SINGULAIR) 10 MG tablet TAKE 1 TABLET BY MOUTH EVERY DAY AT BEDTIME 03/21/16   Marletta Lor, MD  Multiple Vitamins-Minerals (CENTRUM SILVER PO) Take 1 tablet by mouth daily.      Historical Provider, MD  OMEGA 3 1200 MG CAPS Take 1,000 mg by mouth daily.     Historical Provider, MD  omeprazole (PRILOSEC) 20 MG capsule Take 20 mg by mouth daily.    Historical Provider, MD  ONE TOUCH LANCETS MISC USE TO CHECK BLOOD SUGAR DAILY AND PRN 10/12/15   Marletta Lor, MD  terazosin (HYTRIN) 5 MG capsule  TAKE 1 CAPSULE EVERY DAY 05/09/16   Marletta Lor, MD  Triamcinolone Acetonide (NASACORT AQ NA) Place 2 sprays into the nose as needed.     Historical Provider, MD  TRULICITY 1.5 UJ/8.1XB SOPN INJECT 1.5 MG INTO THE SKIN ONCE A WEEK. 06/10/16   Marletta Lor, MD  VENTOLIN HFA 108 [phone] Base) MCG/ACT inhaler INHALE 2 PUFFS INTO THE LUNGS EVERY 4 HOURS AS NEEDED FOR WHEEZING 03/19/16   Marletta Lor, MD  XIIDRA 5 % SOLN PLACE 1 DROP INTO EACH EYE TWICE A DAY 05/02/15   Historical Provider, MD    Family History Family History  Problem Relation Age of Onset  . Diabetes Mother   . Heart disease Mother   .  Heart failure Father   . Cancer Sister   . Lung cancer Sister   . Cancer Brother   . Lung cancer Brother   . Anesthesia problems Neg Hx   . Colon cancer Neg Hx   . Colon polyps Neg Hx   . Esophageal cancer Neg Hx     Social History Social History  Substance Use Topics  . Smoking status: Former Smoker    Years: 30.00    Quit date: 06/03/2004  . Smokeless tobacco: Never Used  . Alcohol use No     Comment: rarely     Allergies   Codeine phosphate   Review of Systems Review of Systems  Unable to perform ROS: Patient unresponsive     Physical Exam Updated Vital Signs BP 108/64   Pulse 103   Resp (!) 100   SpO2 100%   Physical Exam  Constitutional: He appears distressed.  HENT:  Head: Head is with contusion.    Right Ear: External ear normal.  Left Ear: External ear normal.  Nose: Nose normal.  Mouth/Throat: No oropharyngeal exudate.  Eyes: Conjunctivae are normal. Pupils are equal, round, and reactive to light.  Neck:  In C collar   Cardiovascular: Regular rhythm, normal heart sounds and intact distal pulses.   Extrasystoles are present. Tachycardia present.   No murmur heard. Pulmonary/Chest: No stridor. He has no wheezes. He exhibits no tenderness.  Intubated in field  Abdominal: There is no tenderness.  Musculoskeletal: He exhibits no  tenderness.  Neurological: He is unresponsive. He exhibits abnormal muscle tone. GCS eye subscore is 1. GCS verbal subscore is 1. GCS motor subscore is 1.  Skin: Capillary refill takes less than 2 seconds. He is diaphoretic. No erythema.  Nursing note and vitals reviewed.    ED Treatments / Results  Labs (all labs ordered are listed, but only abnormal results are displayed) Labs Reviewed  CBC WITH DIFFERENTIAL/PLATELET - Abnormal; Notable for the following:       Result Value   RBC 3.56 (*)    Hemoglobin 8.6 (*)    HCT 29.2 (*)    MCH 24.2 (*)    MCHC 29.5 (*)    RDW 17.5 (*)    All other components within normal limits  COMPREHENSIVE METABOLIC PANEL - Abnormal; Notable for the following:    Glucose, Bld 345 (*)    Creatinine, Ser 1.26 (*)    Calcium 8.1 (*)    Total Protein 6.2 (*)    Albumin 2.4 (*)    AST 49 (*)    GFR calc non Af Amer 57 (*)    All other components within normal limits  PROTIME-INR - Abnormal; Notable for the following:    Prothrombin Time 15.8 (*)    All other components within normal limits  MAGNESIUM - Abnormal; Notable for the following:    Magnesium 1.6 (*)    All other components within normal limits  URINALYSIS, ROUTINE W REFLEX MICROSCOPIC - Abnormal; Notable for the following:    APPearance HAZY (*)    Glucose, UA >=500 (*)    Hgb urine dipstick SMALL (*)    Protein, ur 100 (*)    Bacteria, UA FEW (*)    Squamous Epithelial / LPF 0-5 (*)    All other components within normal limits  TROPONIN I - Abnormal; Notable for the following:    Troponin I 0.19 (*)    All other components within normal limits  TROPONIN I - Abnormal; Notable for the  following:    Troponin I 0.46 (*)    All other components within normal limits  BASIC METABOLIC PANEL - Abnormal; Notable for the following:    Glucose, Bld 396 (*)    Calcium 8.4 (*)    All other components within normal limits  BASIC METABOLIC PANEL - Abnormal; Notable for the following:     Glucose, Bld 290 (*)    Calcium 8.5 (*)    All other components within normal limits  BASIC METABOLIC PANEL - Abnormal; Notable for the following:    Potassium 3.3 (*)    Glucose, Bld 201 (*)    Calcium 8.6 (*)    All other components within normal limits  PROTIME-INR - Abnormal; Notable for the following:    Prothrombin Time 15.3 (*)    All other components within normal limits  PROTIME-INR - Abnormal; Notable for the following:    Prothrombin Time 15.5 (*)    All other components within normal limits  APTT - Abnormal; Notable for the following:    aPTT 40 (*)    All other components within normal limits  APTT - Abnormal; Notable for the following:    aPTT 49 (*)    All other components within normal limits  CBC - Abnormal; Notable for the following:    RBC 3.78 (*)    Hemoglobin 8.9 (*)    HCT 30.7 (*)    MCH 23.5 (*)    MCHC 29.0 (*)    RDW 17.2 (*)    All other components within normal limits  GLUCOSE, CAPILLARY - Abnormal; Notable for the following:    Glucose-Capillary 357 (*)    All other components within normal limits  GLUCOSE, CAPILLARY - Abnormal; Notable for the following:    Glucose-Capillary 364 (*)    All other components within normal limits  GLUCOSE, CAPILLARY - Abnormal; Notable for the following:    Glucose-Capillary 303 (*)    All other components within normal limits  GLUCOSE, CAPILLARY - Abnormal; Notable for the following:    Glucose-Capillary 270 (*)    All other components within normal limits  GLUCOSE, CAPILLARY - Abnormal; Notable for the following:    Glucose-Capillary 223 (*)    All other components within normal limits  GLUCOSE, CAPILLARY - Abnormal; Notable for the following:    Glucose-Capillary 193 (*)    All other components within normal limits  GLUCOSE, CAPILLARY - Abnormal; Notable for the following:    Glucose-Capillary 150 (*)    All other components within normal limits  I-STAT CG4 LACTIC ACID, ED - Abnormal; Notable for the  following:    Lactic Acid, Venous 2.42 (*)    All other components within normal limits  I-STAT CHEM 8, ED - Abnormal; Notable for the following:    Glucose, Bld 339 (*)    Calcium, Ion 1.07 (*)    Hemoglobin 9.2 (*)    HCT 27.0 (*)    All other components within normal limits  I-STAT ARTERIAL BLOOD GAS, ED - Abnormal; Notable for the following:    pH, Arterial 7.332 (*)    pCO2 arterial 51.5 (*)    pO2, Arterial 472.0 (*)    All other components within normal limits  POCT I-STAT 3, VENOUS BLOOD GAS (G3P V) - Abnormal; Notable for the following:    pO2, Ven 522.0 (*)    Bicarbonate 29.1 (*)    Acid-Base Excess 3.0 (*)    All other components within normal limits  POCT I-STAT  3, ART BLOOD GAS (G3+) - Abnormal; Notable for the following:    pH, Arterial 7.477 (*)    pO2, Arterial 520.0 (*)    Bicarbonate 29.0 (*)    Acid-Base Excess 4.0 (*)    All other components within normal limits  MRSA PCR SCREENING  LIPASE, BLOOD  LACTIC ACID, PLASMA  TROPONIN I  BASIC METABOLIC PANEL  BASIC METABOLIC PANEL  BASIC METABOLIC PANEL  TROPONIN I  CBC  BLOOD GAS, ARTERIAL  MAGNESIUM  PHOSPHORUS  BASIC METABOLIC PANEL  BASIC METABOLIC PANEL  BASIC METABOLIC PANEL  I-STAT [place], ED  I-STAT [place], ED    EKG  EKG Interpretation  Date/Time:  Thursday July 25 2016 08:57:43 EST Ventricular Rate:  103 PR Interval:    QRS Duration: 96 QT Interval:  401 QTC Calculation: 525 R Axis:   -72 Text Interpretation:  Sinus tachycardia Left anterior fascicular block Abnormal R-wave progression, early transition Nonspecific T abnormalities, lateral leads Prolonged QT interval Sinus Tachycardia. No STEMI Confirmed by [facility] MD, Siarah Deleo [phone]) on 07/30/2016 7:46:55 PM       Radiology Ct Head Wo Contrast  Result Date: 07/13/2016 CLINICAL DATA:  Fall. Cardiac arrest. CPR and cardioversion performed. EXAM: CT HEAD WITHOUT CONTRAST CT CERVICAL SPINE WITHOUT CONTRAST TECHNIQUE:  Multidetector CT imaging of the head and cervical spine was performed following the standard protocol without intravenous contrast. Multiplanar CT image reconstructions of the cervical spine were also generated. COMPARISON:  None. FINDINGS: CT HEAD FINDINGS Brain: Generalized atrophy and white matter disease is mildly advanced for age. Remote ischemia thickening is present in the anterior body corpus callosum. No acute cortical infarct, hemorrhage, or mass lesion is present. The basal ganglia are intact. The insular ribbon is normal bilaterally. Ventricles are proportionate to the degree of atrophy. No significant extra-axial fluid collection is present. Vascular: Atherosclerotic calcifications are present without a hyperdense vessel. Skull: The calvarium is intact. No focal lytic or blastic lesions are present. Sinuses/Orbits: Mild mucosal thickening present me left maxillary sinus and bilateral anterior ethmoid air cell walls and inferior frontal sinuses. There is fluid in the nasopharynx, likely related to the nasal intubation. The patient is status post bilateral lens replacements. The globes and orbits are otherwise within normal limits. CT CERVICAL SPINE FINDINGS Alignment: AP alignment is anatomic. Skull base and vertebrae: The craniocervical junction is normal. Extensive endplate degenerative changes are present throughout the cervical spine. Vertebral body heights are maintained. No acute fractures are present. Soft tissues and spinal canal: The soft tissues the neck are unremarkable. The thyroid is heterogeneous. Disc levels: There is fusion across the disc space at C5-6. Uncovertebral spurring and foraminal stenosis is present at C3-4, C4-5 C5-6, to lesser extent at C6-7. Significant central canal stenosis is present C3-4, C4-5, C5-6, and C6-7 as well. Upper chest: Severe paraseptal and centrilobular emphysematous changes are present. Endotracheal tube is in place. IMPRESSION: 1. Atrophy and white matter  disease is mildly advanced for age. 2. No acute intracranial abnormality. 3. Multilevel spondylosis of the cervical spine with moderate central and severe foraminal stenosis at multiple levels. 4. No acute fracture or traumatic subluxation. 5. Sinus disease and fluid in the nasopharynx likely secondary to endotracheal and nasal intubation. 6. Emphysema. Electronically Signed   By: San Morelle M.D.   On: 07/30/2016 10:40   Ct Cervical Spine Wo Contrast  Result Date: 07/17/2016 CLINICAL DATA:  Fall. Cardiac arrest. CPR and cardioversion performed. EXAM: CT HEAD WITHOUT CONTRAST CT CERVICAL SPINE WITHOUT CONTRAST TECHNIQUE: Multidetector  CT imaging of the head and cervical spine was performed following the standard protocol without intravenous contrast. Multiplanar CT image reconstructions of the cervical spine were also generated. COMPARISON:  None. FINDINGS: CT HEAD FINDINGS Brain: Generalized atrophy and white matter disease is mildly advanced for age. Remote ischemia thickening is present in the anterior body corpus callosum. No acute cortical infarct, hemorrhage, or mass lesion is present. The basal ganglia are intact. The insular ribbon is normal bilaterally. Ventricles are proportionate to the degree of atrophy. No significant extra-axial fluid collection is present. Vascular: Atherosclerotic calcifications are present without a hyperdense vessel. Skull: The calvarium is intact. No focal lytic or blastic lesions are present. Sinuses/Orbits: Mild mucosal thickening present me left maxillary sinus and bilateral anterior ethmoid air cell walls and inferior frontal sinuses. There is fluid in the nasopharynx, likely related to the nasal intubation. The patient is status post bilateral lens replacements. The globes and orbits are otherwise within normal limits. CT CERVICAL SPINE FINDINGS Alignment: AP alignment is anatomic. Skull base and vertebrae: The craniocervical junction is normal. Extensive  endplate degenerative changes are present throughout the cervical spine. Vertebral body heights are maintained. No acute fractures are present. Soft tissues and spinal canal: The soft tissues the neck are unremarkable. The thyroid is heterogeneous. Disc levels: There is fusion across the disc space at C5-6. Uncovertebral spurring and foraminal stenosis is present at C3-4, C4-5 C5-6, to lesser extent at C6-7. Significant central canal stenosis is present C3-4, C4-5, C5-6, and C6-7 as well. Upper chest: Severe paraseptal and centrilobular emphysematous changes are present. Endotracheal tube is in place. IMPRESSION: 1. Atrophy and white matter disease is mildly advanced for age. 2. No acute intracranial abnormality. 3. Multilevel spondylosis of the cervical spine with moderate central and severe foraminal stenosis at multiple levels. 4. No acute fracture or traumatic subluxation. 5. Sinus disease and fluid in the nasopharynx likely secondary to endotracheal and nasal intubation. 6. Emphysema. Electronically Signed   By: San Morelle M.D.   On: 07/20/2016 10:40   Dg Chest Port 1 View  Result Date: 07/22/2016 CLINICAL DATA:  Status post central line and orogastric tube placement. EXAM: PORTABLE CHEST 1 VIEW COMPARISON:  Chest x-ray of 9:09 a.m. today FINDINGS: The orogastric tube tip and proximal port project below the left hemidiaphragm and off the inferior margin of the image. The right internal jugular venous catheter tip projects over the midportion of the SVC. There is no postprocedure pneumothorax. The lungs are adequately inflated. The endotracheal tube tip lies approximately 4.3 cm above the carina. There is increased density in the left retrocardiac region compatible with atelectasis. The cardiac silhouette is normal in size. The pulmonary vascularity is not engorged. IMPRESSION: No postprocedure complication following right internal jugular venous catheter placement. The orogastric tube tip and  proximal port project below the inferior margin of the image. Left lower lobe density more conspicuous than on the earlier study likely reflects developing atelectasis. Electronically Signed   By: David  Martinique M.D.   On: 07/24/2016 14:52   Dg Chest Portable 1 View  Result Date: 07/11/2016 CLINICAL DATA:  CPR, unresponsive, intubated EXAM: PORTABLE CHEST 1 VIEW COMPARISON:  06/25/2016 FINDINGS: Cardiomediastinal silhouette is stable. No infiltrate or pulmonary edema. There is endotracheal tube in place with tip 2.7 cm in diameter. Defibrillator device and wire artifacts patches overlying the left chest wall. No pneumothorax. IMPRESSION: No infiltrate or pulmonary edema. Endotracheal tube in place. No pneumothorax. Electronically Signed   By: Orlean Bradford.D.  On: 07/05/2016 09:24    Procedures Procedures (including critical care time)  CRITICAL CARE Performed by: Gwenyth Allegra Adante [place] Total critical care time: 45 minutes Critical care time was exclusive of separately billable procedures and treating other patients. Critical care was necessary to treat or prevent imminent or life-threatening deterioration. Critical care was time spent personally by me on the following activities: development of treatment plan with patient and/or surrogate as well as nursing, discussions with consultants, evaluation of patient's response to treatment, examination of patient, obtaining history from patient or surrogate, ordering and performing treatments and interventions, ordering and review of laboratory studies, ordering and review of radiographic studies, pulse oximetry and re-evaluation of patient's condition.   Medications Ordered in ED Medications  0.9 %  sodium chloride infusion (1,000 mLs Intravenous David Bag/Given 07/08/2016 0908)  EPINEPHrine (ADRENALIN) 1 MG/10ML injection (not administered)  norepinephrine (LEVOPHED) 4 mg in dextrose 5 % 250 mL (0.016 mg/mL) infusion (0 mcg/min Intravenous Hold 07/10/2016  1030)  midazolam (VERSED) 50 mg in sodium chloride 0.9 % 50 mL (1 mg/mL) infusion (4 mg/hr Intravenous Rate/Dose Verify 07/17/2016 1900)  cisatracurium (NIMBEX) 200 mg in sodium chloride 0.9 % 200 mL (1 mg/mL) infusion (1 mcg/kg/min  69.8 kg Intravenous David Bag/Given 07/14/2016 1045)  atorvastatin (LIPITOR) tablet 10 mg (10 mg Per Tube Given 07/05/2016 1200)  norepinephrine (LEVOPHED) 4 mg in dextrose 5 % 250 mL (0.016 mg/mL) infusion (0 mcg/min Intravenous Duplicate 0/25/85 2778)  cisatracurium (NIMBEX) bolus via infusion 7 mg (7 mg Intravenous Bolus from Bag 07/11/2016 1042)    And  cisatracurium (NIMBEX) 200 mg in sodium chloride 0.9 % 200 mL (1 mg/mL) infusion (1 mcg/kg/min  69.8 kg Intravenous David Bag/Given 07/20/2016 1045)    And  cisatracurium (NIMBEX) bolus via infusion 3.5 mg (not administered)  artificial tears (LACRILUBE) ophthalmic ointment 1 application (1 application Both Eyes Given 07/22/2016 1432)  heparin injection 5,000 Units (5,000 Units Subcutaneous Given 07/11/2016 1432)  0.9 %  sodium chloride infusion ( Intravenous Rate/Dose Change 07/08/2016 1555)  fentaNYL (SUBLIMAZE) 2,500 mcg in sodium chloride 0.9 % 250 mL (10 mcg/mL) infusion (200 mcg/hr Intravenous David Bag/Given 07/17/2016 1043)  fentaNYL (SUBLIMAZE) bolus via infusion 25 mcg (not administered)  propofol (DIPRIVAN) 1000 MG/100ML infusion (25 mcg/kg/min  69.8 kg Intravenous Not Given 07/16/2016 1030)  pantoprazole (PROTONIX) injection 40 mg (not administered)  acetaminophen (TYLENOL) tablet 650 mg (not administered)  ondansetron (ZOFRAN) injection 4 mg (not administered)  0.9 %  sodium chloride infusion ( Intravenous Stopped 07/28/2016 1555)  sodium chloride flush (NS) 0.9 % injection 3 mL (3 mLs Intravenous Given 07/23/2016 1432)  sodium chloride flush (NS) 0.9 % injection 3 mL (not administered)  0.9 %  sodium chloride infusion (not administered)  aspirin chewable tablet 81 mg (not administered)  chlorhexidine gluconate (MEDLINE KIT) (PERIDEX)  0.12 % solution 15 mL (15 mLs Mouth Rinse Given 07/31/2016 1121)  MEDLINE mouth rinse (15 mLs Mouth Rinse Given 07/19/2016 1710)  insulin regular (NOVOLIN R,HUMULIN R) 250 Units in sodium chloride 0.9 % 250 mL (1 Units/mL) infusion ( Intravenous Rate/Dose Verify 07/30/2016 1900)  aspirin suppository 300 mg (300 mg Rectal Given 07/22/2016 1200)  fentaNYL (SUBLIMAZE) injection 50 mcg (50 mcg Intravenous Given by Other 07/26/2016 1041)  magnesium sulfate IVPB 2 g 50 mL (2 g Intravenous Given 07/20/2016 1157)     Initial Impression / Assessment and Plan / ED Course  I have reviewed the triage vital signs and the nursing notes.  Pertinent labs & imaging results that  were available during my care of the patient were reviewed by me and considered in my medical decision making (see chart for details).     [name] is a 66 y.o. male with a past medical history significant for COPD, CVA, diabetes, Hodgkin's lymphoma status post chemotherapy, hypertension, hyperlipidemia, and documentation showing left ventricular dysfunction who presents as a cardiac arrest.  On arrival, airway was secured in the field. Breath sounds are equal bilaterally. Initial blood pressure was hypertensive. GCS was a 3T.  Patient was started on fluids, EKG was performed showing sinus tachycardia. According to EMS, patient was in atrial fibrillation with RVR in the field. While initial workup and exam was ongoing, patient had a run of ventricular tachycardia.  Cooling process was started.   Cardiology was subsequently called.  Cardiology came to the bedside and will take the patient to the Cath Lab for evaluation. Patient will have head CT scan given the concern for facial trauma before going to the Cath Lab. Patient is also awaiting laboratory testing. He was not available for any history gathering and all history was from EMS.  PT taken to cath lab for further management before mant of the lab testing returned. PT will be admitted to  critical care after cath.    Final Clinical Impressions(s) / ED Diagnoses   Final diagnoses:  Cardiac arrest ([place])    Clinical Impression: 1. Cardiac arrest ([place])   2. Encounter for central line placement   3. Encounter for orogastric tube placement     Disposition: Admit to Critical care    Courtney Paris, MD 07/11/2016 [phone]

## 2016-07-25 NOTE — Code Documentation (Addendum)
Pt from home by [facility] for fall and cardiac arrest. Pt was found down in the bathroom unresponsive CPR was started for 16mins and one shock given at house and pt had a pulse at that point. Pt in a-fib has abrasions to face

## 2016-07-25 NOTE — Progress Notes (Signed)
Site area: RFA Site Prior to Removal:  Level 0 Pressure Applied For:23 min Manual:  yes  Patient Status During Pull:  stable Post Pull Site:  Level 0 Post Pull Instructions Given:  N/A Post Pull Pulses Present:  Dressing Applied:  tegaderm Bedrest begins @ 1440 Comments: by Mliss Sax R./canderson

## 2016-07-25 NOTE — Interval H&P Note (Signed)
Cath Lab Visit (complete for each Cath Lab visit)  Clinical Evaluation Leading to the Procedure:   ACS: Yes.    Non-ACS:    Anginal Classification: CCS IV  Anti-ischemic medical therapy: No Therapy  Non-Invasive Test Results: No non-invasive testing performed  Prior CABG: No previous CABG      History and Physical Interval Note:  07/31/2016 10:17 AM  [name]  has presented today for surgery, with the diagnosis of urgent - post arrest  The various methods of treatment have been discussed with the patient and family. After consideration of risks, benefits and other options for treatment, the patient has consented to  Procedure(s): Left Heart Cath and Coronary Angiography (N/A) as a surgical intervention .  The patient's history has been reviewed, patient examined, no change in status, stable for surgery.  I have reviewed the patient's chart and labs.  Questions were answered to the patient's satisfaction.     Quay Burow

## 2016-07-25 NOTE — Progress Notes (Signed)
Responded to CPR to provide support to patient family in consultation room . Patient in CT.  EDP and critcal team briefing family.  Waiting for CT results possible admission.  Provided prayer, emotional and  Spiritual support.  Will follow as needed.   07/24/2016 1000  Clinical Encounter Type  Visited With Patient;Patient and family together;Health care provider  Visit Type Initial;Spiritual support;Critical Care  Referral From Nurse  Spiritual Encounters  Spiritual Needs Prayer;Emotional  Stress Factors  Family Stress Factors Exhausted;Health changes     07/12/2016 1000  Clinical Encounter Type  Visited With Patient;Patient and family together;Health care provider  Visit Type Initial;Spiritual support;Critical Care  Referral From Nurse  Spiritual Encounters  Spiritual Needs Prayer;Emotional  Stress Factors  Family Stress Factors Exhausted;Health changes  Jaclynn Major, [place]

## 2016-07-25 NOTE — Progress Notes (Signed)
[facility] Progress Note Patient Name: [name] DOB: April 03, 1951 MRN: [id number]   Date of Service  07/20/2016  HPI/Events of Note  BP elevated.  No improvement after hydralazine.  eICU Interventions  Will add cardene gtt.     Intervention Category Major Interventions: Other:  Jalah Warmuth 07/07/2016, 10:31 PM

## 2016-07-25 NOTE — Progress Notes (Signed)
CCM aware of pink colored urine, no continuous heparin drip. Will continue to monitor closely.

## 2016-07-25 NOTE — Progress Notes (Signed)
Approximately 2100 patient's arterial BP began gradually increasing reaching 215/84 (132).  Cuff pressures correlated.  MD notified multiple times.  Hydralazine given.  Per previous shift's report, patient diuresed well most the day, but later passed a large blood clot in urine.  Pt had zero output from 1746-2200.  Bladder scan showed retention of 349ml.  Bladder was irrigated and patient output 552ml, red urine.  BP remained high and Cardene gtt started per Md.  Will continue to monitor patient closely.  Mosie Lukes, RN

## 2016-07-25 NOTE — Care Management Note (Signed)
Case Management Note  Patient Details  Name: [name] MRN: [id number] Date of Birth: 1950-07-08  Subjective/Objective:     Adm w v fib, on ventilator               Action/Plan: lives w wife, pcp dr Cordelia Pen   Expected Discharge Date:                  Expected Discharge Plan:     In-House Referral:     Discharge planning Services  CM Consult  Post Acute Care Choice:    Choice offered to:     DME Arranged:    DME Agency:     HH Arranged:    [place] Agency:     Status of Service:  In process, will continue to follow  If discussed at Long Length of Stay Meetings, dates discussed:    Additional Comments: will moniter for dc needs as pt progresses. Lacretia Leigh, RN 07/24/2016, 11:30 AM

## 2016-07-25 NOTE — Progress Notes (Signed)
Bedside EEG completed, results pending. 

## 2016-07-25 NOTE — Consult Note (Signed)
CONSULT NOTE  Date: 07/23/2016               Patient Name:  [name] MRN: [id number]  DOB: May 02, 1951 Age / Sex: 66 y.o., male        PCP: Nyoka Cowden Primary Cardiologist: New, Shasta Chinn            Referring Physician: Tegeler              Reason for Consult: Cardiac arrest.            History of Present Illness: Patient is a 66 y.o. male with a PMHx of Type 2 diabetes mellitus, history of Hodgkin's lymphoma, chronic anemia, who was admitted to [facility] on 07/20/2016 for evaluation of cardiac arrest.  The patient has previously seen by Dr. Lovena Le. He has a history in the remote past of left ventricle dysfunction with a reported LV EF of 42%. He has a history of Hodgkin's lymphoma and is status post chemotherapy. He has a history of hypertension, hyperlipidemia and diabetes mellitus. There is a history of stroke.  The patient was found face down in his house by family members. The family started CPR and called 911. EMS arrived and attached an AED. No rhythm strip was recorded but the AED fired and the patient regained spontaneous circulation. He was brought to [facility] and was intubated.  He is currently in NSR,  Intubated  BP is stable    Medications: Outpatient medications:  (Not in a hospital admission)  Current medications: Current Facility-Administered Medications  Medication Dose Route Frequency Provider Last Rate Last Dose  . 0.9 %  sodium chloride infusion  500 mL Intravenous Continuous Nelida Meuse III, MD      . 0.9 %  sodium chloride infusion   Intravenous Continuous PRN Gwenyth Allegra Tegeler, MD   1,000 mL at 07/27/2016 0908  . EPINEPHrine (ADRENALIN) 1 MG/10ML injection            Current Outpatient Prescriptions  Medication Sig Dispense Refill  . aspirin 325 MG tablet Take 325 mg by mouth daily.    Marland Kitchen atorvastatin (LIPITOR) 10 MG tablet TAKE 1 TABLET EVERY DAY 90 tablet 2  . benzonatate (TESSALON) 200 MG capsule TAKE 1 CAPSULE (200  MG TOTAL) BY MOUTH 3 (THREE) TIMES DAILY AS NEEDED FOR COUGH 30 capsule 1  . Blood Glucose Monitoring Suppl (ONETOUCH VERIO IQ SYSTEM) w/Device KIT USE TO CHECK BLOOD SUGAR 1 kit 0  . Cholecalciferol (VITAMIN D3 PO) Take 1,000 Units by mouth 2 (two) times daily.     . clotrimazole (LOTRIMIN) 1 % cream Apply topically 2 (two) times daily. 30 g 0  . Dextromethorphan-Guaifenesin (MUCINEX DM MAXIMUM STRENGTH) 60-1200 MG TB12 Take 1 tablet by mouth 2 (two) times daily. Reported on 10/20/2015    . ferrous sulfate 325 (65 FE) MG tablet Take 325 mg by mouth 2 (two) times daily with a meal.    . fexofenadine (ALLEGRA) 30 MG tablet Take 30 mg by mouth 2 (two) times daily.    . fluticasone (FLONASE) 50 MCG/ACT nasal spray USE 2 SPRAYS IN EACH NOSTRIL ONCE DAILY 16 g 5  . glucose blood (ONETOUCH VERIO) test strip USE TO CHECK BLOOD SUGAR DAILY AND PRN 100 each 12  . Insulin Glargine (TOUJEO SOLOSTAR) 300 UNIT/ML SOPN Inject 30 Units into the skin at bedtime. (Patient taking differently: Inject 52 Units into the skin at bedtime. ) 1 pen 3  .  insulin lispro (HUMALOG KWIKPEN) 100 UNIT/ML KiwkPen Administer 4 units prior to breakfast and lunch and 8 units prior to evening meal. 6 pen 3  . Insulin Pen Needle (B-D UF III MINI PEN NEEDLES) 31G X 5 MM MISC USE TWICE A DAY 100 each 5  . ipratropium (ATROVENT) 0.03 % nasal spray PLACE 2 SPRAYS INTO BOTH NOSTRILS 2 TIMES DAILY 30 mL 12  . KLOR-CON M20 20 MEQ tablet TAKE 1 TABLET BY MOUTH EVERY DAY 90 tablet 1  . LORazepam (ATIVAN) 0.5 MG tablet TAKE 2 TABLETS TWICE A DAY AS NEEDED 60 tablet 0  . losartan (COZAAR) 100 MG tablet TAKE 1 TABLET BY MOUTH ONCE DAILY 90 tablet 3  . metFORMIN (GLUCOPHAGE) 1000 MG tablet TAKE 1 TABLET BY MOUTH TWICE A DAY 180 tablet 4  . metoprolol (LOPRESSOR) 50 MG tablet TAKE 1 TABLET BY MOUTH EVERY DAY 90 tablet 1  . montelukast (SINGULAIR) 10 MG tablet TAKE 1 TABLET BY MOUTH EVERY DAY AT BEDTIME 30 tablet 12  . Multiple Vitamins-Minerals  (CENTRUM SILVER PO) Take 1 tablet by mouth daily.      . OMEGA 3 1200 MG CAPS Take 1,000 mg by mouth daily.     Marland Kitchen omeprazole (PRILOSEC) 20 MG capsule Take 20 mg by mouth daily.    . ONE TOUCH LANCETS MISC USE TO CHECK BLOOD SUGAR DAILY AND PRN 100 each 12  . terazosin (HYTRIN) 5 MG capsule TAKE 1 CAPSULE EVERY DAY 90 capsule 3  . Triamcinolone Acetonide (NASACORT AQ NA) Place 2 sprays into the nose as needed.     . TRULICITY 1.5 HU/3.1SH SOPN INJECT 1.5 MG INTO THE SKIN ONCE A WEEK. 2 pen 5  . VENTOLIN HFA 108 (90 Base) MCG/ACT inhaler INHALE 2 PUFFS INTO THE LUNGS EVERY 4 HOURS AS NEEDED FOR WHEEZING 18 Inhaler 2  . XIIDRA 5 % SOLN PLACE 1 DROP INTO EACH EYE TWICE A DAY  6     Allergies  Allergen Reactions  . Codeine Phosphate     Itching and gittery     Past Medical History:  Diagnosis Date  . Anemia   . Arthritis    joints,   . Cataract    removed both eyes  . COPD (chronic obstructive pulmonary disease) (HCC)    mild  . CVA (cerebral vascular accident) ([place]) 2005   hx of right brain subcortical stroke  . Diabetes mellitus type II   . H/O hiatal hernia   . Hodgkin's lymphoma ([place]) 2000   stage IV B status post ABCD chemotherapy -2000  . Hyperlipidemia   . Hypertension    Dr. Vidal Schwalbe cardiology  . 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 Pneumonia Feb 2008  . Radiculopathy    S1  . Sinus congestion 06/19/11   pt has had sinus "infection" and just finished a z-pak  dr geiger aware  . Sleep apnea    npo cpap    Past Surgical History:  Procedure Laterality Date  . 2-d echocardiogram  April 2002   ejection fraction 55 to 65%  . CATARACT EXTRACTION W/PHACO  06/19/2011   Procedure: CATARACT EXTRACTION PHACO AND INTRAOCULAR LENS PLACEMENT (IOC);  Surgeon: Adonis Brook, MD;  Location: [facility];  Service: Ophthalmology;  Laterality: Right;  [place] FMA50BM +18.00 D Right  . COLONOSCOPY  2003  . lymph node biospy  2000    Family  History  Problem Relation Age of Onset  . Diabetes Mother   .  Heart disease Mother   . Heart failure Father   . Cancer Sister   . Lung cancer Sister   . Cancer Brother   . Lung cancer Brother   . Anesthesia problems Neg Hx   . Colon cancer Neg Hx   . Colon polyps Neg Hx   . Esophageal cancer Neg Hx     Social History:  reports that he quit smoking about 12 years ago. He quit after 30.00 years of use. He has never used smokeless tobacco. He reports that he does not drink alcohol or use drugs.   Review of Systems: Not available due to cardiac arrest / intubation   Physical Exam: BP 106/66   Pulse 88   Resp 14   SpO2 100%   Wt Readings from Last 3 Encounters:  07/11/16 153 lb 12.8 oz (69.8 kg)  07/05/16 153 lb 9.6 oz (69.7 kg)  06/10/16 154 lb (69.9 kg)    General: Vital signs reviewed and noted. .  Patient is currently intubated and sedated. He is unresponsive.   Head: Normocephalic, He has superficial abrasions across his [place]. His eyes are slightly swollen.   Neck: Supple. Negative for carotid bruits. No JVD   Lungs:  Clear bilaterally, no  wheezes, rales, or rhonchi. Breathing is normal   Heart: RRR with S1 S2. No murmurs, rubs, or gallops   Abdomen/ GI :  Soft, non-tender, non-distended with normoactive bowel sounds. No hepatomegaly. No rebound/guarding. No obvious abdominal masses   MSK: Strength and the appear normal for age.   Extremities: No clubbing or cyanosis. No edema.  Distal pedal pulses are 2+ and equal   Neurologic:  CN are grossly intact,  No obvious motor or sensory defect.  Alert and oriented X 3. Moves all extremities spontaneously.  Psych: Responds to questions appropriately with a normal affect.     Lab results: Basic Metabolic Panel:  Recent Labs Lab 07/05/2016 0916  NA 142  K 3.8  CL 107  GLUCOSE 339*  BUN 18  CREATININE 1.00    Liver Function Tests: No results for input(s): AST, ALT, ALKPHOS, BILITOT, PROT, ALBUMIN in the last 168  hours. No results for input(s): LIPASE, AMYLASE in the last 168 hours. No results for input(s): AMMONIA in the last 168 hours.  CBC:  Recent Labs Lab 07/17/2016 0902 07/28/2016 0916  WBC 5.8  --   NEUTROABS 4.2  --   HGB 8.6* 9.2*  HCT 29.2* 27.0*  MCV 82.0  --   PLT 337  --     Cardiac Enzymes: No results for input(s): CKTOTAL, CKMB, CKMBINDEX, TROPONINI in the last 168 hours.  BNP: Invalid input(s): POCBNP  CBG: No results for input(s): GLUCAP in the last 168 hours.  Coagulation Studies: No results for input(s): LABPROT, INR in the last 72 hours.   Other results: Personal review of EKG shows :  NSR .  No ST change  Imaging: Dg Chest Portable 1 View  Result Date: 07/24/2016 CLINICAL DATA:  CPR, unresponsive, intubated EXAM: PORTABLE CHEST 1 VIEW COMPARISON:  06/25/2016 FINDINGS: Cardiomediastinal silhouette is stable. No infiltrate or pulmonary edema. There is endotracheal tube in place with tip 2.7 cm in diameter. Defibrillator device and wire artifacts patches overlying the left chest wall. No pneumothorax. IMPRESSION: No infiltrate or pulmonary edema. Endotracheal tube in place. No pneumothorax. Electronically Signed   By: Lahoma Crocker M.D.   On: 07/21/2016 09:24        Assessment & Plan:  1. Cardiac arrest:  The patient was found down by his family and CPR was started. An AED was applied and he was shocked. I suspect that his original rhythm was ventricular fibrillation although we do not have a rhythm strip to prove that. He is currently in sinus rhythm and is relatively stable. He is intubated currently.  His lactic acid level is 2.42.  I discussed the case with Dr. Burt Knack. We will go ahead and bring him to the Cath Lab for diagnostic heart catheterization with possible PCI. He will have a head CT on his way up because of some evidence of head trauma.  He has a history of LV dysfunction by remote mother scan. We do not have any specific records in her current  chart.     Thayer Headings, Brooke Bonito., MD, [facility] 07/16/2016, 9:38 AM Office - [phone] Pager [phone]

## 2016-07-25 NOTE — Code Documentation (Addendum)
Start cooling due to run of v-tack

## 2016-07-25 NOTE — Progress Notes (Signed)
  Echocardiogram 2D Echocardiogram has been performed.  Donata Clay 07/16/2016, 4:46 PM

## 2016-07-25 NOTE — H&P (Signed)
PULMONARY / CRITICAL CARE MEDICINE   Name: [name] MRN: [id number] DOB: 01/06/1951    ADMISSION DATE:  07/28/2016  REFERRING MD:  Tegeler   CHIEF COMPLAINT:  VF arrest  HISTORY OF PRESENT ILLNESS:   This is a 66 year old male w/ h/o HTN, DM type II and prior CVA. Also remote h/o hodgkin's lymphoma. Found down in bathroom after a fall. CPR initiated by spouse. Initial rhythm was VF on EMS arrival. CPR continued, shocked x1. Time to ROSC estimated at 7 minutes. Pt unresponsive on arrival w/ report of possible localization to noxious stim, cough and later decerebrate posturing. Placed in C collar, CT head negative, rushed to cardiac cath lab PCCM to admit   PAST MEDICAL HISTORY :  He  has a past medical history of Anemia; Arthritis; Cataract; COPD (chronic obstructive pulmonary disease) ([place]); CVA (cerebral vascular accident) ([place]) (2005); Diabetes mellitus type II; H/O hiatal hernia; Hodgkin's lymphoma ([place]) (2000); Hyperlipidemia; Hypertension; Left ventricular dysfunction; Pneumonia (Feb 2008); Radiculopathy; Sinus congestion (06/19/11); and Sleep apnea.  PAST SURGICAL HISTORY: He  has a past surgical history that includes lymph node biospy (2000); Colonoscopy (2003); 2-d echocardiogram (April 2002); and Cataract extraction w/PHACO (06/19/2011).  Allergies  Allergen Reactions  . Codeine Phosphate     Itching and gittery    Current Facility-Administered Medications on File Prior to Encounter  Medication  . 0.9 %  sodium chloride infusion   Current Outpatient Prescriptions on File Prior to Encounter  Medication Sig  . aspirin 325 MG tablet Take 325 mg by mouth daily.  Marland Kitchen atorvastatin (LIPITOR) 10 MG tablet TAKE 1 TABLET EVERY DAY  . benzonatate (TESSALON) 200 MG capsule TAKE 1 CAPSULE (200 MG TOTAL) BY MOUTH 3 (THREE) TIMES DAILY AS NEEDED FOR COUGH  . Blood Glucose Monitoring Suppl (ONETOUCH VERIO IQ SYSTEM) w/Device KIT USE TO CHECK BLOOD SUGAR  . Cholecalciferol (VITAMIN D3 PO)  Take 1,000 Units by mouth 2 (two) times daily.   . clotrimazole (LOTRIMIN) 1 % cream Apply topically 2 (two) times daily.  Marland Kitchen Dextromethorphan-Guaifenesin (MUCINEX DM MAXIMUM STRENGTH) 60-1200 MG TB12 Take 1 tablet by mouth 2 (two) times daily. Reported on 10/20/2015  . ferrous sulfate 325 (65 FE) MG tablet Take 325 mg by mouth 2 (two) times daily with a meal.  . fexofenadine (ALLEGRA) 30 MG tablet Take 30 mg by mouth 2 (two) times daily.  . fluticasone (FLONASE) 50 MCG/ACT nasal spray USE 2 SPRAYS IN EACH NOSTRIL ONCE DAILY  . glucose blood (ONETOUCH VERIO) test strip USE TO CHECK BLOOD SUGAR DAILY AND PRN  . Insulin Glargine (TOUJEO SOLOSTAR) 300 UNIT/ML SOPN Inject 30 Units into the skin at bedtime. (Patient taking differently: Inject 52 Units into the skin at bedtime. )  . insulin lispro (HUMALOG KWIKPEN) 100 UNIT/ML KiwkPen Administer 4 units prior to breakfast and lunch and 8 units prior to evening meal.  . Insulin Pen Needle (B-D UF III MINI PEN NEEDLES) 31G X 5 MM MISC USE TWICE A DAY  . ipratropium (ATROVENT) 0.03 % nasal spray PLACE 2 SPRAYS INTO BOTH NOSTRILS 2 TIMES DAILY  . KLOR-CON M20 20 MEQ tablet TAKE 1 TABLET BY MOUTH EVERY DAY  . LORazepam (ATIVAN) 0.5 MG tablet TAKE 2 TABLETS TWICE A DAY AS NEEDED  . losartan (COZAAR) 100 MG tablet TAKE 1 TABLET BY MOUTH ONCE DAILY  . metFORMIN (GLUCOPHAGE) 1000 MG tablet TAKE 1 TABLET BY MOUTH TWICE A DAY  . metoprolol (LOPRESSOR) 50 MG tablet TAKE 1 TABLET  BY MOUTH EVERY DAY  . montelukast (SINGULAIR) 10 MG tablet TAKE 1 TABLET BY MOUTH EVERY DAY AT BEDTIME  . Multiple Vitamins-Minerals (CENTRUM SILVER PO) Take 1 tablet by mouth daily.    . OMEGA 3 1200 MG CAPS Take 1,000 mg by mouth daily.   Marland Kitchen omeprazole (PRILOSEC) 20 MG capsule Take 20 mg by mouth daily.  . ONE TOUCH LANCETS MISC USE TO CHECK BLOOD SUGAR DAILY AND PRN  . terazosin (HYTRIN) 5 MG capsule TAKE 1 CAPSULE EVERY DAY  . Triamcinolone Acetonide (NASACORT AQ NA) Place 2 sprays into  the nose as needed.   . TRULICITY 1.5 WG/9.5AO SOPN INJECT 1.5 MG INTO THE SKIN ONCE A WEEK.  . VENTOLIN HFA 108 (90 Base) MCG/ACT inhaler INHALE 2 PUFFS INTO THE LUNGS EVERY 4 HOURS AS NEEDED FOR WHEEZING  . XIIDRA 5 % SOLN PLACE 1 DROP INTO EACH EYE TWICE A DAY    FAMILY HISTORY:  His indicated that his mother is deceased. He indicated that his father is deceased. He indicated that his sister is deceased. He indicated that his brother is deceased. He indicated that his maternal grandmother is deceased. He indicated that his maternal grandfather is deceased. He indicated that his paternal grandmother is deceased. He indicated that his paternal grandfather is deceased. He indicated that his daughter is alive. He indicated that his son is alive. He indicated that the status of his neg hx is unknown.    SOCIAL HISTORY: He  reports that he quit smoking about 12 years ago. He quit after 30.00 years of use. He has never used smokeless tobacco. He reports that he does not drink alcohol or use drugs.  REVIEW OF SYSTEMS:   Unable  SUBJECTIVE:  Not responsive on vent   VITAL SIGNS: BP 102/64   Pulse 83   Resp 16   SpO2 100%   HEMODYNAMICS:    VENTILATOR SETTINGS: Vent Mode: PRVC FiO2 (%):  [100 %] 100 % Set Rate:  [16 bmp-20 bmp] 20 bmp Vt Set:  [490 mL] 490 mL PEEP:  [5 cmH20] 5 cmH20 Plateau Pressure:  [13 cmH20] 13 cmH20  INTAKE / OUTPUT: No intake/output data recorded.  PHYSICAL EXAMINATION: General:  Well nourished 67 year old male currently minimally responsive on full vent support Neuro:  Will not open eyes or f/c. Decerebrate posturing BUT nursing staff states was reaching/localizing earlier  HEENT:  Orally intubated. C-collar in place  Cardiovascular:  RRR w/out MRG Lungs:  Clear and w/out accessory use  Abdomen:  Soft, not tender + bowel sounds  Musculoskeletal:  Equal st and bulk  Skin:  Warm and dry   LABS:  BMET  Recent Labs Lab 07/09/2016 0902 07/17/2016 0916   NA 139 142  K 4.4 3.8  CL 107 107  CO2 22  --   BUN 15 18  CREATININE 1.26* 1.00  GLUCOSE 345* 339*    Electrolytes  Recent Labs Lab 07/26/2016 0902  CALCIUM 8.1*  MG 1.6*    CBC  Recent Labs Lab 07/05/2016 0902 07/19/2016 0916  WBC 5.8  --   HGB 8.6* 9.2*  HCT 29.2* 27.0*  PLT 337  --     Coag's  Recent Labs Lab 07/23/2016 0902  INR 1.25    Sepsis Markers  Recent Labs Lab 07/07/2016 0915  LATICACIDVEN 2.42*    ABG  Recent Labs Lab 07/24/2016 0931  PHART 7.332*  PCO2ART 51.5*  PO2ART 472.0*    Liver Enzymes  Recent Labs Lab 07/24/2016  0902  AST 49*  ALT 19  ALKPHOS 108  BILITOT 0.9  ALBUMIN 2.4*    Cardiac Enzymes No results for input(s): TROPONINI, PROBNP in the last 168 hours.  Glucose No results for input(s): GLUCAP in the last 168 hours.  Imaging Dg Chest Portable 1 View  Result Date: 07/17/2016 CLINICAL DATA:  CPR, unresponsive, intubated EXAM: PORTABLE CHEST 1 VIEW COMPARISON:  06/25/2016 FINDINGS: Cardiomediastinal silhouette is stable. No infiltrate or pulmonary edema. There is endotracheal tube in place with tip 2.7 cm in diameter. Defibrillator device and wire artifacts patches overlying the left chest wall. No pneumothorax. IMPRESSION: No infiltrate or pulmonary edema. Endotracheal tube in place. No pneumothorax. Electronically Signed   By: Lahoma Crocker M.D.   On: 07/19/2016 09:24     STUDIES:  CT head 2/22: no bleed Left heart cath 2/22>>>  CULTURES:   ANTIBIOTICS:   SIGNIFICANT EVENTS: 2/22 admitted s/p VF arrest. Time to ROSC estimated at 7 minutes. Rushed to cardiac cath lab.   LINES/TUBES: OETT 2/22>>>  DISCUSSION: 66 year old male being admitted s/p VF arrest. Time to ROSC 7 minutes so will go with aggressive hypothermia protocol. Headed to cath lab now. Will place central access on arrival to ICU. Family updated on plan of care and in agreement w/ plan   ASSESSMENT / PLAN:  PULMONARY A: Cardiopulmonary arrest  w/ resultant acute respiratory failure  P:   Full vent support  Hypothermia protocol  CARDIOVASCULAR A:  VF arrest P:  IV hydration Emergent cath lab Further recs per cards  RENAL A:   Hypomagnesemia  Mild lactic acidosis s/p cardiac arrest P:   IV hydration  Replace Mg Repeat Chem in am   GASTROINTESTINAL A:   No acute  P:   Re-eval for tube feeds on 2/22 PPI for SUP  HEMATOLOGIC A:   Anemia (h/o FESO4 def) P:  Trend CBC Anticoagulation per cards  INFECTIOUS A:   No evidence of infection  P:   Trend wbc curve   ENDOCRINE A:   DM w/ Hyperglycemia  P:   ssi protocol   NEUROLOGIC A:   Acute encephalopathy s/p arrest ->initial CT head negative for bleed. Some chronic grey matter disease  S/p fall Remote CVA P:   RASS goal: -4 Hypothermia protocol    FAMILY  - Updates:   - Inter-disciplinary family meet or Palliative Care meeting due by: 2/29  My ccm time 32 minutes   Erick Colace ACNP-BC [place] Pager # [phone] OR # [phone] if no answer   07/20/2016, 10:06 AM   STAFF NOTE: I, Merrie Roof, MD FACP have personally reviewed patient's available data, including medical history, events of note, physical examination and test results as part of my evaluation. I have discussed with resident/NP and other care providers such as pharmacist, RN and RRT. In addition, I personally evaluated patient and elicited key findings of: Not awake, per 2 mm, small bruise over rt forward, lungs coarse slight, jvd noted, no edema, not responding to voice, he does some movement upper ext to pain sternal rub, ecg with nonspecific changes,recived shock from AED, r/o ischemic event, stat head CT neg bleed reviewed with radiology, will need emergent cath, no bleed =- would use hypothermia protocol, to goal 32-34 C, to cath lab now, d/w cath team to place pads stat prior to cath, consider amio, cards called , keep collar neck until able to clear  clinically, seems this is witnessed overall had some bystandcer cpr  and VT/VF - aggressive care indicated, will place line after cath and a line, ipdated wife in full The patient is critically ill with multiple organ systems failure and requires high complexity decision making for assessment and support, frequent evaluation and titration of therapies, application of advanced monitoring technologies and extensive interpretation of multiple databases.   Critical Care Time devoted to patient care services described in this note is50 Minutes. This time reflects time of care of this signee: Merrie Roof, MD FACP. This critical care time does not reflect procedure time, or teaching time or supervisory time of PA/NP/Med student/Med Resident etc but could involve care discussion time. Rest per NP/medical resident whose note is outlined above and that I agree with   [name]. Titus Mould, MD, [place] Pgr: [facility] 07/22/2016 11:18 AM

## 2016-07-25 NOTE — Procedures (Signed)
Arterial Catheter Insertion Procedure Note [name] [id number] 1951-02-02  Procedure: Insertion of Arterial Catheter  Indications: Blood pressure monitoring and Frequent blood sampling  Procedure Details Consent: Risks of procedure as well as the alternatives and risks of each were explained to the (patient/caregiver).  Consent for procedure obtained. Time Out: Verified patient identification, verified procedure, site/side was marked, verified correct patient position, special equipment/implants available, medications/allergies/relevent history reviewed, required imaging and test results available.  Performed  Maximum sterile technique was used including antiseptics, cap, gloves, gown, hand hygiene, mask and sheet. Skin prep: Chlorhexidine; local anesthetic administered 20 gauge catheter was inserted into right radial artery using the Seldinger technique.  Evaluation Blood flow good; BP tracing good. Complications: No apparent complications.   [name] 07/22/2016

## 2016-07-26 DIAGNOSIS — J9601 Acute respiratory failure with hypoxia: Secondary | ICD-10-CM

## 2016-07-26 DIAGNOSIS — J9600 Acute respiratory failure, unspecified whether with hypoxia or hypercapnia: Secondary | ICD-10-CM

## 2016-07-26 LAB — BLOOD GAS, ARTERIAL
Acid-base deficit: 2.7 mmol/L — ABNORMAL HIGH (ref 0.0–2.0)
Bicarbonate: 22.1 mmol/L (ref 20.0–28.0)
DRAWN BY: 25203
FIO2: 40
O2 SAT: 98.8 %
PEEP: 5 cmH2O
PH ART: 7.398 (ref 7.350–7.450)
Patient temperature: 92.2
RATE: 20 resp/min
VT: 490 mL
pCO2 arterial: 34.9 mmHg (ref 32.0–48.0)
pO2, Arterial: 126 mmHg — ABNORMAL HIGH (ref 83.0–108.0)

## 2016-07-26 LAB — BASIC METABOLIC PANEL
ANION GAP: 14 (ref 5–15)
ANION GAP: 11 (ref 5–15)
Anion gap: 9 (ref 5–15)
Anion gap: 10 (ref 5–15)
Anion gap: 9 (ref 5–15)
Anion gap: 10 (ref 5–15)
BUN: 14 mg/dL (ref 6–20)
BUN: 16 mg/dL (ref 6–20)
BUN: 12 mg/dL (ref 6–20)
BUN: 13 mg/dL (ref 6–20)
BUN: 12 mg/dL (ref 6–20)
BUN: 14 mg/dL (ref 6–20)
CALCIUM: 8.5 mg/dL — AB (ref 8.9–10.3)
CALCIUM: 8.6 mg/dL — AB (ref 8.9–10.3)
CALCIUM: 8.3 mg/dL — AB (ref 8.9–10.3)
CHLORIDE: 110 mmol/L (ref 101–111)
CHLORIDE: 110 mmol/L (ref 101–111)
CHLORIDE: 108 mmol/L (ref 101–111)
CO2: 23 mmol/L (ref 22–32)
CO2: 20 mmol/L — ABNORMAL LOW (ref 22–32)
CO2: 21 mmol/L — ABNORMAL LOW (ref 22–32)
CO2: 21 mmol/L — ABNORMAL LOW (ref 22–32)
CO2: 22 mmol/L (ref 22–32)
CO2: 22 mmol/L (ref 22–32)
CREATININE: 0.78 mg/dL (ref 0.61–1.24)
CREATININE: 0.73 mg/dL (ref 0.61–1.24)
Calcium: 8.3 mg/dL — ABNORMAL LOW (ref 8.9–10.3)
Calcium: 8.4 mg/dL — ABNORMAL LOW (ref 8.9–10.3)
Calcium: 8.4 mg/dL — ABNORMAL LOW (ref 8.9–10.3)
Chloride: 111 mmol/L (ref 101–111)
Chloride: 106 mmol/L (ref 101–111)
Chloride: 108 mmol/L (ref 101–111)
Creatinine, Ser: 0.74 mg/dL (ref 0.61–1.24)
Creatinine, Ser: 0.77 mg/dL (ref 0.61–1.24)
Creatinine, Ser: 0.74 mg/dL (ref 0.61–1.24)
Creatinine, Ser: 0.68 mg/dL (ref 0.61–1.24)
GFR calc Af Amer: >60 mL/min (ref >60)
GFR calc non Af Amer: >60 mL/min (ref >60)
GLUCOSE: 183 mg/dL — AB (ref 65–99)
Glucose, Bld: 154 mg/dL — ABNORMAL HIGH (ref 65–99)
Glucose, Bld: 169 mg/dL — ABNORMAL HIGH (ref 65–99)
Glucose, Bld: 194 mg/dL — ABNORMAL HIGH (ref 65–99)
Glucose, Bld: 216 mg/dL — ABNORMAL HIGH (ref 65–99)
Glucose, Bld: 186 mg/dL — ABNORMAL HIGH (ref 65–99)
POTASSIUM: 3.4 mmol/L — AB (ref 3.5–5.1)
POTASSIUM: 3.6 mmol/L (ref 3.5–5.1)
POTASSIUM: 3.8 mmol/L (ref 3.5–5.1)
POTASSIUM: 3.9 mmol/L (ref 3.5–5.1)
POTASSIUM: 4 mmol/L (ref 3.5–5.1)
Potassium: 4.4 mmol/L (ref 3.5–5.1)
SODIUM: 140 mmol/L (ref 135–145)
SODIUM: 140 mmol/L (ref 135–145)
SODIUM: 141 mmol/L (ref 135–145)
SODIUM: 141 mmol/L (ref 135–145)
SODIUM: 143 mmol/L (ref 135–145)
Sodium: 140 mmol/L (ref 135–145)

## 2016-07-26 LAB — GLUCOSE, CAPILLARY
GLUCOSE-CAPILLARY: 133 mg/dL — AB (ref 65–99)
GLUCOSE-CAPILLARY: 147 mg/dL — AB (ref 65–99)
GLUCOSE-CAPILLARY: 158 mg/dL — AB (ref 65–99)
GLUCOSE-CAPILLARY: 161 mg/dL — AB (ref 65–99)
GLUCOSE-CAPILLARY: 162 mg/dL — AB (ref 65–99)
Glucose-Capillary: 326 mg/dL — ABNORMAL HIGH (ref 65–99)
Glucose-Capillary: 162 mg/dL — ABNORMAL HIGH (ref 65–99)
Glucose-Capillary: 165 mg/dL — ABNORMAL HIGH (ref 65–99)
Glucose-Capillary: 182 mg/dL — ABNORMAL HIGH (ref 65–99)
Glucose-Capillary: 159 mg/dL — ABNORMAL HIGH (ref 65–99)
Glucose-Capillary: 163 mg/dL — ABNORMAL HIGH (ref 65–99)
Glucose-Capillary: 158 mg/dL — ABNORMAL HIGH (ref 65–99)
Glucose-Capillary: 172 mg/dL — ABNORMAL HIGH (ref 65–99)
Glucose-Capillary: 180 mg/dL — ABNORMAL HIGH (ref 65–99)
Glucose-Capillary: 205 mg/dL — ABNORMAL HIGH (ref 65–99)
Glucose-Capillary: 174 mg/dL — ABNORMAL HIGH (ref 65–99)

## 2016-07-26 LAB — CBC
HCT: 30 % — ABNORMAL LOW (ref 39.0–52.0)
Hemoglobin: 8.9 g/dL — ABNORMAL LOW (ref 13.0–17.0)
MCH: 23.9 pg — AB (ref 26.0–34.0)
MCHC: 29.7 g/dL — ABNORMAL LOW (ref 30.0–36.0)
MCV: 80.4 fL (ref 78.0–100.0)
PLATELETS: 358 10*3/uL (ref 150–400)
RBC: 3.73 MIL/uL — ABNORMAL LOW (ref 4.22–5.81)
RDW: 16.9 % — AB (ref 11.5–15.5)
WBC: 5 10*3/uL (ref 4.0–10.5)

## 2016-07-26 LAB — POCT I-STAT, CHEM 8
BUN: 18 mg/dL (ref 6–20)
BUN: 18 mg/dL (ref 6–20)
CALCIUM ION: 1.19 mmol/L (ref 1.15–1.40)
CHLORIDE: 104 mmol/L (ref 101–111)
CREATININE: 0.7 mg/dL (ref 0.61–1.24)
Calcium, Ion: 1.17 mmol/L (ref 1.15–1.40)
Chloride: 103 mmol/L (ref 101–111)
Creatinine, Ser: 0.9 mg/dL (ref 0.61–1.24)
Glucose, Bld: 396 mg/dL — ABNORMAL HIGH (ref 65–99)
Glucose, Bld: 344 mg/dL — ABNORMAL HIGH (ref 65–99)
HCT: 29 % — ABNORMAL LOW (ref 39.0–52.0)
HCT: 27 % — ABNORMAL LOW (ref 39.0–52.0)
Hemoglobin: 9.9 g/dL — ABNORMAL LOW (ref 13.0–17.0)
Hemoglobin: 9.2 g/dL — ABNORMAL LOW (ref 13.0–17.0)
Potassium: 4 mmol/L (ref 3.5–5.1)
Potassium: 3.7 mmol/L (ref 3.5–5.1)
SODIUM: 141 mmol/L (ref 135–145)
Sodium: 139 mmol/L (ref 135–145)
TCO2: 26 mmol/L (ref 0–100)
TCO2: 27 mmol/L (ref 0–100)

## 2016-07-26 LAB — MAGNESIUM
MAGNESIUM: 1.8 mg/dL (ref 1.7–2.4)
MAGNESIUM: 1.7 mg/dL (ref 1.7–2.4)

## 2016-07-26 LAB — TROPONIN I: Troponin I: 0.49 ng/mL (ref <0.03)

## 2016-07-26 LAB — POCT I-STAT 3, ART BLOOD GAS (G3+)
ACID-BASE DEFICIT: 4 mmol/L — AB (ref 0.0–2.0)
BICARBONATE: 22.4 mmol/L (ref 20.0–28.0)
O2 SAT: 88 %
PH ART: 7.297 — AB (ref 7.350–7.450)
PO2 ART: 60 mmHg — AB (ref 83.0–108.0)
Patient temperature: 97.7
TCO2: 24 mmol/L (ref 0–100)
pCO2 arterial: 45.6 mmHg (ref 32.0–48.0)

## 2016-07-26 LAB — PHOSPHORUS
PHOSPHORUS: 4.1 mg/dL (ref 2.5–4.6)
PHOSPHORUS: 3.5 mg/dL (ref 2.5–4.6)

## 2016-07-26 MED ORDER — SODIUM CHLORIDE 0.9 % IV SOLN
30.0000 meq | Freq: Once | INTRAVENOUS | Status: AC
  Administered 2016-07-26: 30 meq via INTRAVENOUS
  Filled 2016-07-26: qty 15

## 2016-07-26 MED ORDER — SODIUM CHLORIDE 0.9% FLUSH
10.0000 mL | INTRAVENOUS | Status: DC | PRN
  Administered 2016-07-28: 10 mL
  Filled 2016-07-26: qty 40

## 2016-07-26 MED ORDER — CHLORHEXIDINE GLUCONATE CLOTH 2 % EX PADS
6.0000 | MEDICATED_PAD | Freq: Every day | CUTANEOUS | Status: DC
  Administered 2016-07-27 – 2016-08-01 (×7): 6 via TOPICAL

## 2016-07-26 MED ORDER — MAGNESIUM SULFATE 4 GM/100ML IV SOLN
4.0000 g | Freq: Once | INTRAVENOUS | Status: AC
  Administered 2016-07-26: 4 g via INTRAVENOUS
  Filled 2016-07-26: qty 100

## 2016-07-26 MED ORDER — INSULIN ASPART 100 UNIT/ML ~~LOC~~ SOLN
2.0000 [IU] | SUBCUTANEOUS | Status: DC
  Administered 2016-07-26: 6 [IU] via SUBCUTANEOUS
  Administered 2016-07-26 (×2): 4 [IU] via SUBCUTANEOUS
  Administered 2016-07-27: 2 [IU] via SUBCUTANEOUS
  Administered 2016-07-27: 4 [IU] via SUBCUTANEOUS
  Administered 2016-07-28 (×2): 2 [IU] via SUBCUTANEOUS
  Administered 2016-07-28 (×2): 4 [IU] via SUBCUTANEOUS
  Administered 2016-07-29 – 2016-07-30 (×7): 6 [IU] via SUBCUTANEOUS

## 2016-07-26 MED ORDER — INSULIN GLARGINE 100 UNIT/ML ~~LOC~~ SOLN
10.0000 [IU] | SUBCUTANEOUS | Status: DC
  Administered 2016-07-26 – 2016-07-30 (×4): 10 [IU] via SUBCUTANEOUS
  Filled 2016-07-26 (×5): qty 0.1

## 2016-07-26 MED ORDER — SODIUM CHLORIDE 0.9% FLUSH
10.0000 mL | Freq: Two times a day (BID) | INTRAVENOUS | Status: DC
  Administered 2016-07-26 – 2016-07-27 (×2): 10 mL
  Administered 2016-07-27: 30 mL
  Administered 2016-07-28 – 2016-07-30 (×5): 10 mL
  Administered 2016-07-30: 30 mL
  Administered 2016-07-31 – 2016-08-02 (×5): 10 mL

## 2016-07-26 MED FILL — Nitroglycerin IV Soln 100 MCG/ML in D5W: INTRA_ARTERIAL | Qty: 10 | Status: AC

## 2016-07-26 NOTE — Progress Notes (Signed)
Initial Nutrition Assessment  DOCUMENTATION CODES:   Severe malnutrition in context of chronic illness  INTERVENTION:  - If medically appropriate, recommend TF - Pt may be at risk for refeeding syndrome recommend monitor potassium, magnesium, and phosphorous daily for 3 days, and MD to replete as needed.  - Vital AF 1.2 at 35 mL/hr increase by 5 mL every 8 hours, or as indicated by labs, to goal rate of 55 mL/hr (1320 mL per day) - Provides 1584 calories, 99 grams protein, and 1069 mL free water daily  NUTRITION DIAGNOSIS:   Malnutrition related to chronic illness as evidenced by severe depletion of muscle mass, moderate depletion of body fat, energy intake < 75% for > or equal to 1 month.  GOAL:   Patient will meet greater than or equal to 90% of their needs  MONITOR:   Weight trends, I & O's, Vent status  REASON FOR ASSESSMENT:   Ventilator    ASSESSMENT:   Pt with PMH of COPD, CVA, Type II DM, Hodgkin's lymphoma s/p chemotherapy, HTN, hyperlipidemia, and documentation showing left ventricular dysfunction who presents with cardiac arrest. Pt was intubated in the field.  Pt is currently intubated on ventilator support. MV: 10.0 L/min Temp (24hrs), Avg:91.4 F (33 C), Min:90.5 F (32.5 C), Max:92.3 F (33.5 C)  Pt's family at bedside assisted in collecting pt's history. Per pt's family report pt has a UBW of ~170 and has had recent weight loss of 20 lbs in 6 mo. However per chart review, pt's weight history from 6 months ago contradict this.  Pt's family reports pt has had decreased appetite over the past 3 months, stating they have had a lot of "tragic stressors" lately. Per pt's family, pt consumes only little bites of food sporadically throughout the day. For breakfast 1/2 of an egg, 1/2 piece of sausage, and a piece of toast. The rest of the meals may be just a few bites of soup, steak and potatoes, and fruit. No meal completion percentages completed for pt d/t NPO  status.  Nutrition-Focused physical exam completed. Findings are moderate fat depletion, severe muscle depletion, and no edema.  Labs reviewed; CBG [id number]) Medications reviewed; Protonix  Diet Order:  Diet NPO time specified  Skin:  Reviewed, no issues  Last BM:  2/21  Height:   Ht Readings from Last 1 Encounters:  07/18/2016 5\' 5"  (1.651 m)    Weight:   Wt Readings from Last 1 Encounters:  07/29/2016 148 lb 13 oz (67.5 kg)    Ideal Body Weight:  61.8 kg  BMI:  Body mass index is 24.76 kg/m.  Estimated Nutritional Needs:   Kcal:  1598  Protein:  95-105 grams (1.4-1.6 g/kg)  Fluid:  >/= 1.5 L/d  EDUCATION NEEDS:   Education needs no appropriate at this time  [employer]

## 2016-07-26 NOTE — H&P (Addendum)
PULMONARY / CRITICAL CARE MEDICINE   Name: [name] MRN: [id number] DOB: [birth year]    ADMISSION DATE:  07/23/2016  REFERRING MD:  Tegeler   CHIEF COMPLAINT:  VF arrest  HISTORY OF PRESENT ILLNESS:   This is a 66 year old male w/ h/o HTN, DM type II and prior CVA. Also remote h/o hodgkin's lymphoma. Found down in bathroom after a fall. CPR initiated by spouse. Initial rhythm was VF on EMS arrival. CPR continued, shocked x1. Time to ROSC estimated at 7 minutes. Pt unresponsive on arrival w/ report of possible localization to noxious stim, cough and later decerebrate posturing. Placed in C collar, CT head negative, rushed to cardiac cath lab PCCM to admit. Cath neg min cad. hypothermia  SUBJECTIVE:  rewarm started Had HTN episode overnight  VITAL SIGNS: BP 127/76   Pulse (!) 58   Temp (!) 90.9 F (32.7 C) (Core (Comment))   Resp 18   Ht 5\' 5"  (1.651 m)   Wt 67.5 kg (148 lb 13 oz)   SpO2 100%   BMI 24.76 kg/m   HEMODYNAMICS: CVP:  [2 mmHg-10 mmHg] 2 mmHg  VENTILATOR SETTINGS: Vent Mode: PRVC FiO2 (%):  [40 %-60 %] 40 % Set Rate:  [20 bmp] 20 bmp Vt Set:  [490 mL] 490 mL PEEP:  [5 cmH20] 5 cmH20 Plateau Pressure:  [15 cmH20-17 cmH20] 17 cmH20  INTAKE / OUTPUT: I/O last 3 completed shifts: In: 2865.8 [I.V.:2665.8; Other:150; IV Piggyback:50] Out: 2670 [Urine:2670]  PHYSICAL EXAMINATION: General:  Well nourished 66 year old male Neuro:  Paralyzed, sedated HEENT:  Ett, jvd wnl, line clean Cardiovascular:  s1 s2 RRR w/out MRG Lungs:  reduced Abdomen:  Soft, not tender + bowel sounds  Musculoskeletal:  Equal st and bulk  Skin:  Warm and dry   LABS:  BMET  Recent Labs Lab 07/26/16 0016 07/26/16 0355 07/26/16 0735  NA 143 140 140  K 3.4* 3.6 3.8  CL 111 106 108  CO2 23 20* 21*  BUN 14 16 12   CREATININE 0.74 0.77 0.74  GLUCOSE 154* 183* 169*    Electrolytes  Recent Labs Lab 07/15/2016 0902  07/26/16 0016 07/26/16 0355 07/26/16 0735  CALCIUM 8.1*   < > 8.6* 8.5* 8.3*  MG 1.6*  --   --  1.8  --   PHOS  --   --   --  4.1  --   < > = values in this interval not displayed.  CBC  Recent Labs Lab 07/24/2016 0902  07/06/2016 1018 07/09/2016 1304 07/14/2016 1443 07/26/16 0355  WBC 5.8  --  5.8  --   --  5.0  HGB 8.6*  < > 8.9* 9.9* 9.2* 8.9*  HCT 29.2*  < > 30.7* 29.0* 27.0* 30.0*  PLT 337  --  355  --   --  358  < > = values in this interval not displayed.  Coag's  Recent Labs Lab 07/12/2016 0902 07/10/2016 1018 07/12/2016 1740  APTT  --  40* 49*  INR 1.25 1.20 1.22    Sepsis Markers  Recent Labs Lab 07/04/2016 0915 07/04/2016 1038  LATICACIDVEN 2.42* 0.8    ABG  Recent Labs Lab 07/14/2016 0931 07/18/2016 1228 07/26/16 0300  PHART 7.332* 7.477* 7.398  PCO2ART 51.5* 37.5 34.9  PO2ART 472.0* 520.0* 126*    Liver Enzymes  Recent Labs Lab 07/22/2016 0902  AST 49*  ALT 19  ALKPHOS 108  BILITOT 0.9  ALBUMIN 2.4*    Cardiac Enzymes  Recent Labs Lab 07/20/2016 1529 07/30/2016 1943 07/26/16 0355  TROPONINI 0.46* 0.54* 0.49*    Glucose  Recent Labs Lab 07/26/16 0515 07/26/16 0605 07/26/16 0722 07/26/16 0806 07/26/16 0854 07/26/16 1153  GLUCAP 163* 158* 158* 172* 162* 180*    Imaging Dg Chest Port 1 View  Result Date: 07/22/2016 CLINICAL DATA:  Status post central line and orogastric tube placement. EXAM: PORTABLE CHEST 1 VIEW COMPARISON:  Chest x-ray of 9:09 a.m. today FINDINGS: The orogastric tube tip and proximal port project below the left hemidiaphragm and off the inferior margin of the image. The right internal jugular venous catheter tip projects over the midportion of the SVC. There is no postprocedure pneumothorax. The lungs are adequately inflated. The endotracheal tube tip lies approximately 4.3 cm above the carina. There is increased density in the left retrocardiac region compatible with atelectasis. The cardiac silhouette is normal in size. The pulmonary vascularity is not engorged. IMPRESSION: No  postprocedure complication following right internal jugular venous catheter placement. The orogastric tube tip and proximal port project below the inferior margin of the image. Left lower lobe density more conspicuous than on the earlier study likely reflects developing atelectasis. Electronically Signed   By: David  Martinique M.D.   On: 07/09/2016 14:52     STUDIES:  CT head 2/22: no bleed Left heart cath 2/22>>>cad min 40% Echo 2/22>>>LV wnl, R Vnormal eeg 2.22 no focus, global suppression  CULTURES:   ANTIBIOTICS:   SIGNIFICANT EVENTS: 2/22 admitted s/p VF arrest. Time to ROSC estimated at 7 minutes. Rushed to cardiac cath lab.   LINES/TUBES: OETT 2/22>>> 2.22 rt ij>>> 2/22 rt rad aline>>  DISCUSSION: 66 year old male being admitted s/p VF arrest. Time to ROSC 7 minutes so will go with aggressive hypothermia protocol. Headed to cath lab now. Will place central access on arrival to ICU. Family updated on plan of care and in agreement w/ plan   ASSESSMENT / PLAN:  PULMONARY A: Cardiopulmonary arrest w/ resultant acute respiratory failure  P:   ABG reviewed On rewarm After rewarm obtain abg pcxr in am  pcxr again in am   CARDIOVASCULAR A:  VF arrest - unclear etiology LV fxn wnl RV wnl, unlikley PE Syncope associayed with vf HTN event 2/22 resolved after foley irrigated P:  Tele To map goal 60 with rewarm Carotid study needed  RENAL A:   Hypomagnesemia  Mild lactic acidosis s/p cardiac arrest P:   avoid free water as risk brain edema kvo  GASTROINTESTINAL A:   No acute  P:   Re-eval for tube feeds on 2/23 in am after rewarm PPI for SUP  HEMATOLOGIC A:   Anemia (h/o FESO4 def) P:  Trend CBC ensure sub q hep  INFECTIOUS A:   No evidence of infection  P:   Trend temp after rewarm keep normo however  ENDOCRINE A:   DM w/ Hyperglycemia  P:   ssi protocol   NEUROLOGIC A:   Acute encephalopathy s/p arrest ->initial CT head negative for  bleed. Some chronic grey matter disease  S/p fall Remote CVA R/o [place] as cause arrest P:   RASS goal: -1 at end rewarm Dc al sedaiton and nimbex at end rewarm I would consider MRI brain am  Keep collar until able to clear clinically appears HTNB event was related to foley Get vascualr US carotids eeg neg focus  FAMILY  - Updates: I udpated wife   - Inter-disciplinary family meet or Palliative Care meeting due  by: 2/29  Ccm time 35 min    [name]. Titus Mould, MD, [place] Pgr: [facility] 07/26/2016 12:06 PM

## 2016-07-26 NOTE — Progress Notes (Signed)
Progress Note  Patient Name: [name] Date of Encounter: 07/26/2016  Primary Cardiologist: Nahser  Subjective   66 yo found down by family. Family started CPR,  Called EMS EMS used an AED , 1 shock - achieved  ROSC Cath yesterday reveals minimal CAD ( LAD with 40% mid lesion)  LV function is mildly depressed.  He is on [employer] cooling protocol   Inpatient Medications    Scheduled Meds: . artificial tears  1 application Both Eyes G1W  . aspirin  81 mg Oral Daily  . atorvastatin  10 mg Per Tube Daily  . chlorhexidine gluconate (MEDLINE KIT)  15 mL Mouth Rinse BID  . heparin  5,000 Units Subcutaneous Q8H  . insulin aspart  2-6 Units Subcutaneous Q4H  . insulin glargine  10 Units Subcutaneous Q24H  . mouth rinse  15 mL Mouth Rinse 10 times per day  . pantoprazole (PROTONIX) IV  40 mg Intravenous QHS  . sodium chloride flush  3 mL Intravenous Q12H   Continuous Infusions: . sodium chloride    . sodium chloride 75 mL/hr at 07/26/16 0700  . cisatracurium (NIMBEX) infusion 1.003 mcg/kg/min (07/26/16 0700)  . fentaNYL infusion INTRAVENOUS 200 mcg/hr (07/26/16 0700)  . midazolam (VERSED) infusion 4 mg/hr (07/26/16 0700)  . niCARDipine Stopped (07/26/16 0052)  . norepinephrine (LEVOPHED) Adult infusion    . propofol (DIPRIVAN) infusion     PRN Meds: sodium chloride, sodium chloride, acetaminophen, [COMPLETED] cisatracurium **AND** cisatracurium (NIMBEX) infusion **AND** cisatracurium, fentaNYL, hydrALAZINE, ondansetron (ZOFRAN) IV, sodium chloride flush   Vital Signs    Vitals:   07/26/16 0600 07/26/16 0700 07/26/16 0735 07/26/16 0800  BP: 125/69 116/70 116/70 (!) 122/59  Pulse: 60 61  65  Resp: 18 20  (!) 3  Temp: (!) 90.9 F (32.7 C) (!) 91 F (32.8 C)  (!) 91.6 F (33.1 C)  TempSrc: Core (Comment) Core (Comment)  Core (Comment)  SpO2: 100% 100% 100% 100%  Weight:      Height:        Intake/Output Summary (Last 24 hours) at 07/26/16 0903 Last data filed  at 07/26/16 0800  Gross per 24 hour  Intake          2969.98 ml  Output             2670 ml  Net           299.98 ml   Filed Weights   07/12/2016 1400  Weight: 148 lb 13 oz (67.5 kg)    Telemetry    NSR  - Personally Reviewed  ECG    NSR  - Personally Reviewed  Physical Exam   GEN: VVS review.   Sedated , on vent   Neck: No JVD Cardiac: RRR, no murmurs, rubs, or gallops.  Respiratory: Clear to auscultation bilaterally. GI: Soft, nontender, non-distended  MS: No edema; No deformity. Neuro:  NA Psych: NA  Labs    Chemistry Recent Labs Lab 07/14/2016 0902  07/26/16 0016 07/26/16 0355 07/26/16 0735  NA 139  < > 143 140 140  K 4.4  < > 3.4* 3.6 3.8  CL 107  < > 111 106 108  CO2 22  < > 23 20* 21*  GLUCOSE 345*  < > 154* 183* 169*  BUN 15  < > '14 16 12  '$ CREATININE 1.26*  < > 0.74 0.77 0.74  CALCIUM 8.1*  < > 8.6* 8.5* 8.3*  PROT 6.2*  --   --   --   --  ALBUMIN 2.4*  --   --   --   --   AST 49*  --   --   --   --   ALT 19  --   --   --   --   ALKPHOS 108  --   --   --   --   BILITOT 0.9  --   --   --   --   GFRNONAA 57*  < > >60 >60 >60  GFRAA >60  < > >60 >60 >60  ANIONGAP 10  < > '9 14 11  '$ < > = values in this interval not displayed.   Hematology Recent Labs Lab 07/08/2016 0902  07/19/2016 1018 07/07/2016 1304 07/08/2016 1443 07/26/16 0355  WBC 5.8  --  5.8  --   --  5.0  RBC 3.56*  --  3.78*  --   --  3.73*  HGB 8.6*  < > 8.9* 9.9* 9.2* 8.9*  HCT 29.2*  < > 30.7* 29.0* 27.0* 30.0*  MCV 82.0  --  81.2  --   --  80.4  MCH 24.2*  --  23.5*  --   --  23.9*  MCHC 29.5*  --  29.0*  --   --  29.7*  RDW 17.5*  --  17.2*  --   --  16.9*  PLT 337  --  355  --   --  358  < > = values in this interval not displayed.  Cardiac Enzymes Recent Labs Lab 07/05/2016 1018 07/31/2016 1529 07/14/2016 1943 07/26/16 0355  TROPONINI 0.19* 0.46* 0.54* 0.49*    Recent Labs Lab 07/28/2016 0913  TROPIPOC 0.04     BNPNo results for input(s): BNP, PROBNP in the last 168 hours.     DDimer No results for input(s): DDIMER in the last 168 hours.   Radiology      Cardiac Studies    Patient Profile     66 y.o. male with cardiorespiratory arrest .   Assessment & Plan    1. Cardiac arrest:   At this point, it is difficult to say what the initial event was.    He has no significant CAD,  Troponins are minimally elevated and the curve is relatively flat - c/w CPR and defibrillation - not an ACS. LV function is only mildly depressed   BP was elevated last night.  Currently BP is normal   Further work up per [employer] team .   Signed, Mertie Moores, MD  07/26/2016, 9:03 AM

## 2016-07-27 ENCOUNTER — Inpatient Hospital Stay (HOSPITAL_COMMUNITY): Payer: Medicare Other

## 2016-07-27 DIAGNOSIS — E43 Unspecified severe protein-calorie malnutrition: Secondary | ICD-10-CM | POA: Insufficient documentation

## 2016-07-27 LAB — COMPREHENSIVE METABOLIC PANEL
ALT: 24 U/L (ref 17–63)
AST: 36 U/L (ref 15–41)
Albumin: 2.2 g/dL — ABNORMAL LOW (ref 3.5–5.0)
Alkaline Phosphatase: 99 U/L (ref 38–126)
Anion gap: 11 (ref 5–15)
BUN: 14 mg/dL (ref 6–20)
CHLORIDE: 110 mmol/L (ref 101–111)
CO2: 21 mmol/L — AB (ref 22–32)
CREATININE: 1.01 mg/dL (ref 0.61–1.24)
Calcium: 8.7 mg/dL — ABNORMAL LOW (ref 8.9–10.3)
GFR calc Af Amer: >60 mL/min (ref >60)
GFR calc non Af Amer: >60 mL/min (ref >60)
Glucose, Bld: 103 mg/dL — ABNORMAL HIGH (ref 65–99)
Potassium: 4.8 mmol/L (ref 3.5–5.1)
SODIUM: 142 mmol/L (ref 135–145)
Total Bilirubin: 0.7 mg/dL (ref 0.3–1.2)
Total Protein: 6.2 g/dL — ABNORMAL LOW (ref 6.5–8.1)

## 2016-07-27 LAB — POCT I-STAT 3, ART BLOOD GAS (G3+)
ACID-BASE DEFICIT: 6 mmol/L — AB (ref 0.0–2.0)
ACID-BASE DEFICIT: 7 mmol/L — AB (ref 0.0–2.0)
Acid-base deficit: 5 mmol/L — ABNORMAL HIGH (ref 0.0–2.0)
BICARBONATE: 20.9 mmol/L (ref 20.0–28.0)
Bicarbonate: 21.7 mmol/L (ref 20.0–28.0)
Bicarbonate: 22.3 mmol/L (ref 20.0–28.0)
O2 SAT: 89 %
O2 SAT: 94 %
O2 Saturation: 93 %
PCO2 ART: 49.8 mmHg — AB (ref 32.0–48.0)
PH ART: 7.217 — AB (ref 7.350–7.450)
PO2 ART: 69 mmHg — AB (ref 83.0–108.0)
Patient temperature: 99.8
TCO2: 23 mmol/L (ref 0–100)
TCO2: 24 mmol/L (ref 0–100)
TCO2: 22 mmol/L (ref 0–100)
pCO2 arterial: 54.7 mmHg — ABNORMAL HIGH (ref 32.0–48.0)
pCO2 arterial: 51.4 mmHg — ABNORMAL HIGH (ref 32.0–48.0)
pH, Arterial: 7.25 — ABNORMAL LOW (ref 7.350–7.450)
pH, Arterial: 7.219 — ABNORMAL LOW (ref 7.350–7.450)
pO2, Arterial: 84 mmHg (ref 83.0–108.0)
pO2, Arterial: 85 mmHg (ref 83.0–108.0)

## 2016-07-27 LAB — GLUCOSE, CAPILLARY
GLUCOSE-CAPILLARY: 118 mg/dL — AB (ref 65–99)
GLUCOSE-CAPILLARY: 92 mg/dL (ref 65–99)
GLUCOSE-CAPILLARY: 113 mg/dL — AB (ref 65–99)
Glucose-Capillary: 99 mg/dL (ref 65–99)
Glucose-Capillary: 83 mg/dL (ref 65–99)

## 2016-07-27 LAB — CBC WITH DIFFERENTIAL/PLATELET
BASOS PCT: 0 %
Basophils Absolute: 0 10*3/uL (ref 0.0–0.1)
EOS PCT: 0 %
Eosinophils Absolute: 0 10*3/uL (ref 0.0–0.7)
HCT: 33.6 % — ABNORMAL LOW (ref 39.0–52.0)
Hemoglobin: 9.8 g/dL — ABNORMAL LOW (ref 13.0–17.0)
LYMPHS ABS: 0.6 10*3/uL — AB (ref 0.7–4.0)
Lymphocytes Relative: 4 %
MCH: 23.9 pg — AB (ref 26.0–34.0)
MCHC: 29.2 g/dL — ABNORMAL LOW (ref 30.0–36.0)
MCV: 82 fL (ref 78.0–100.0)
MONO ABS: 0.5 10*3/uL (ref 0.1–1.0)
Monocytes Relative: 3 %
NEUTROS PCT: 93 %
Neutro Abs: 14.6 10*3/uL — ABNORMAL HIGH (ref 1.7–7.7)
PLATELETS: 476 10*3/uL — AB (ref 150–400)
RBC: 4.1 MIL/uL — ABNORMAL LOW (ref 4.22–5.81)
RDW: 17.3 % — ABNORMAL HIGH (ref 11.5–15.5)
WBC: 15.7 10*3/uL — ABNORMAL HIGH (ref 4.0–10.5)

## 2016-07-27 LAB — BASIC METABOLIC PANEL
ANION GAP: 10 (ref 5–15)
BUN: 13 mg/dL (ref 6–20)
CHLORIDE: 107 mmol/L (ref 101–111)
CO2: 22 mmol/L (ref 22–32)
Calcium: 8.4 mg/dL — ABNORMAL LOW (ref 8.9–10.3)
Creatinine, Ser: 0.86 mg/dL (ref 0.61–1.24)
GFR calc Af Amer: >60 mL/min (ref >60)
GFR calc non Af Amer: >60 mL/min (ref >60)
GLUCOSE: 168 mg/dL — AB (ref 65–99)
POTASSIUM: 4.5 mmol/L (ref 3.5–5.1)
Sodium: 139 mmol/L (ref 135–145)

## 2016-07-27 LAB — PHOSPHORUS
PHOSPHORUS: 4.8 mg/dL — AB (ref 2.5–4.6)
PHOSPHORUS: 6.3 mg/dL — AB (ref 2.5–4.6)

## 2016-07-27 LAB — MAGNESIUM
Magnesium: 2.2 mg/dL (ref 1.7–2.4)
Magnesium: 2.1 mg/dL (ref 1.7–2.4)

## 2016-07-27 MED ORDER — FUROSEMIDE 10 MG/ML IJ SOLN
40.0000 mg | Freq: Once | INTRAMUSCULAR | Status: AC
  Administered 2016-07-27: 40 mg via INTRAVENOUS
  Filled 2016-07-27: qty 4

## 2016-07-27 MED ORDER — VITAL AF 1.2 CAL PO LIQD
1000.0000 mL | ORAL | Status: DC
Start: 2016-07-27 — End: 2016-08-02
  Administered 2016-07-28 – 2016-08-02 (×6): 1000 mL
  Filled 2016-07-27 (×3): qty 1000

## 2016-07-27 MED ORDER — SODIUM CHLORIDE 0.9 % IV SOLN
25.0000 ug/h | INTRAVENOUS | Status: DC
  Administered 2016-07-27: 250 ug/h via INTRAVENOUS
  Administered 2016-07-27: 200 ug/h via INTRAVENOUS
  Administered 2016-07-28 (×3): 400 ug/h via INTRAVENOUS
  Administered 2016-07-30: 175 ug/h via INTRAVENOUS
  Administered 2016-07-31: 200 ug/h via INTRAVENOUS
  Administered 2016-07-31: 250 ug/h via INTRAVENOUS
  Administered 2016-08-01: 300 ug/h via INTRAVENOUS
  Administered 2016-08-01: 350 ug/h via INTRAVENOUS
  Administered 2016-08-02: 300 ug/h via INTRAVENOUS
  Administered 2016-08-02: 400 ug/h via INTRAVENOUS
  Filled 2016-07-27 (×17): qty 50

## 2016-07-27 MED ORDER — METOPROLOL TARTRATE 5 MG/5ML IV SOLN
5.0000 mg | Freq: Two times a day (BID) | INTRAVENOUS | Status: DC
Start: 2016-07-27 — End: 2016-08-02
  Administered 2016-07-27 – 2016-08-02 (×11): 5 mg via INTRAVENOUS
  Filled 2016-07-27 (×12): qty 5

## 2016-07-27 MED ORDER — PRO-STAT SUGAR FREE PO LIQD
30.0000 mL | Freq: Two times a day (BID) | ORAL | Status: DC
  Administered 2016-07-27: 30 mL
  Filled 2016-07-27: qty 30

## 2016-07-27 MED ORDER — VITAL HIGH PROTEIN PO LIQD
1000.0000 mL | ORAL | Status: DC
  Administered 2016-07-27: 40 mL/h

## 2016-07-27 MED ORDER — FENTANYL CITRATE (PF) 100 MCG/2ML IJ SOLN
50.0000 ug | Freq: Once | INTRAMUSCULAR | Status: AC
  Administered 2016-07-27: 50 ug via INTRAVENOUS

## 2016-07-27 MED ORDER — HYDRALAZINE HCL 20 MG/ML IJ SOLN
10.0000 mg | INTRAMUSCULAR | Status: DC | PRN
  Filled 2016-07-27: qty 1

## 2016-07-27 MED ORDER — PIPERACILLIN-TAZOBACTAM 3.375 G IVPB
3.3750 g | Freq: Three times a day (TID) | INTRAVENOUS | Status: DC
  Administered 2016-07-27 – 2016-07-29 (×7): 3.375 g via INTRAVENOUS
  Filled 2016-07-27 (×8): qty 50

## 2016-07-27 MED ORDER — FENTANYL BOLUS VIA INFUSION
25.0000 ug | INTRAVENOUS | Status: DC | PRN
  Administered 2016-08-01 – 2016-08-02 (×10): 25 ug via INTRAVENOUS
  Filled 2016-07-27: qty 25

## 2016-07-27 MED ORDER — ACETAMINOPHEN 160 MG/5ML PO SOLN
650.0000 mg | Freq: Four times a day (QID) | ORAL | Status: DC | PRN
  Administered 2016-07-27: 650 mg
  Filled 2016-07-27: qty 20.3

## 2016-07-27 MED ORDER — SODIUM CHLORIDE 0.9 % IV SOLN: 25.0000 ug/h | INTRAVENOUS | Status: DC

## 2016-07-27 NOTE — Progress Notes (Addendum)
Progress Note  Patient Name: [name] Date of Encounter: 07/27/2016  Primary Cardiologist: Nahser  Subjective   66 year old male w/ h/o HTN, DM type II and prior CVA. Also remote h/o hodgkin's lymphoma. Found down in bathroom after a fall. CPR initiated by spouse. Initial rhythm was VF on EMS arrival. CPR continued, shocked x1. Time to ROSC estimated at 7 minutes. Pt unresponsive on arrival w/ report of possible localization to noxious stim, cough and later decerebrate posturing. Placed in C collar, CT head negative, rushed to cardiac cath lab PCCM to admit. Cath neg with minimal CAD  ( LAD with 40% mid lesion) > hypothermia protocol initiated. EF 40-45% by Echo   Now rewarmed. Remains intubated. On fentanyl. Paralytics off. Desatted last night. Received lasix. Poor urine output. CVP 6   Will open eyes to voice. Not following commands SBP 160-180  Inpatient Medications    Scheduled Meds: . artificial tears  1 application Both Eyes K8A  . aspirin  81 mg Oral Daily  . atorvastatin  10 mg Per Tube Daily  . chlorhexidine gluconate (MEDLINE KIT)  15 mL Mouth Rinse BID  . Chlorhexidine Gluconate Cloth  6 each Topical Daily  . feeding supplement (PRO-STAT SUGAR FREE 64)  30 mL Per Tube BID  . feeding supplement (VITAL HIGH PROTEIN)  1,000 mL Per Tube Q24H  . heparin  5,000 Units Subcutaneous Q8H  . insulin aspart  2-6 Units Subcutaneous Q4H  . insulin glargine  10 Units Subcutaneous Q24H  . mouth rinse  15 mL Mouth Rinse 10 times per day  . pantoprazole (PROTONIX) IV  40 mg Intravenous QHS  . sodium chloride flush  10-40 mL Intracatheter Q12H   Continuous Infusions: . sodium chloride    . sodium chloride 10 mL/hr at 07/27/16 0000  . fentaNYL infusion INTRAVENOUS 200 mcg/hr (07/27/16 0935)  . midazolam (VERSED) infusion Stopped (07/27/16 0100)  . norepinephrine (LEVOPHED) Adult infusion 0 mcg/min (07/27/16 0211)  . propofol (DIPRIVAN) infusion     PRN Meds: sodium chloride,  acetaminophen (TYLENOL) oral liquid 160 mg/5 mL, fentaNYL, hydrALAZINE, ondansetron (ZOFRAN) IV, sodium chloride flush   Vital Signs    Vitals:   07/27/16 1100 07/27/16 1149 07/27/16 1200 07/27/16 1217  BP:  (!) 166/53    Pulse: (!) 121 (!) 122 (!) 119   Resp: 20 19 (!) 21   Temp:      TempSrc:      SpO2: (!) 88% 90% 91% 94%  Weight:      Height:        Intake/Output Summary (Last 24 hours) at 07/27/16 1219 Last data filed at 07/27/16 1000  Gross per 24 hour  Intake          1776.75 ml  Output              570 ml  Net          1206.75 ml   Filed Weights   07/24/2016 1400  Weight: 67.5 kg (148 lb 13 oz)    Telemetry    STach 100-120  - Personally Reviewed  ECG    NSR  - Personally Reviewed  Physical Exam   GEN: sedated on vent. Opens eyes to voice. Doesn't follow commans Neck: In c-collar Cardiac: RRR, no murmurs, rubs, or gallops.  Respiratory: Clear to auscultation bilaterally. Mechanical BS  GI: Soft, nontender, non-distended cooling pads in place Extremities : Cooled. No edema; clubbing Neuro:  Sedated on vent.  Labs    Chemistry Recent Labs Lab 07/23/2016 0902  07/26/16 1955 07/27/16 0001 07/27/16 0419  NA 139  < > 140 139 142  K 4.4  < > 4.4 4.5 4.8  CL 107  < > 108 107 110  CO2 22  < > 22 22 21*  GLUCOSE 345*  < > 186* 168* 103*  BUN 15  < > _0 CREATININE 1.26*  < > 0.73 0.86 1.01  CALCIUM 8.1*  < > 8.4* 8.4* 8.7*  PROT 6.2*  --   --   --  6.2*  ALBUMIN 2.4*  --   --   --  2.2*  AST 49*  --   --   --  36  ALT 19  --   --   --  24  ALKPHOS 108  --   --   --  99  BILITOT 0.9  --   --   --  0.7  GFRNONAA 57*  < > >60 >60 >60  GFRAA >60  < > >60 >60 >60  ANIONGAP 10  < > _1 < > = values in this interval not displayed.   Hematology Recent Labs Lab 07/04/2016 1018  07/12/2016 1443 07/26/16 0355 07/27/16 0419  WBC 5.8  --   --  5.0 15.7*  RBC 3.78*  --   --  3.73* 4.10*  HGB 8.9*  < > 9.2* 8.9* 9.8*  HCT 30.7*  < > 27.0*  30.0* 33.6*  MCV 81.2  --   --  80.4 82.0  MCH 23.5*  --   --  23.9* 23.9*  MCHC 29.0*  --   --  29.7* 29.2*  RDW 17.2*  --   --  16.9* 17.3*  PLT 355  --   --  358 476*  < > = values in this interval not displayed.  Cardiac Enzymes  Recent Labs Lab 07/05/2016 1018 07/18/2016 1529 07/28/2016 1943 07/26/16 0355  TROPONINI 0.19* 0.46* 0.54* 0.49*     Recent Labs Lab 07/27/2016 0913  TROPIPOC 0.04     BNPNo results for input(s): BNP, PROBNP in the last 168 hours.   DDimer No results for input(s): DDIMER in the last 168 hours.    Patient Profile     66 y.o. male with cardiorespiratory arrest .   Assessment & Plan    1. Cardiac/VF arrest:   - Cath with minimal CAD - EF 40-45% by echo - Troponins are minimally elevated and the curve is relatively flat - c/w CPR and defibrillation - not an ACS. - Rhythm stable on telemetry - Etiology of arrest remains uncertain. He had QT prolongation on initial ECG (QTC 537m but none since and ECGs in 2015 and 2017 were ok) - Brain MRI scheduled for today. Continue assessment for neuro recovery - If recovers will need cMRI and EP to see for possible ICD. CCM planning carotid u/s as well  2. DM2 -- On SSI and lantus  3. H/o CVA --Head CT on admit was non-acute.   4. Acute respiratory failure in setting of VF arrest --CCM managing --Severe COPD on chest CT.   5. Leukocytosis with left shift --CT head with sinus disease. --Will start zosyn   6. Hypertension and tachycardia - ?withdrawal versus agitation. Family denies excessive ETOH - Start low dose b-blocker. PRN hydralazine - May benefit from low-dose benzo  The patient is critically ill with multiple organ systems failure and requires high complexity decision  making for assessment and support, frequent evaluation and titration of therapies, application of advanced monitoring technologies and extensive interpretation of multiple databases.   Critical Care Time devoted to patient  care services described in this note is 35 Minutes.     Treasa School, MD  07/27/2016, 12:19 PM

## 2016-07-27 NOTE — Progress Notes (Signed)
Pt transported to MRI and back to room on 4N with no events to note. Back to previous vent settings. RN at bedside.

## 2016-07-27 NOTE — Progress Notes (Signed)
PULMONARY / CRITICAL CARE MEDICINE   Name: [name] MRN: [id number] DOB: [birth year]    ADMISSION DATE:  07/26/2016  REFERRING MD:  Tegeler   CHIEF COMPLAINT:  VF arrest  HISTORY OF PRESENT ILLNESS:  66 year old male w/ h/o HTN, DM type II and prior CVA. Also remote h/o hodgkin's lymphoma. Found down in bathroom after a fall. CPR initiated by spouse. Initial rhythm was VF on EMS arrival. CPR continued, shocked x1. Time to ROSC estimated at 7 minutes. Pt unresponsive on arrival w/ report of possible localization to noxious stim, cough and later decerebrate posturing. Placed in C collar, CT head negative, rushed to cardiac cath lab PCCM to admit. Cath neg with minimal CAD > hypothermia protocol initiated.   SUBJECTIVE:  RN reports after paralytics turned off, the patient was noted to have rigid episodes and desaturations.  All sedation had been turned off as well. Fentanyl restarted with improvement in symptoms.   VITAL SIGNS: BP (!) 120/55 (BP Location: Left Arm)   Pulse (!) 126   Temp 97 F (36.1 C) (Core (Comment))   Resp (!) 21   Ht 5\' 5"  (1.651 m)   Wt 148 lb 13 oz (67.5 kg)   SpO2 97%   BMI 24.76 kg/m   HEMODYNAMICS: CVP:  [2 mmHg-5 mmHg] 5 mmHg  VENTILATOR SETTINGS: Vent Mode: PRVC FiO2 (%):  [40 %-70 %] 70 % Set Rate:  [20 bmp] 20 bmp Vt Set:  [490 mL] 490 mL PEEP:  [5 cmH20-10 cmH20] 10 cmH20 Plateau Pressure:  [15 cmH20-18 cmH20] 16 cmH20  INTAKE / OUTPUT: I/O last 3 completed shifts: In: 3743.2 [I.V.:3178.2; Other:150; NG/GT:50; IV Piggyback:365] Out: [id number] [Urine:1555]  PHYSICAL EXAMINATION: General:  Acutely ill appearing male in NAD on vent HEENT: MM pink/moist, ETT, C-Collar in place Neuro: opens eyes to voice, no follow commands CV: s1s2 rrr, no m/r/g PULM: even/non-labored, lungs bilaterally clear [id number], non-tender, bsx4 active  Extremities: warm/dry, trace generalized edema  Skin: no rashes or lesions   LABS:  BMET  Recent Labs Lab  07/26/16 1955 07/27/16 0001 07/27/16 0419  NA 140 139 142  K 4.4 4.5 4.8  CL 108 107 110  CO2 22 22 21*  BUN 14 13 14   CREATININE 0.73 0.86 1.01  GLUCOSE 186* 168* 103*    Electrolytes  Recent Labs Lab 07/07/2016 0902  07/26/16 0355  07/26/16 1336  07/26/16 1955 07/27/16 0001 07/27/16 0419  CALCIUM 8.1*  < > 8.5*  < >  --   < > 8.4* 8.4* 8.7*  MG 1.6*  --  1.8  --  1.7  --   --   --   --   PHOS  --   --  4.1  --  3.5  --   --   --   --   < > = values in this interval not displayed.  CBC  Recent Labs Lab 07/14/2016 1018  07/04/2016 1443 07/26/16 0355 07/27/16 0419  WBC 5.8  --   --  5.0 15.7*  HGB 8.9*  < > 9.2* 8.9* 9.8*  HCT 30.7*  < > 27.0* 30.0* 33.6*  PLT 355  --   --  358 476*  < > = values in this interval not displayed.  Coag's  Recent Labs Lab 07/08/2016 0902 07/31/2016 1018 07/17/2016 1740  APTT  --  40* 49*  INR 1.25 1.20 1.22    Sepsis Markers  Recent Labs Lab 07/21/2016 0915 07/22/2016 1038  LATICACIDVEN 2.42*  0.8    ABG  Recent Labs Lab 07/26/16 0300 07/26/16 2309 07/27/16 0318  PHART 7.398 7.297* 7.250*  PCO2ART 34.9 45.6 49.8*  PO2ART 126* 60.0* 69.0*    Liver Enzymes  Recent Labs Lab 07/20/2016 0902 07/27/16 0419  AST 49* 36  ALT 19 24  ALKPHOS 108 99  BILITOT 0.9 0.7  ALBUMIN 2.4* 2.2*    Cardiac Enzymes  Recent Labs Lab 07/21/2016 1529 07/10/2016 1943 07/26/16 0355  TROPONINI 0.46* 0.54* 0.49*    Glucose  Recent Labs Lab 07/26/16 0854 07/26/16 1153 07/26/16 1558 07/26/16 2012 07/26/16 2356 07/27/16 0356  GLUCAP 162* 180* 205* 174* 161* 118*    Imaging Dg Chest Port 1 View  Result Date: 07/27/2016 CLINICAL DATA:  Acute respiratory failure EXAM: PORTABLE CHEST 1 VIEW COMPARISON:  07/13/2016 and prior exams FINDINGS: An endotracheal tube with tip 5 cm above the carina, NG tube within the stomach, right IJ central venous catheter with tip overlying the superior cavoatrial junction again noted. Slightly increased  bilateral interstitial opacities noted with increasing bilateral lower lung atelectasis/ airspace disease. Trace bilateral pleural effusions are noted. There is no evidence of pneumothorax. IMPRESSION: Slightly increasing bilateral interstitial opacities suggesting mild interstitial edema. Increasing bilateral lower lung atelectasis/ airspace disease and trace bilateral pleural effusions. Electronically Signed   By: Margarette Canada M.D.   On: 07/27/2016 07:26     STUDIES:  CT head 2/22: no bleed Left heart cath 2/22>>>cad min 40% Echo 2/22>>>LV wnl, R Vnormal eeg 2.22 no focus, global suppression  CULTURES:   ANTIBIOTICS:   SIGNIFICANT EVENTS: 2/22 admitted s/p VF arrest. Time to ROSC estimated at 7 minutes. Rushed to cardiac cath lab.   LINES/TUBES: OETT 2/22 >> R IJ 2/22 >> R rad aline 2/22 >>  DISCUSSION: 66 year old male admitted s/p VF arrest. Time to ROSC 7 minutes.  To cath lab with minimal disease.  Hypothermia protocol.  Rewarmed.     ASSESSMENT / PLAN:  PULMONARY A: Cardiopulmonary arrest w/ resultant acute respiratory failure  Hypoxia / Desaturations - noted 2/24 Mild interstitial edema / Pleural Effusions P:   PRVC 8 cc/kg  Wean PEEP / FiO2 for sats >92% Increase RR to 22 Follow up ABG pm 2/24  Trend PCXR Lasix 40 mg IV x1  CARDIOVASCULAR A:  VF arrest - unclear etiology LV fxn wnl RV wnl, unlikley PE Syncope associayed with VF HTN event 2/22 resolved after foley irrigated P:  Tele monitoring  Cardiology primary MAP Goal 65 with rewarm Will need carotid study > currently has C-Collar in place  RENAL A:   Hypomagnesemia  Mild lactic acidosis s/p cardiac arrest P:   Trend CBC  KVO IVF  Avoid free water with brain edema risk  Replace electrolytes as indicated  GASTROINTESTINAL A:   Protein Calorie Malnutrition   P:   NPO  Begin TF 2/24 PPI for SUP  HEMATOLOGIC A:   Anemia (h/o FESO4 def) P:  Trend CBC  Heparin SQ for DVT prophylaxis    INFECTIOUS A:   No evidence of infection  P:   Goal normothermia  Monitor fever curve / WBC  ENDOCRINE A:   DM w/ Hyperglycemia  P:   SSI  Lantus 10 units Q24  NEUROLOGIC A:   Acute encephalopathy s/p arrest - initial CT head negative for bleed. Some chronic grey matter disease  S/p fall Remote CVA R/o [place] as cause arrest P:   RASS goal:-1  Fentanyl gtt for pain / sedation  MRI Brain pending  C-collar until can be clinically cleared  Will need carotid US once able to clear neck  Frequent neuro exams  FAMILY  - Updates: No family at bedside on am rounds.    - Inter-disciplinary family meet or Palliative Care meeting due by: 2/29  CC Time: 73 minutes  Noe Gens, NP-C [facility] Pgr: [phone] or if no answer [phone] 07/27/2016, 8:34 AM

## 2016-07-27 NOTE — Progress Notes (Signed)
30 mls of Versed wasted in sink. Witnessed by Lenoria Farrier RN.

## 2016-07-27 NOTE — Progress Notes (Signed)
[facility] called and message left with secretary for MD regarding abnormal MRI.

## 2016-07-27 NOTE — Progress Notes (Signed)
ABG drawn on the following settings: PRVC 20/490/70%/+10

## 2016-07-27 NOTE — Progress Notes (Signed)
[place] [place] Progress Note Patient Name: [name] DOB: Oct 24, 1950 MRN: [id number]   Date of Service  07/27/2016  HPI/Events of Note  Patient on hypothermia protocol now off all sedation and paralytic.  Now with tachycardia, grimacing.    eICU Interventions  Re-initiate fentanyl sedation Sedation holiday in AM to allow for neuro assessment     Intervention Category Intermediate Interventions: Pain - evaluation and management  [name] 07/27/2016, 3:34 AM

## 2016-07-27 NOTE — Progress Notes (Signed)
Pharmacy Antibiotic Note  [name] is a 66 y.o. male admitted on 07/08/2016 with leukocytosis s/p cardiac arrest and sinusitis on CT head.  Pharmacy has been consulted for zosyn dosing.  Plan: Zosyn 3.375g IV q8h (4 hour infusion).  Height: 5\' 5"  (165.1 cm) Weight:  (72.0 with HI pads) IBW/kg (Calculated) : 61.5  Temp (24hrs), Avg:96.8 F (36 C), Min:93 F (33.9 C), Max:99.9 F (37.7 C)   Recent Labs Lab 07/14/2016 0902 07/24/2016 0915  07/07/2016 1018 07/26/2016 1038  07/26/16 0355  07/26/16 1149 07/26/16 1554 07/26/16 1955 07/27/16 0001 07/27/16 0419  WBC 5.8  --   --  5.8  --   --  5.0  --   --   --   --   --  15.7*  CREATININE 1.26*  --   < > 1.02  --   < > 0.77  < > 0.68 0.78 0.73 0.86 1.01  LATICACIDVEN  --  2.42*  --   --  0.8  --   --   --   --   --   --   --   --   < > = values in this interval not displayed.  Estimated Creatinine Clearance: 60.9 mL/min (by C-G formula based on SCr of 1.01 mg/dL).    Allergies  Allergen Reactions  . Codeine Phosphate Hives and Other (See Comments)    Itching and jittery  . Tomato Other (See Comments)    Heartburn     Antimicrobials this admission: 2/24 Zosyn >>   Dose adjustments this admission: NA  Microbiology results: NA  Thank you for allowing pharmacy to be a part of this patient's care.  Melburn Popper 07/27/2016 1:25 PM

## 2016-07-27 NOTE — Progress Notes (Signed)
[place] Progress Note Patient Name: KWAMI WINKELMAN DOB: 1950/08/05 MRN: [id number]   Date of Service  07/27/2016  HPI/Events of Note  MRI Brain 07/27/2016 - 1. Acute infarct centered in the junction of left parietal, occipital, and temporal lobes. Possible small focus of petechial hemorrhage. 2. Background of mild chronic microvascular ischemic changes and mild parenchymal volume loss of the brain. Small chronic lacunar infarcts in the right cerebellar hemisphere and right frontal periventricular white matter. Spoke with Dr. Nicole Kindred, the neurologist on call, concerning the findings. He states that there is no acute intervention indicated for the patient.   eICU Interventions  Continue current management.      Intervention Category Intermediate Interventions: Diagnostic test evaluation  Jamarie Mussa Cornelia Copa 07/27/2016, 8:11 PM

## 2016-07-27 NOTE — Progress Notes (Signed)
Nutrition Follow-up  DOCUMENTATION CODES:  Severe malnutrition in context of chronic illness  INTERVENTION:  Change TF to Vital AF 1.2 via OGT with Vital AF 1.2 at goal rate of 65 ml/h (1560 ml per day) and D/C Prostat  to provide 1872 kcals, 117 gm protein, 1265 ml free water daily.  NUTRITION DIAGNOSIS:  Malnutrition related to chronic illness as evidenced by severe depletion of muscle mass, moderate depletion of body fat, energy intake < 75% for > or equal to 1 month.  GOAL:  Patient will meet greater than or equal to 90% of their needs  MONITOR:  Weight trends, I & O's, Vent status  REASON FOR ASSESSMENT:  Consult Enteral/tube feeding initiation and management  ASSESSMENT:  Pt with PMH of COPD, CVA, Type II DM, Hodgkin's lymphoma s/p chemotherapy, HTN, hyperlipidemia, and documentation showing left ventricular dysfunction who presents with cardiac arrest. Pt was intubated in the field.  Abdomen: firm   No discernible edema.   Vital High protein infusing  Pt is currently intubated on ventilator support. MV: 14.3 L/min Temp (24hrs), Avg:96.8 F (36 C), Min:93 F (33.9 C), Max:99.9 F (37.7 C)  Labs reviewed; BG: Albumin 2.2, Wbc, 15.7, phos 4.8 Medications reviewed: Protonix,  Insulin, Versed, Levo, Fentanyl   Recent Labs Lab 07/26/16 0355  07/26/16 1336  07/26/16 1955 07/27/16 0001 07/27/16 0419 07/27/16 0845  NA 140  < >  --   < > 140 139 142  --   K 3.6  < >  --   < > 4.4 4.5 4.8  --   CL 106  < >  --   < > 108 107 110  --   CO2 20*  < >  --   < > 22 22 21*  --   BUN 16  < >  --   < > 14 13 14   --   CREATININE 0.77  < >  --   < > 0.73 0.86 1.01  --   CALCIUM 8.5*  < >  --   < > 8.4* 8.4* 8.7*  --   MG 1.8  --  1.7  --   --   --   --  2.2  PHOS 4.1  --  3.5  --   --   --   --  4.8*  GLUCOSE 183*  < >  --   < > 186* 168* 103*  --   < > = values in this interval not displayed.  Diet Order:  Diet NPO time specified  Skin: Abrasion to Left Leg  Last  BM:  2/21  Height:  Ht Readings from Last 1 Encounters:  07/30/2016 5\' 5"  (1.651 m)   Weight:  Wt Readings from Last 1 Encounters:  07/11/16 153 lb 12.8 oz (69.8 kg)   Wt Readings from Last 10 Encounters:  07/11/16 153 lb 12.8 oz (69.8 kg)  07/05/16 153 lb 9.6 oz (69.7 kg)  06/10/16 154 lb (69.9 kg)  05/06/16 147 lb 9.6 oz (67 kg)  05/03/16 148 lb (67.1 kg)  04/02/16 151 lb 1.6 oz (68.5 kg)  04/02/16 150 lb (68 kg)  03/15/16 147 lb (66.7 kg)  03/12/16 147 lb 12.8 oz (67 kg)  03/08/16 149 lb 11.2 oz (67.9 kg)  Admit weight: 148.81 lbs (67.64 kg)  Ideal Body Weight:  61.8 kg  BMI:  Body mass index is 24.76 kg/m.  Estimated Nutritional Needs:  Kcal:  1850 Protein:  101-115 (1.5-1.7 g/kg bw) Fluid:  Per MD  EDUCATION NEEDS:  Education needs no appropriate at this time  Burtis Junes RD, LDN, [facility] Pager: [phone] 07/27/2016 1:27 PM

## 2016-07-28 ENCOUNTER — Inpatient Hospital Stay (HOSPITAL_COMMUNITY): Payer: Medicare Other

## 2016-07-28 DIAGNOSIS — I639 Cerebral infarction, unspecified: Secondary | ICD-10-CM

## 2016-07-28 DIAGNOSIS — J9601 Acute respiratory failure with hypoxia: Secondary | ICD-10-CM

## 2016-07-28 DIAGNOSIS — G931 Anoxic brain damage, not elsewhere classified: Secondary | ICD-10-CM

## 2016-07-28 LAB — BLOOD GAS, ARTERIAL
ACID-BASE DEFICIT: 5.9 mmol/L — AB (ref 0.0–2.0)
BICARBONATE: 21 mmol/L (ref 20.0–28.0)
Drawn by: 252031
FIO2: 70
MECHVT: 490 mL
O2 SAT: 97.5 %
PATIENT TEMPERATURE: 98.6
PCO2 ART: 57.4 mmHg — AB (ref 32.0–48.0)
PEEP/CPAP: 10 cmH2O
PO2 ART: 119 mmHg — AB (ref 83.0–108.0)
RATE: 26 resp/min
pH, Arterial: 7.189 — CL (ref 7.350–7.450)

## 2016-07-28 LAB — COMPREHENSIVE METABOLIC PANEL
ALT: 22 U/L (ref 17–63)
AST: 41 U/L (ref 15–41)
Albumin: 1.9 g/dL — ABNORMAL LOW (ref 3.5–5.0)
Alkaline Phosphatase: 96 U/L (ref 38–126)
Anion gap: 12 (ref 5–15)
BILIRUBIN TOTAL: 1.4 mg/dL — AB (ref 0.3–1.2)
BUN: 34 mg/dL — AB (ref 6–20)
CHLORIDE: 107 mmol/L (ref 101–111)
CO2: 21 mmol/L — ABNORMAL LOW (ref 22–32)
Calcium: 8.5 mg/dL — ABNORMAL LOW (ref 8.9–10.3)
Creatinine, Ser: 2.21 mg/dL — ABNORMAL HIGH (ref 0.61–1.24)
GFR, EST AFRICAN AMERICAN: 33 mL/min — AB (ref >60)
GFR, EST NON AFRICAN AMERICAN: 29 mL/min — AB (ref >60)
Glucose, Bld: 185 mg/dL — ABNORMAL HIGH (ref 65–99)
POTASSIUM: 6.2 mmol/L — AB (ref 3.5–5.1)
Sodium: 140 mmol/L (ref 135–145)
TOTAL PROTEIN: 6.3 g/dL — AB (ref 6.5–8.1)

## 2016-07-28 LAB — POCT I-STAT 3, ART BLOOD GAS (G3+)
Acid-base deficit: 7 mmol/L — ABNORMAL HIGH (ref 0.0–2.0)
Bicarbonate: 21.1 mmol/L (ref 20.0–28.0)
O2 SAT: 96 %
PCO2 ART: 51.3 mmHg — AB (ref 32.0–48.0)
Patient temperature: 98
TCO2: 23 mmol/L (ref 0–100)
pH, Arterial: 7.219 — ABNORMAL LOW (ref 7.350–7.450)
pO2, Arterial: 94 mmHg (ref 83.0–108.0)

## 2016-07-28 LAB — CBC
HEMATOCRIT: 29.9 % — AB (ref 39.0–52.0)
Hemoglobin: 8.6 g/dL — ABNORMAL LOW (ref 13.0–17.0)
MCH: 23.8 pg — ABNORMAL LOW (ref 26.0–34.0)
MCHC: 28.8 g/dL — ABNORMAL LOW (ref 30.0–36.0)
MCV: 82.8 fL (ref 78.0–100.0)
PLATELETS: 501 10*3/uL — AB (ref 150–400)
RBC: 3.61 MIL/uL — AB (ref 4.22–5.81)
RDW: 17.2 % — AB (ref 11.5–15.5)
WBC: 21.4 10*3/uL — AB (ref 4.0–10.5)

## 2016-07-28 LAB — BASIC METABOLIC PANEL
ANION GAP: 14 (ref 5–15)
BUN: 36 mg/dL — ABNORMAL HIGH (ref 6–20)
CALCIUM: 8.5 mg/dL — AB (ref 8.9–10.3)
CO2: 20 mmol/L — ABNORMAL LOW (ref 22–32)
Chloride: 107 mmol/L (ref 101–111)
Creatinine, Ser: 2.3 mg/dL — ABNORMAL HIGH (ref 0.61–1.24)
GFR calc Af Amer: 32 mL/min — ABNORMAL LOW (ref >60)
GFR calc non Af Amer: 28 mL/min — ABNORMAL LOW (ref >60)
GLUCOSE: 148 mg/dL — AB (ref 65–99)
Potassium: 5.8 mmol/L — ABNORMAL HIGH (ref 3.5–5.1)
Sodium: 141 mmol/L (ref 135–145)

## 2016-07-28 LAB — MAGNESIUM
MAGNESIUM: 2.4 mg/dL (ref 1.7–2.4)
Magnesium: 2.5 mg/dL — ABNORMAL HIGH (ref 1.7–2.4)

## 2016-07-28 LAB — PHOSPHORUS
PHOSPHORUS: 8.3 mg/dL — AB (ref 2.5–4.6)
Phosphorus: 7.5 mg/dL — ABNORMAL HIGH (ref 2.5–4.6)

## 2016-07-28 LAB — GLUCOSE, CAPILLARY
GLUCOSE-CAPILLARY: 127 mg/dL — AB (ref 65–99)
GLUCOSE-CAPILLARY: 171 mg/dL — AB (ref 65–99)
Glucose-Capillary: 157 mg/dL — ABNORMAL HIGH (ref 65–99)
Glucose-Capillary: 93 mg/dL (ref 65–99)
Glucose-Capillary: 124 mg/dL — ABNORMAL HIGH (ref 65–99)

## 2016-07-28 MED ORDER — SODIUM CHLORIDE 0.9 % IV SOLN
INTRAVENOUS | Status: DC
  Administered 2016-08-01 – 2016-08-02 (×2): via INTRAVENOUS

## 2016-07-28 NOTE — Progress Notes (Signed)
PULMONARY / CRITICAL CARE MEDICINE   Name: [name] MRN: [id number] DOB: 12/07/1950    ADMISSION DATE:  07/31/2016  REFERRING MD:  Tegeler   CHIEF COMPLAINT:  VF arrest  HISTORY OF PRESENT ILLNESS:  66 year old male w/ h/o HTN, DM type II and prior CVA. Also remote h/o hodgkin's lymphoma. Found down in bathroom after a fall. CPR initiated by spouse. Initial rhythm was VF on EMS arrival. CPR continued, shocked x1. Time to ROSC estimated at 7 minutes. Pt unresponsive on arrival w/ report of possible localization to noxious stim, cough and later decerebrate posturing. Placed in C collar, CT head negative, rushed to cardiac cath lab PCCM to admit. Cath neg with minimal CAD > hypothermia protocol initiated.   SUBJECTIVE:   Mixed resp/metabolic acidosis overnight, RR increased on vent.  MRI results reviewed.   VITAL SIGNS: BP (!) 108/40   Pulse 96   Temp 97.4 F (36.3 C) (Core (Comment))   Resp (!) 35   Ht 5\' 5"  (1.651 m)   Wt 156 lb 4.9 oz (70.9 kg)   SpO2 99%   BMI 26.01 kg/m   HEMODYNAMICS: CVP:  [9 mmHg-12 mmHg] 12 mmHg  VENTILATOR SETTINGS: Vent Mode: PRVC FiO2 (%):  [60 %-100 %] 60 % Set Rate:  [22 bmp-35 bmp] 35 bmp Vt Set:  [490 mL-530 mL] 530 mL PEEP:  [10 cmH20] 10 cmH20 Plateau Pressure:  [21 cmH20-28 cmH20] 28 cmH20  INTAKE / OUTPUT: I/O last 3 completed shifts: In: 2646.9 [I.V.:1571.9; NG/GT:925; IV Piggyback:150] Out: 685 [Urine:655; Emesis/NG output:30]  PHYSICAL EXAMINATION: General:  Ill appearing adult male in NAD on vent  HEENT: MM pink/moist, ETT  Neuro:  Sedate CV: s1s2 rrr, no m/r/g PULM: even/non-labored, lungs bilaterally coarse [id number], non-tender, bsx4 active  Extremities: warm/dry, trace generalized edema  Skin: no rashes or lesions    LABS:  BMET  Recent Labs Lab 07/27/16 0419 07/28/16 0432 07/28/16 0625  NA 142 140 141  K 4.8 6.2* 5.8*  CL 110 107 107  CO2 21* 21* 20*  BUN 14 34* 36*  CREATININE 1.01 2.21* 2.30*  GLUCOSE  103* 185* 148*    Electrolytes  Recent Labs Lab 07/27/16 0419 07/27/16 0845 07/27/16 1827 07/28/16 0432 07/28/16 0625  CALCIUM 8.7*  --   --  8.5* 8.5*  MG  --  2.2 2.1 2.4  --   PHOS  --  4.8* 6.3* 7.5*  --     CBC  Recent Labs Lab 07/26/16 0355 07/27/16 0419 07/28/16 0432  WBC 5.0 15.7* 21.4*  HGB 8.9* 9.8* 8.6*  HCT 30.0* 33.6* 29.9*  PLT 358 476* 501*    Coag's  Recent Labs Lab 07/18/2016 0902 07/13/2016 1018 07/20/2016 1740  APTT  --  40* 49*  INR 1.25 1.20 1.22    Sepsis Markers  Recent Labs Lab 07/14/2016 0915 07/08/2016 1038  LATICACIDVEN 2.42* 0.8    ABG  Recent Labs Lab 07/27/16 1556 07/28/16 0350 07/28/16 0629  PHART 7.217* 7.189* 7.219*  PCO2ART 51.4* 57.4* 51.3*  PO2ART 85.0 119* 94.0    Liver Enzymes  Recent Labs Lab 07/10/2016 0902 07/27/16 0419 07/28/16 0432  AST 49* 36 41  ALT 19 24 22   ALKPHOS 108 99 96  BILITOT 0.9 0.7 1.4*  ALBUMIN 2.4* 2.2* 1.9*    Cardiac Enzymes  Recent Labs Lab 07/04/2016 1529 07/04/2016 1943 07/26/16 0355  TROPONINI 0.46* 0.54* 0.49*    Glucose  Recent Labs Lab 07/27/16 1224 07/27/16 1821 07/27/16 2034  07/28/16 0007 07/28/16 0425 07/28/16 0818  GLUCAP 99 83 113* 127* 157* 78    Imaging Mr Brain Wo Contrast  Result Date: 07/27/2016 CLINICAL DATA:  66 y/o  M; cardiac arrest with hypothermia protocol. EXAM: MRI HEAD WITHOUT CONTRAST TECHNIQUE: Multiplanar, multiecho pulse sequences of the brain and surrounding structures were obtained without intravenous contrast. COMPARISON:  07/06/2016 CT head FINDINGS: Brain: Acute infarct centered in junction of left parietal, occipital, and temporal lobes spanning a region of 18 x 42 x 53 mm (AP x ML x CC series 5, image 27 and series 7, image 6). Mild associated T2 FLAIR hyperintense signal abnormality and local mass effect. Small focus of susceptibility (series 9, image 13) may represent petechial hemorrhage. Small chronic lacunar infarct in the right  cerebellar hemisphere and small right frontal periventricular white matter chronic infarct. Punctate focus of susceptibility hypointensity in subcortical frontal white matter bilaterally probably consistent with hemosiderin deposition of old microhemorrhage. Scattered foci of T2 FLAIR hyperintense signal abnormality in subcortical and periventricular white matter compatible with mild chronic microvascular ischemic changes. Mild brain parenchymal volume loss. Vascular: Normal flow voids. Skull and upper cervical spine: Advanced degenerative changes of the upper cervical spine partially visualized. Bone marrow signal is normal. Sinuses/Orbits: Mild diffuse paranasal sinus mucosal thickening and bilateral mastoid effusions probably related intubation. Bilateral intra-ocular lens replacement. Other: None. IMPRESSION: 1. Acute infarct centered in the junction of left parietal, occipital, and temporal lobes. Possible small focus of petechial hemorrhage. 2. Background of mild chronic microvascular ischemic changes and mild parenchymal volume loss of the brain. Small chronic lacunar infarcts in the right cerebellar hemisphere and right frontal periventricular white matter. These results will be called to the ordering clinician or representative by the Radiologist Assistant, and communication documented in the PACS or zVision Dashboard. Electronically Signed   By: Kristine Garbe M.D.   On: 07/27/2016 19:29     STUDIES:  CT head 2/22 >>no bleed Left heart cath 2/22 >> cad min 40% Echo 2/22 >> LV wnl, R Vnormal EEG 2/22 no focus, global suppression MRI 2/24 >> acute infarct centered in the junction of left parietal, occipital & temporal lobes, background mild chronic microvascular ischemic changes, chronic lacunar infarcts in the right cerebellar hemisphere & right frontal periventricular white matter  CULTURES:   ANTIBIOTICS:   SIGNIFICANT EVENTS: 2/22 admitted s/p VF arrest. Time to ROSC estimated  at 7 minutes. Rushed to cardiac cath lab.  2/24 MRI concerning for acute infarct > neuro recommended no acute interventions   LINES/TUBES: OETT 2/22 >> R IJ 2/22 >> R rad aline 2/22 >>  DISCUSSION: 66 year old male admitted s/p VF arrest. Time to ROSC 7 minutes.  To cath lab with minimal disease.  Hypothermia protocol.  Rewarmed.  MRI with acute infarct in    ASSESSMENT / PLAN:  PULMONARY A: Cardiopulmonary arrest w/ resultant acute respiratory failure  Hypoxia / Desaturations - noted 2/24 Mild interstitial edema / Pleural Effusions P:   Continue vent support  PRVC 8 cc/kg, rate 35 Follow ABG > repeat pm 2/25 Trend PCXR  CARDIOVASCULAR A:  VF arrest - unclear etiology LV fxn wnl RV wnl, unlikley PE Syncope associayed with VF HTN event 2/22 resolved after foley irrigated P:  Tele monitoring  NO CPR in event of arrest  Will need carotid study once C-Collar removed DNR, continue current level of support for now  RENAL A:   Hypomagnesemia  Mild lactic acidosis s/p cardiac arrest Hyperkalemia - suspect in setting  of acidosis P:   Trend BMP  NS @ 50 ml/hr  Replace electrolytes as indicated  GASTROINTESTINAL A:   Protein Calorie Malnutrition   P:   NPO  TF per nutrition  PPI for SUP   HEMATOLOGIC A:   Anemia (h/o FESO4 def) P:  Trend CBC  Heparin SQ   INFECTIOUS A:   No evidence of infection  P:   Normothermia goal  Monitor fever curve / wbc  ENDOCRINE A:   DM w/ Hyperglycemia  P:   SSI  Lantus 10 units  NEUROLOGIC A:   Acute encephalopathy s/p arrest - initial CT head negative for bleed. Some chronic grey matter disease  S/p fall CVA - left temporal, parietal and occipital involvement Remote CVA R/o [place] as cause arrest P:   RASS goal: 0 to -1  Fentanyl gtt for pain / sedation  C-collar until can be cleared clinically  Frequent neuro exams Neuro consult, appreciate input   FAMILY  - Updates:  Wife updated in conference room  on MRI findings. She reports he would not want to "live" on machines and if he could not have a reasonable recovery from CVA, would not want to live in a facility / require feeding tubes etc.  She also would not want to prolonged suffering or perform ACLS in the event of recurrent arrest.   - Inter-disciplinary family meet or Palliative Care meeting due by: completed 2/25  CC Time: 83 minutes    Noe Gens, NP-C [facility] Pgr: [phone] or if no answer [phone] 07/28/2016, 10:13 AM

## 2016-07-28 NOTE — Consult Note (Addendum)
Neurology Consult Note  Reason for Consultation: Prognosis following cardiac arrest  Requesting provider: Marshell Garfinkel, MD  CC: Unable to obtain as the patient is encephalopathic and unable to provide  HPI: This is a 65 year old man who presented to the [facility] emergency department on 07/24/2016 following an out-of-hospital cardiac arrest. History is obtained from review of the patient's medical record as he is unable to provide.  According to notes, the patient's family heard him fall in the bathroom on the morning of presentation. They called 911 and EMS arrived and the patient unresponsive. Per primary, no bystander CPR was administered prior to the arrival of first responders. Fire department arrived first initiated CPR. AED was applied and he was found to be in ventricular fibrillation. He was shocked once with return of spontaneous circulation. Estimated time to return of spontaneous circulation was 7 minutes. He was intubated in the field. He was transported to the emergency department where he remained unresponsive. GCS was 3. Therapeutic hypothermia was initiated. Cardiology was consulted and he was taken to the cardiac cath lab for further evaluation. He was rewarmed on 07/26/16. MRI scan of the brain was obtained and showed an acute ischemic infarction involving the junction of the left temporal, parietal, and occipital lobes. He has remained poorly responsive and neurology is now consulted for further recommendations and for prognosis following cardiac arrest.  Medications reviewed; pertinent meds: Fentanyl drip 400 mcg/hr Midazolam drip stopped 07/27/16 at 0100 Norepinephrine drip OFF   PMH:  Past Medical History:  Diagnosis Date  . Anemia   . Arthritis    joints,   . Cataract    removed both eyes  . COPD (chronic obstructive pulmonary disease) (HCC)    mild  . CVA (cerebral vascular accident) ([place]) 2005   hx of right brain subcortical stroke  . Diabetes mellitus type II    . H/O hiatal hernia   . Hodgkin's lymphoma ([place]) 2000   stage IV B status post ABCD chemotherapy -2000  . Hyperlipidemia   . Hypertension    Dr. Vidal Schwalbe cardiology  . 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 Pneumonia Feb 2008  . Radiculopathy    S1  . Sinus congestion 06/19/11   pt has had sinus "infection" and just finished a z-pak  dr geiger aware  . Sleep apnea    npo cpap    PSH:  Past Surgical History:  Procedure Laterality Date  . 2-d echocardiogram  April 2002   ejection fraction 55 to 65%  . CATARACT EXTRACTION W/PHACO  06/19/2011   Procedure: CATARACT EXTRACTION PHACO AND INTRAOCULAR LENS PLACEMENT (IOC);  Surgeon: Adonis Brook, MD;  Location: [facility];  Service: Ophthalmology;  Laterality: Right;  [place] FMA50BM +18.00 D Right  . COLONOSCOPY  2003  . LEFT HEART CATH AND CORONARY ANGIOGRAPHY N/A 07/18/2016   Procedure: Left Heart Cath and Coronary Angiography;  Surgeon: Lorretta Harp, MD;  Location: [facility];  Service: Cardiovascular;  Laterality: N/A;  . lymph node biospy  2000    Family history: Family History  Problem Relation Age of Onset  . Diabetes Mother   . Heart disease Mother   . Heart failure Father   . Cancer Sister   . Lung cancer Sister   . Cancer Brother   . Lung cancer Brother   . Anesthesia problems Neg Hx   . Colon cancer Neg Hx   . Colon polyps Neg Hx   .  Esophageal cancer Neg Hx     Social history:  Social History   Social History  . Marital status: Married    Spouse name: N/A  . Number of children: 2  . Years of education: N/A   Occupational History  . 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 Smoking status: Former Smoker    Years: 30.00    Quit date: 06/03/2004  . Smokeless tobacco: Never Used  . Alcohol use No     Comment: rarely  . Drug use: No  . Sexual activity: Not on file   Other Topics  Concern  . Not on file   Social History Narrative   Lives with his wife.   Their children are adults and live in [place].      Current inpatient meds: Medications reviewed and reconciled.  Current Facility-Administered Medications  Medication Dose Route Frequency Provider Last Rate Last Dose  . 0.9 %  sodium chloride infusion   Intravenous Continuous PRN Gwenyth Allegra Tegeler, MD   1,000 mL at 07/16/2016 0908  . 0.9 %  sodium chloride infusion   Intravenous Continuous Donita Brooks, NP      . acetaminophen (TYLENOL) solution 650 mg  650 mg Per Tube Q6H PRN Colbert Coyer, MD   650 mg at 07/27/16 0219  . aspirin chewable tablet 81 mg  81 mg Oral Daily Lorretta Harp, MD   81 mg at 07/28/16 3825  . atorvastatin (LIPITOR) tablet 10 mg  10 mg Per Tube Daily Erick Colace, NP   10 mg at 07/28/16 0539  . chlorhexidine gluconate (MEDLINE KIT) (PERIDEX) 0.12 % solution 15 mL  15 mL Mouth Rinse BID Lorretta Harp, MD   15 mL at 07/28/16 0744  . Chlorhexidine Gluconate Cloth 2 % PADS 6 each  6 each Topical Daily [name], MD   6 each at 07/28/16 1000  . feeding supplement (VITAL AF 1.2 CAL) liquid 1,000 mL  1,000 mL Per Tube Continuous Lorretta Harp, MD 65 mL/hr at 07/28/16 0400 1,000 mL at 07/28/16 0400  . fentaNYL (SUBLIMAZE) 2,500 mcg in sodium chloride 0.9 % 250 mL (10 mcg/mL) infusion  25-400 mcg/hr Intravenous Continuous Colbert Coyer, MD 40 mL/hr at 07/28/16 0615 400 mcg/hr at 07/28/16 0615  . fentaNYL (SUBLIMAZE) bolus via infusion 25 mcg  25 mcg Intravenous Q1H PRN Colbert Coyer, MD      . heparin injection 5,000 Units  5,000 Units Subcutaneous Q8H Erick Colace, NP   5,000 Units at 07/28/16 0600  . hydrALAZINE (APRESOLINE) injection 10 mg  10 mg Intravenous Q4H PRN Jolaine Artist, MD      . insulin aspart (novoLOG) injection 2-6 Units  2-6 Units Subcutaneous Q4H Lorretta Harp, MD   4 Units at 07/28/16 0435  . insulin glargine (LANTUS)  injection 10 Units  10 Units Subcutaneous Q24H Lorretta Harp, MD   10 Units at 07/27/16 0935  . MEDLINE mouth rinse  15 mL Mouth Rinse 10 times per day Lorretta Harp, MD   15 mL at 07/28/16 0959  . metoprolol (LOPRESSOR) injection 5 mg  5 mg Intravenous Q12H Jolaine Artist, MD   5 mg at 07/27/16 2222  . midazolam (VERSED) 50 mg in sodium chloride 0.9 % 50 mL (1 mg/mL) infusion  1-10 mg/hr Intravenous Continuous Lorretta Harp, MD   Stopped at 07/27/16 0100  .  norepinephrine (LEVOPHED) 4 mg in dextrose 5 % 250 mL (0.016 mg/mL) infusion  0-50 mcg/min Intravenous Titrated Simonne Martinet, NP 0 mL/hr at 07/27/16 0211 0 mcg/min at 07/27/16 0211  . ondansetron (ZOFRAN) injection 4 mg  4 mg Intravenous Q6H PRN Runell Gess, MD      . pantoprazole (PROTONIX) injection 40 mg  40 mg Intravenous QHS Simonne Martinet, NP   40 mg at 07/27/16 2222  . piperacillin-tazobactam (ZOSYN) IVPB 3.375 g  3.375 g Intravenous Q8H Sherron Monday, RPH   3.375 g at 07/28/16 1992  . propofol (DIPRIVAN) 1000 MG/100ML infusion  25-80 mcg/kg/min Intravenous Continuous Simonne Martinet, NP      . sodium chloride flush (NS) 0.9 % injection 10-40 mL  10-40 mL Intracatheter Q12H Nelda Bucks, MD   10 mL at 07/28/16 1003  . sodium chloride flush (NS) 0.9 % injection 10-40 mL  10-40 mL Intracatheter PRN Nelda Bucks, MD        Allergies: Allergies  Allergen Reactions  . Codeine Phosphate Hives and Other (See Comments)    Itching and jittery  . Tomato Other (See Comments)    Heartburn     ROS: As per HPI. A full 14-point review of systems could not be obtained as the patient is intubated and unable to provide.   PE:  BP (!) 119/48   Pulse 99   Temp 97.4 F (36.3 C) (Core (Comment))   Resp (!) 35   Ht 5\' 5"  (1.651 m)   Wt 70.9 kg (156 lb 4.9 oz)   SpO2 97%   BMI 26.01 kg/m   General: WDWN man lying in ICU bed. He is intubated and sedated. He opens his eyes to voice and blinks spontaneously but  does not fix or track. He does not follow any commands.  HEENT: Normocephalic. Cervical collar in place. EGT, OGT in place. Sclerae anicteric. No conjunctival injection.  CV: Regular, no murmur. Carotid pulses full and symmetric, no bruits. Distal pulses 2+ and symmetric.  Lungs: CTAB. Ventilated.   Abdomen: Soft. Cooling pads remain in place. Bowel sounds hypoactive.  Extremities: No C/C/E. Cooling pads remain in place.  Neuro:  CN: Pupils are equal and round. They are symmetrically reactive from 3-->2 mm. He does not blink to visual threat but does blink spontaneously. Eyes are conjugate without forced deviation. No nystagmus. Corneals are brisk bilaterally. His face appears grossly symmetric but is partly obscured by tubes and tape. He does not gag with ETT manipulation. The remainder of his cranial nerves cannot be assessed as he is not able to participate with the exam.  Motor: Normal bulk. Tone is increased throughout. The LUE is held flexed across his body. No spontaneous movement is noted. No tremor or other abnormal movements.  Sensation: There is no response to peripheral or central pain.  DTRs: 3+, symmetric. Toes upgoing on the L, mute on the R. Hoffman's present bilaterally.   Coordination and gait: These cannot be assessed as he is not able to participate with the exam.  Labs:  Lab Results  Component Value Date   WBC 21.4 (H) 07/28/2016   HGB 8.6 (L) 07/28/2016   HCT 29.9 (L) 07/28/2016   PLT 501 (H) 07/28/2016   GLUCOSE 148 (H) 07/28/2016   CHOL 108 08/21/2015   TRIG 111.0 08/21/2015   HDL 31.10 (L) 08/21/2015   LDLDIRECT 56.5 11/01/2010   LDLCALC 55 08/21/2015   ALT 22 07/28/2016   AST 41 07/28/2016  NA 141 07/28/2016   K 5.8 (H) 07/28/2016   CL 107 07/28/2016   CREATININE 2.30 (H) 07/28/2016   BUN 36 (H) 07/28/2016   CO2 20 (L) 07/28/2016   TSH 1.03 12/26/2015   PSA 2.22 12/26/2015   INR 1.22 07/09/2016   HGBA1C 6.5 07/05/2016   MICROALBUR 19.9 (H) 08/21/2015     Imaging:  I have personally and independently reviewed the MRI scan of the brain without contrast from 07/27/16. This shows an area of restricted diffusion involving the left temporal, parietal, occipital junction consistent with acute ischemia. No significant mass effect is associated with this. There appears to be some mild degree of petechial hemorrhage within this infarct. A mild burden of chronic small vessel ischemic disease is present. There appears to be a focal chronic infarct involving the right frontal white matter with an additional old lacunae in the right cerebellar hemisphere. Mild diffuse generalized volume loss is appreciated.  I have personally and independently reviewed CT scan of the head without contrast from 07/28/2016. This shows mild diffuse generalized atrophy with mild chronic small vessel ischemic disease. No obvious acute abnormality is appreciated.  Other diagnostic studies:  EEG 07/14/2016 showed global suppression of the background with absent reactivity. However, this was performed with the patient sedated, paralyzed, and hypothermic.   TTE 07/15/2016: Mild LVH; EF 40-45%; diffuse hypokinesis; grade 1 diastolic dysfunction; mild MR; severe dilation of the R atrium; No PFO/ASD  Carotid Dopplers pending  Assessment and Plan:  1. Anoxic brain injury: Acute, due to OOH VF arrest. He is day 3 s/p arrest, day 2 s/p rewarming. Treatment is supportive as noted below.   2. Anoxic encephalopathy: This is acute, due to anoxic brain injury as above. The current examination shows intact brainstem reflexes but no response to pain and no higher level cortical function. Continue with supportive care and therapeutic hypothermia. Avoid hypoxemia and hypotension for even brief intervals as these are associated with worse neurologic outcomes. Fever and hyperglycemia must be aggressively treated for the same reason. We will continue to follow the exam to aid with neurologic prognosis. Will  repeat EEG in AM.   3. Acute Ischemic Stroke: This is an acute stroke involving the L MCA territory, specifically the L temporo-parieto-occipital junction. It is most likely embolic in etiology. Known risk factors for cerebrovascular disease in this patient include DM, HTN, hyperlipidemia, prior stroke, and age. The stroke could have resulted from intracardiac thrombus ejected with ROSC. It could potentially be a complication of cardiac cath as well. TTE did not show an embolic source. Carotid Dopplers are pending.  LDL is at goal. Hgb a1c is reasonable at 6.5. Continue aspirin and statin. void fever and hyperglycemia as these can extend the infarct. Avoid hypotonic IVF to minimize exacerbation of post-stroke edema. Initiate rehab services. DVT prophylaxis as needed.   While his stroke could certainly contribute to his encephalopathy, I would not expect it to be a major factor in his ability to regain consciousness if he is going to do so. It would also not be expected to limit his ability to significantly limit his ability to function independently in and of itself. Generally with strokes in this location, expected deficits would include possible receptive language deficits, attentional deficits, and possibly Gerstmann's syndrome. His stroke should not be the driving factor in any decisions made about the level of care moving forward.   No family was present at the time of my visit.   Thank you for this consult.  Neurology will continue to follow. Please call with any urgent questions or concerns.   This patient is critically ill and at significant risk of neurological worsening, death and care requires constant monitoring of vital signs, hemodynamics,respiratory and cardiac monitoring, neurological assessment, discussion with family, other specialists and medical decision making of high complexity. A total of 60 minutes of critical care time was spent on this case.

## 2016-07-28 NOTE — Progress Notes (Signed)
[facility] Progress Note Patient Name: [name] DOB: 08/14/1950 MRN: [id number]   Date of Service  07/28/2016  HPI/Events of Note  Mixed resp and metabolic acidois with pH [id number]  eICU Interventions  Plan: Increase in RR and TV on vent Recheck ABG in 2 hours     Intervention Category Major Interventions: Acid-Base disturbance - evaluation and management  Anessia Oakland 07/28/2016, 4:20 AM

## 2016-07-28 NOTE — Progress Notes (Addendum)
Progress Note  Patient Name: [name] Date of Encounter: 07/28/2016  Primary Cardiologist: Nahser  Subjective   66 year old male w/ h/o HTN, DM type II and prior CVA. Also remote h/o hodgkin's lymphoma. Found down in bathroom after a fall. CPR initiated by spouse. Initial rhythm was VF on EMS arrival. CPR continued, shocked x1. Time to ROSC estimated at 7 minutes. Pt unresponsive on arrival w/ report of possible localization to noxious stim, cough and later decerebrate posturing. Placed in C collar, CT head negative, rushed to cardiac cath lab PCCM to admit. Cath neg with minimal CAD  ( LAD with 40% mid lesion) > hypothermia protocol initiated. EF 40-45% by Echo   Now rewarmed. Remains intubated. On fentanyl.  Will open eyes to voice. Not following commands.  CVP 9-10. SBPs ok 110 range  Brain MRI last night with acute infarct (see below). D/w Neurology and no intervention warranted.    Acute infarct centered in junction of left parietal, occipital, and temporal lobes spanning a region of 18 x 42 x 53 mm (AP x ML x CC series 5, image 27 and series 7, image 6). Mild associated T2 FLAIR hyperintense signal abnormality and local mass effect. Small focus of susceptibility (series 9, image 13) may represent petechial hemorrhage.  Inpatient Medications    Scheduled Meds: . aspirin  81 mg Oral Daily  . atorvastatin  10 mg Per Tube Daily  . chlorhexidine gluconate (MEDLINE KIT)  15 mL Mouth Rinse BID  . Chlorhexidine Gluconate Cloth  6 each Topical Daily  . heparin  5,000 Units Subcutaneous Q8H  . insulin aspart  2-6 Units Subcutaneous Q4H  . insulin glargine  10 Units Subcutaneous Q24H  . mouth rinse  15 mL Mouth Rinse 10 times per day  . metoprolol  5 mg Intravenous Q12H  . pantoprazole (PROTONIX) IV  40 mg Intravenous QHS  . piperacillin-tazobactam (ZOSYN)  IV  3.375 g Intravenous Q8H  . sodium chloride flush  10-40 mL Intracatheter Q12H   Continuous Infusions: . sodium  chloride    . sodium chloride 50 mL/hr (07/28/16 1215)  . feeding supplement (VITAL AF 1.2 CAL) 1,000 mL (07/28/16 1242)  . fentaNYL infusion INTRAVENOUS 400 mcg/hr (07/28/16 1241)  . midazolam (VERSED) infusion Stopped (07/27/16 0100)  . norepinephrine (LEVOPHED) Adult infusion 0 mcg/min (07/27/16 0211)  . propofol (DIPRIVAN) infusion     PRN Meds: sodium chloride, acetaminophen (TYLENOL) oral liquid 160 mg/5 mL, fentaNYL, hydrALAZINE, ondansetron (ZOFRAN) IV, sodium chloride flush   Vital Signs    Vitals:   07/28/16 1100 07/28/16 1156 07/28/16 1200 07/28/16 1300  BP:  (!) 119/48    Pulse: 97 99 (!) 105 100  Resp: (!) 35 (!) 35 (!) 35 (!) 35  Temp:      TempSrc:      SpO2: 98% 97% 95% 96%  Weight:      Height:        Intake/Output Summary (Last 24 hours) at 07/28/16 1406 Last data filed at 07/28/16 1300  Gross per 24 hour  Intake             2285 ml  Output              340 ml  Net             1945 ml   Filed Weights   07/27/16 1149 07/28/16 0421  Weight: 71.5 kg (157 lb 10.1 oz) 70.9 kg (156 lb 4.9 oz)  Telemetry    Sinus rhythm/sinust tach 95-110. No VT - Personally Reviewed   Physical Exam   GEN: sedated on vent. Opens eyes to voice. Doesn't follow commands Neck: In c-collar Cardiac: RRR, no murmurs, rubs, or gallops.  Respiratory: Clear to auscultation bilaterally. Mechanical BS  GI: Soft, nontender, non-distended  Extremities : Cooled. No edema; clubbing Neuro:  Sedated on vent.    Labs    Chemistry Recent Labs Lab 07/28/2016 0902  07/27/16 0419 07/28/16 0432 07/28/16 0625  NA 139  < > 142 140 141  K 4.4  < > 4.8 6.2* 5.8*  CL 107  < > 110 107 107  CO2 22  < > 21* 21* 20*  GLUCOSE 345*  < > 103* 185* 148*  BUN 15  < > 14 34* 36*  CREATININE 1.26*  < > 1.01 2.21* 2.30*  CALCIUM 8.1*  < > 8.7* 8.5* 8.5*  PROT 6.2*  --  6.2* 6.3*  --   ALBUMIN 2.4*  --  2.2* 1.9*  --   AST 49*  --  36 41  --   ALT 19  --  24 22  --   ALKPHOS 108  --  99 96   --   BILITOT 0.9  --  0.7 1.4*  --   GFRNONAA 57*  < > >60 29* 28*  GFRAA >60  < > >60 33* 32*  ANIONGAP 10  < > '11 12 14  '$ < > = values in this interval not displayed.   Hematology  Recent Labs Lab 07/26/16 0355 07/27/16 0419 07/28/16 0432  WBC 5.0 15.7* 21.4*  RBC 3.73* 4.10* 3.61*  HGB 8.9* 9.8* 8.6*  HCT 30.0* 33.6* 29.9*  MCV 80.4 82.0 82.8  MCH 23.9* 23.9* 23.8*  MCHC 29.7* 29.2* 28.8*  RDW 16.9* 17.3* 17.2*  PLT 358 476* 501*    Cardiac Enzymes  Recent Labs Lab 07/19/2016 1018 07/18/2016 1529 07/27/2016 1943 07/26/16 0355  TROPONINI 0.19* 0.46* 0.54* 0.49*     Recent Labs Lab 07/21/2016 0913  TROPIPOC 0.04     BNPNo results for input(s): BNP, PROBNP in the last 168 hours.   DDimer No results for input(s): DDIMER in the last 168 hours.    Patient Profile     66 y.o. male with cardiorespiratory arrest .   Assessment & Plan    1. Cardiac/VF arrest:   - Cath with minimal CAD - EF 40-45% by echo - Troponins are minimally elevated and the curve is relatively flat - c/w CPR and defibrillation - not an ACS. - Rhythm stable on telemetry today.  - Etiology of arrest remains uncertain. He had QT prolongation on initial ECG (QTC 513m but none since and ECGs in 2015 and 2017 were ok) - Brain MRI shows acute infarct with small amount of mass effect. Exam suggestive of anoxic injury. Will likely need EEG - If recovers will need cMRI and EP to see for possible ICD but doubt this will be the case unfortunately - Remains hemodynamically stable off pressors  2. DM2 -- On SSI and lantus  3. H/o CVA --Head CT on admit was non-acute.  - Brain MRI shows   4. Acute respiratory failure in setting of VF arrest --CCM managing --Severe COPD on chest CT.   5. Leukocytosis with left shift --CT head with sinus disease on zosyn  --On  zosyn   6. CKD  --stable  Overall prognosis very poor in setting of acute CVA. I did speak  with family yesterday regarding possibility  of severe anoxic injury. They were not present at bedside today.  Appreciate CCM's assistance.   Critical Care Time devoted to patient care services described in this note is 35 Minutes.     Treasa School, MD  07/28/2016, 2:06 PM

## 2016-07-29 ENCOUNTER — Inpatient Hospital Stay (HOSPITAL_COMMUNITY): Payer: Medicare Other

## 2016-07-29 DIAGNOSIS — G931 Anoxic brain damage, not elsewhere classified: Secondary | ICD-10-CM

## 2016-07-29 DIAGNOSIS — I639 Cerebral infarction, unspecified: Secondary | ICD-10-CM

## 2016-07-29 LAB — GLUCOSE, CAPILLARY
GLUCOSE-CAPILLARY: 251 mg/dL — AB (ref 65–99)
GLUCOSE-CAPILLARY: 297 mg/dL — AB (ref 65–99)
Glucose-Capillary: 266 mg/dL — ABNORMAL HIGH (ref 65–99)
Glucose-Capillary: 306 mg/dL — ABNORMAL HIGH (ref 65–99)
Glucose-Capillary: 329 mg/dL — ABNORMAL HIGH (ref 65–99)
Glucose-Capillary: 348 mg/dL — ABNORMAL HIGH (ref 65–99)

## 2016-07-29 LAB — POCT I-STAT 3, ART BLOOD GAS (G3+)
Acid-base deficit: 5 mmol/L — ABNORMAL HIGH (ref 0.0–2.0)
Bicarbonate: 19.3 mmol/L — ABNORMAL LOW (ref 20.0–28.0)
O2 SAT: 100 %
PCO2 ART: 32.8 mmHg (ref 32.0–48.0)
PO2 ART: 171 mmHg — AB (ref 83.0–108.0)
TCO2: 20 mmol/L (ref 0–100)
pH, Arterial: 7.377 (ref 7.350–7.450)

## 2016-07-29 LAB — COMPREHENSIVE METABOLIC PANEL
ALK PHOS: 86 U/L (ref 38–126)
ALT: 50 U/L (ref 17–63)
AST: 99 U/L — AB (ref 15–41)
Albumin: 1.7 g/dL — ABNORMAL LOW (ref 3.5–5.0)
Anion gap: 15 (ref 5–15)
BILIRUBIN TOTAL: 1.6 mg/dL — AB (ref 0.3–1.2)
BUN: 70 mg/dL — AB (ref 6–20)
CALCIUM: 7.9 mg/dL — AB (ref 8.9–10.3)
CO2: 16 mmol/L — ABNORMAL LOW (ref 22–32)
CREATININE: 3.83 mg/dL — AB (ref 0.61–1.24)
Chloride: 105 mmol/L (ref 101–111)
GFR calc Af Amer: 17 mL/min — ABNORMAL LOW (ref >60)
GFR calc non Af Amer: 15 mL/min — ABNORMAL LOW (ref >60)
Glucose, Bld: 275 mg/dL — ABNORMAL HIGH (ref 65–99)
Potassium: 6.4 mmol/L (ref 3.5–5.1)
Sodium: 136 mmol/L (ref 135–145)
TOTAL PROTEIN: 6 g/dL — AB (ref 6.5–8.1)

## 2016-07-29 LAB — CBC
HCT: 27.1 % — ABNORMAL LOW (ref 39.0–52.0)
Hemoglobin: 7.8 g/dL — ABNORMAL LOW (ref 13.0–17.0)
MCH: 23.6 pg — ABNORMAL LOW (ref 26.0–34.0)
MCHC: 28.8 g/dL — ABNORMAL LOW (ref 30.0–36.0)
MCV: 82.1 fL (ref 78.0–100.0)
PLATELETS: 401 10*3/uL — AB (ref 150–400)
RBC: 3.3 MIL/uL — ABNORMAL LOW (ref 4.22–5.81)
RDW: 17.5 % — AB (ref 11.5–15.5)
WBC: 13.2 10*3/uL — AB (ref 4.0–10.5)

## 2016-07-29 MED ORDER — PIPERACILLIN-TAZOBACTAM IN DEX 2-0.25 GM/50ML IV SOLN
2.2500 g | Freq: Three times a day (TID) | INTRAVENOUS | Status: DC
  Administered 2016-07-29 – 2016-08-02 (×11): 2.25 g via INTRAVENOUS
  Filled 2016-07-29 (×12): qty 50

## 2016-07-29 MED ORDER — SODIUM POLYSTYRENE SULFONATE 15 GM/60ML PO SUSP
30.0000 g | Freq: Once | ORAL | Status: AC
  Administered 2016-07-29: 30 g
  Filled 2016-07-29: qty 120

## 2016-07-29 NOTE — Progress Notes (Addendum)
CBG 297, pt meets criteria for insulin gtt, however, potential plan to terminally extubate. 6 units novolog given, will continue to monitor.

## 2016-07-29 NOTE — Procedures (Signed)
ELECTROENCEPHALOGRAM REPORT  Date of Study: 07/29/2016  Patient's Name: [name] MRN: [id number] Date of Birth: Oct 22, 1950  Referring Provider: Dr. Melba Coon  Clinical History: This is a 66 year old man s/p cardiac arrest who remains poorly responsive.  Medications: fentaNYL (SUBLIMAZE) 2,500 mcg in sodium chloride 0.9 % 250 mL (10 mcg/mL) infusion  acetaminophen (TYLENOL) solution 650 mg  aspirin chewable tablet 81 mg  atorvastatin (LIPITOR) tablet 10 mg  hydrALAZINE (APRESOLINE) injection 10 mg  insulin aspart (novoLOG) injection 2-6 Units  insulin glargine (LANTUS) injection 10 Units  metoprolol (LOPRESSOR) injection 5 mg  norepinephrine (LEVOPHED) 4 mg in dextrose 5 % 250 mL (0.016 mg/mL) infusion  ondansetron (ZOFRAN) injection 4 mg  pantoprazole (PROTONIX) injection 40 mg  piperacillin-tazobactam (ZOSYN) IVPB 3.375 g   Technical Summary: A multichannel digital EEG recording measured by the international 10-20 system with electrodes applied with paste and impedances below 5000 ohms performed as portable with EKG monitoring in an intubated and sedated patient.  Hyperventilation and photic stimulation were not performed.  The digital EEG was referentially recorded, reformatted, and digitally filtered in a variety of bipolar and referential montages for optimal display.   Description: The patient is intubated and sedated on Fentanyl during the recording. There is no clear posterior dominant rhythm. The background showed a large amount of 4-5 Hz theta and 2-3 Hz delta slowing, at times sharply contoured with triphasic-like appearance. There are occasional 1-2 second periods of diffuse suppression. Normal sleep architecture is not seen. Hyperventilation and photic stimulation were not performed.  There were no epileptiform discharges or electrographic seizures seen.    EKG lead was unremarkable.  Impression: This sedated EEG is abnormal due to moderate diffuse background  slowing and periods of suppression.  Clinical Correlation of the above findings indicates diffuse cerebral dysfunction that is non-specific in etiology and can be seen with hypoxic/ischemic injury, toxic/metabolic encephalopathies, or medication effect from Fentanyl.  There were no electrographic seizures seen in this study. Clinical correlation is advised.   Ellouise Newer, M.D.

## 2016-07-29 NOTE — Progress Notes (Signed)
Progress Note  Patient Name: [name] Date of Encounter: 07/29/2016  Primary Cardiologist: Dr. Acie Fredrickson  Subjective   No acute events.    Inpatient Medications    Scheduled Meds: . aspirin  81 mg Oral Daily  . atorvastatin  10 mg Per Tube Daily  . chlorhexidine gluconate (MEDLINE KIT)  15 mL Mouth Rinse BID  . Chlorhexidine Gluconate Cloth  6 each Topical Daily  . heparin  5,000 Units Subcutaneous Q8H  . insulin aspart  2-6 Units Subcutaneous Q4H  . insulin glargine  10 Units Subcutaneous Q24H  . mouth rinse  15 mL Mouth Rinse 10 times per day  . metoprolol  5 mg Intravenous Q12H  . pantoprazole (PROTONIX) IV  40 mg Intravenous QHS  . piperacillin-tazobactam (ZOSYN)  IV  3.375 g Intravenous Q8H  . sodium chloride flush  10-40 mL Intracatheter Q12H   Continuous Infusions: . sodium chloride    . sodium chloride 50 mL/hr at 07/29/16 0700  . feeding supplement (VITAL AF 1.2 CAL) 1,000 mL (07/29/16 0700)  . fentaNYL infusion INTRAVENOUS 200 mcg/hr (07/29/16 0830)  . midazolam (VERSED) infusion Stopped (07/27/16 0100)  . norepinephrine (LEVOPHED) Adult infusion 0 mcg/min (07/27/16 0211)  . propofol (DIPRIVAN) infusion     PRN Meds: sodium chloride, acetaminophen (TYLENOL) oral liquid 160 mg/5 mL, fentaNYL, hydrALAZINE, ondansetron (ZOFRAN) IV, sodium chloride flush   Vital Signs    Vitals:   07/29/16 0500 07/29/16 0735 07/29/16 0757 07/29/16 0800  BP:  (!) 89/54    Pulse: 92  95 96  Resp: (!) 7  (!) 35 20  Temp:  (!) 101.1 F (38.4 C)    TempSrc:  Oral    SpO2: 100%  100% 100%  Weight:      Height:        Intake/Output Summary (Last 24 hours) at 07/29/16 0950 Last data filed at 07/29/16 0854  Gross per 24 hour  Intake             3618 ml  Output               52 ml  Net             3566 ml   Filed Weights   07/27/16 1149 07/28/16 0421 07/29/16 0414  Weight: 157 lb 10.1 oz (71.5 kg) 156 lb 4.9 oz (70.9 kg) 159 lb 9.8 oz (72.4 kg)    Telemetry    NSR  - Personally Reviewed  ECG  No new ECG  Physical Exam   GEN: sedated on vent   Neck: No JVD Cardiac: RRR,  Respiratory: Coarse breath sounds bilaterally. GI: Soft, nontender, non-distended  MS: No edema; No deformity. Neuro:  Nonfocal  Psych: Normal affect   Labs    Chemistry Recent Labs Lab 07/27/16 0419 07/28/16 0432 07/28/16 0625 07/29/16 0435  NA 142 140 141 136  K 4.8 6.2* 5.8* 6.4*  CL 110 107 107 105  CO2 21* 21* 20* 16*  GLUCOSE 103* 185* 148* 275*  BUN 14 34* 36* 70*  CREATININE 1.01 2.21* 2.30* 3.83*  CALCIUM 8.7* 8.5* 8.5* 7.9*  PROT 6.2* 6.3*  --  6.0*  ALBUMIN 2.2* 1.9*  --  1.7*  AST 36 41  --  99*  ALT 24 22  --  50  ALKPHOS 99 96  --  86  BILITOT 0.7 1.4*  --  1.6*  GFRNONAA >60 29* 28* 15*  GFRAA >60 33* 32* 17*  ANIONGAP 11 12  14 15     Hematology Recent Labs Lab 07/27/16 0419 07/28/16 0432 07/29/16 0435  WBC 15.7* 21.4* 13.2*  RBC 4.10* 3.61* 3.30*  HGB 9.8* 8.6* 7.8*  HCT 33.6* 29.9* 27.1*  MCV 82.0 82.8 82.1  MCH 23.9* 23.8* 23.6*  MCHC 29.2* 28.8* 28.8*  RDW 17.3* 17.2* 17.5*  PLT 476* 501* 401*    Cardiac Enzymes Recent Labs Lab 07/24/2016 1018 07/13/2016 1529 07/21/2016 1943 07/26/16 0355  TROPONINI 0.19* 0.46* 0.54* 0.49*    Recent Labs Lab 07/21/2016 0913  TROPIPOC 0.04     BNPNo results for input(s): BNP, PROBNP in the last 168 hours.   DDimer No results for input(s): DDIMER in the last 168 hours.   Radiology    Mr Brain Wo Contrast  Result Date: 07/27/2016 CLINICAL DATA:  66 y/o  M; cardiac arrest with hypothermia protocol. EXAM: MRI HEAD WITHOUT CONTRAST TECHNIQUE: Multiplanar, multiecho pulse sequences of the brain and surrounding structures were obtained without intravenous contrast. COMPARISON:  07/22/2016 CT head FINDINGS: Brain: Acute infarct centered in junction of left parietal, occipital, and temporal lobes spanning a region of 18 x 42 x 53 mm (AP x ML x CC series 5, image 27 and series 7, image 6). Mild  associated T2 FLAIR hyperintense signal abnormality and local mass effect. Small focus of susceptibility (series 9, image 13) may represent petechial hemorrhage. Small chronic lacunar infarct in the right cerebellar hemisphere and small right frontal periventricular white matter chronic infarct. Punctate focus of susceptibility hypointensity in subcortical frontal white matter bilaterally probably consistent with hemosiderin deposition of old microhemorrhage. Scattered foci of T2 FLAIR hyperintense signal abnormality in subcortical and periventricular white matter compatible with mild chronic microvascular ischemic changes. Mild brain parenchymal volume loss. Vascular: Normal flow voids. Skull and upper cervical spine: Advanced degenerative changes of the upper cervical spine partially visualized. Bone marrow signal is normal. Sinuses/Orbits: Mild diffuse paranasal sinus mucosal thickening and bilateral mastoid effusions probably related intubation. Bilateral intra-ocular lens replacement. Other: None. IMPRESSION: 1. Acute infarct centered in the junction of left parietal, occipital, and temporal lobes. Possible small focus of petechial hemorrhage. 2. Background of mild chronic microvascular ischemic changes and mild parenchymal volume loss of the brain. Small chronic lacunar infarcts in the right cerebellar hemisphere and right frontal periventricular white matter. These results will be called to the ordering clinician or representative by the Radiologist Assistant, and communication documented in the PACS or zVision Dashboard. Electronically Signed   By: Kristine Garbe M.D.   On: 07/27/2016 19:29   Dg Chest Port 1 View  Result Date: 07/29/2016 CLINICAL DATA:  Respiratory failure. EXAM: PORTABLE CHEST 1 VIEW COMPARISON:  07/28/2016. FINDINGS: Endotracheal tube, NG tube, right IJ line stable position. Cardiomegaly. Mild interstitial prominence noted particular on the right. Findings suggest mild CHF.  Pneumonitis cannot be excluded. Small bilateral pleural effusions. COPD. No pneumothorax. No acute bony abnormality. IMPRESSION: 1.  Lines and tubes in stable position. 2. Cardiomegaly with bilateral interstitial prominence, particulary on the right, and small pleural effusions suggesting congestive heart failure . Similar findings noted on prior exam. 3. COPD . Electronically Signed   By: Marcello Moores  Register   On: 07/29/2016 06:52   Dg Chest Port 1 View  Result Date: 07/28/2016 CLINICAL DATA:  Acute respiratory failure with hypoxia. EXAM: PORTABLE CHEST 1 VIEW COMPARISON:  07/27/2016 FINDINGS: There has been mild improvement in the lung base interstitial and airspace opacities. No new lung abnormalities. No pneumothorax. The endotracheal tube, nasal/ orogastric tube and  right internal jugular central venous line are stable and well positioned. IMPRESSION: 1. Mild improvement in interstitial airspace lung base opacities. No new abnormalities. 2. Stable support apparatus. Electronically Signed   By: Lajean Manes M.D.   On: 07/28/2016 10:20    Cardiac Studies   Cath reviewed, no significnat CAD  Patient Profile     66 y.o. male with cardiac arrest, NICM  Assessment & Plan    1) Cardiac arrest: Unclear etiology of arrest.  Rhythm stable at this time.  Further cardiac w/u dependent on neuro recovery.  Acute CVA noted.  2) ARF: Cr. Rising.   3) Vent per CCM.   Signed, Larae Grooms, MD  07/29/2016, 9:50 AM

## 2016-07-29 NOTE — Progress Notes (Signed)
Critical values received for Cmet panel. Expected values, ELink made aware.

## 2016-07-29 NOTE — Progress Notes (Signed)
EEG Completed; Results Pending  

## 2016-07-29 NOTE — Progress Notes (Signed)
[facility] Progress Note Patient Name: [name] DOB: 1951/01/11 MRN: [id number]   Date of Service  07/29/2016  HPI/Events of Note  K+ = 6.4.  eICU Interventions  Will order: 1. Kayexalate 30 gm per tube now.  2. Repeat BMP at 12 noon.     Intervention Category Major Interventions: Electrolyte abnormality - evaluation and management  Azzie Thiem Eugene 07/29/2016, 5:40 AM

## 2016-07-29 NOTE — Progress Notes (Signed)
PULMONARY / CRITICAL CARE MEDICINE   Name: [name] MRN: [id number] DOB: Sep 18, 1950    ADMISSION DATE:  07/07/2016  REFERRING MD:  Tegeler   CHIEF COMPLAINT:  VF arrest  HISTORY OF PRESENT ILLNESS:  66 year old male w/ h/o HTN, DM type II and prior CVA. Also remote h/o hodgkin's lymphoma. Found down in bathroom after a fall. CPR initiated by spouse. Initial rhythm was VF on EMS arrival. CPR continued, shocked x1. Time to ROSC estimated at 7 minutes. Pt unresponsive on arrival w/ report of possible localization to noxious stim, cough and later decerebrate posturing. Placed in C collar, CT head negative, rushed to cardiac cath lab PCCM to admit. Cath neg with minimal CAD > hypothermia protocol initiated.   SUBJECTIVE:   Fever overnight, now with fluctuant BP and low UOP.    VITAL SIGNS: BP (!) 89/54 (BP Location: Left Arm)   Pulse 96   Temp (!) 101.1 F (38.4 C) (Oral)   Resp 20   Ht 5\' 5"  (1.651 m)   Wt 72.4 kg (159 lb 9.8 oz)   SpO2 100%   BMI 26.56 kg/m   HEMODYNAMICS: CVP:  [7 mmHg-13 mmHg] 7 mmHg  VENTILATOR SETTINGS: Vent Mode: PRVC FiO2 (%):  [50 %-60 %] 50 % Set Rate:  [35 bmp] 35 bmp Vt Set:  [530 mL] 530 mL PEEP:  [10 cmH20] 10 cmH20 Plateau Pressure:  [25 cmH20-31 cmH20] 25 cmH20  INTAKE / OUTPUT: I/O last 3 completed shifts: In: [id number] [I.V.:2460; NG/GT:2425; IV Piggyback:250] Out: 265 [Urine:235; Emesis/NG output:30]  PHYSICAL EXAMINATION: General:  Ill appearing adult male in NAD on vent  HEENT: MM pink/moist, ETT  Neuro:  Sedated on fentanyl, pupils are sluggish, not moving any ext to command. CV: RRR, Nl S1/S2, -M/R/G. PULM: CTA bilaterally GI: Soft, NT, ND and +BS Extremities: -edema and -tenderness Skin: no rashes or lesions  LABS:  BMET  Recent Labs Lab 07/28/16 0432 07/28/16 0625 07/29/16 0435  NA 140 141 136  K 6.2* 5.8* 6.4*  CL 107 107 105  CO2 21* 20* 16*  BUN 34* 36* 70*  CREATININE 2.21* 2.30* 3.83*  GLUCOSE 185* 148* 275*    Electrolytes  Recent Labs Lab 07/27/16 1827 07/28/16 0432 07/28/16 0625 07/28/16 1701 07/29/16 0435  CALCIUM  --  8.5* 8.5*  --  7.9*  MG 2.1 2.4  --  2.5*  --   PHOS 6.3* 7.5*  --  8.3*  --    CBC  Recent Labs Lab 07/27/16 0419 07/28/16 0432 07/29/16 0435  WBC 15.7* 21.4* 13.2*  HGB 9.8* 8.6* 7.8*  HCT 33.6* 29.9* 27.1*  PLT 476* 501* 401*   Coag's  Recent Labs Lab 07/13/2016 0902 07/21/2016 1018 07/31/2016 1740  APTT  --  40* 49*  INR 1.25 1.20 1.22   Sepsis Markers  Recent Labs Lab 07/22/2016 0915 07/13/2016 1038  LATICACIDVEN 2.42* 0.8   ABG  Recent Labs Lab 07/27/16 1556 07/28/16 0350 07/28/16 0629  PHART 7.217* 7.189* 7.219*  PCO2ART 51.4* 57.4* 51.3*  PO2ART 85.0 119* 94.0   Liver Enzymes  Recent Labs Lab 07/27/16 0419 07/28/16 0432 07/29/16 0435  AST 36 41 99*  ALT 24 22 50  ALKPHOS 99 96 86  BILITOT 0.7 1.4* 1.6*  ALBUMIN 2.2* 1.9* 1.7*   Cardiac Enzymes  Recent Labs Lab 07/24/2016 1529 07/22/2016 1943 07/26/16 0355  TROPONINI 0.46* 0.54* 0.49*   Glucose  Recent Labs Lab 07/28/16 0425 07/28/16 0818 07/28/16 1149 07/28/16 1645 07/29/16  0412 07/29/16 0734  GLUCAP 157* 93 124* 171* 251* 297*   Imaging Dg Chest Port 1 View  Result Date: 07/29/2016 CLINICAL DATA:  Respiratory failure. EXAM: PORTABLE CHEST 1 VIEW COMPARISON:  07/28/2016. FINDINGS: Endotracheal tube, NG tube, right IJ line stable position. Cardiomegaly. Mild interstitial prominence noted particular on the right. Findings suggest mild CHF. Pneumonitis cannot be excluded. Small bilateral pleural effusions. COPD. No pneumothorax. No acute bony abnormality. IMPRESSION: 1.  Lines and tubes in stable position. 2. Cardiomegaly with bilateral interstitial prominence, particulary on the right, and small pleural effusions suggesting congestive heart failure . Similar findings noted on prior exam. 3. COPD . Electronically Signed   By: Marcello Moores  Register   On: 07/29/2016 06:52    STUDIES:  CT head 2/22 >>no bleed Left heart cath 2/22 >> cad min 40% Echo 2/22 >> LV wnl, R Vnormal EEG 2/22 no focus, global suppression MRI 2/24 >> acute infarct centered in the junction of left parietal, occipital & temporal lobes, background mild chronic microvascular ischemic changes, chronic lacunar infarcts in the right cerebellar hemisphere & right frontal periventricular white matter  CULTURES: None  ANTIBIOTICS: Zosyn 2/23>>>  SIGNIFICANT EVENTS: 2/22 admitted s/p VF arrest. Time to ROSC estimated at 7 minutes. Rushed to cardiac cath lab.  2/24 MRI concerning for acute infarct > neuro recommended no acute interventions   LINES/TUBES: OETT 2/22 >> R IJ 2/22 >> R rad aline 2/22 >>  DISCUSSION: 66 year old male admitted s/p VF arrest. Time to ROSC 7 minutes.  To cath lab with minimal disease.  Hypothermia protocol.  Rewarmed.  MRI with acute infarct in    ASSESSMENT / PLAN:  PULMONARY A: Cardiopulmonary arrest w/ resultant acute respiratory failure  Hypoxia / Desaturations - noted 2/24 Mild interstitial edema / Pleural Effusions P:   Continue vent support  PRVC 8 cc/kg, rate 35 Follow ABG > repeat pm 2/25 Trend PCXR Hold weaning, likely withdrawal in AM.  CARDIOVASCULAR A:  VF arrest - unclear etiology LV fxn wnl RV wnl, unlikley PE Syncope associayed with VF HTN event 2/22 resolved after foley irrigated P:  Tele monitoring  NO CPR in event of arrest  DNR, continue current level of support for now, family meeting in AM for likely withdrawal after family meeting in AM.  RENAL A:   Hypomagnesemia  Mild lactic acidosis s/p cardiac arrest Hyperkalemia - suspect in setting of acidosis P:   Trend BMP  NS @ 50 ml/hr  Replace electrolytes as indicated  GASTROINTESTINAL A:   Protein Calorie Malnutrition   P:   NPO  TF per nutrition  PPI for SUP   HEMATOLOGIC A:   Anemia (h/o FESO4 def) P:  Trend CBC  Heparin SQ   INFECTIOUS A:    Febrile overnight BP dropping P:   Normothermia goal  Monitor fever curve / wbc No escalation of abx If decompensates overnight then no pressors.  ENDOCRINE A:   DM w/ Hyperglycemia  P:   SSI  Lantus 10 units  NEUROLOGIC A:   Acute encephalopathy s/p arrest - initial CT head negative for bleed. Some chronic grey matter disease  S/p fall CVA - left temporal, parietal and occipital involvement Remote CVA R/o [place] as cause arrest P:   RASS goal: 0 to -1  Fentanyl gtt for pain / sedation  C-collar until can be cleared clinically  Frequent neuro exams Neuro consult, appreciate input, will await neuro's input then arrange for meeting in AM for likely withdrawal to  comfort measures per discussion with family.  FAMILY  - Updates:  Wife updated in conference room on MRI findings. She reports he would not want to "live" on machines and if he could not have a reasonable recovery from CVA, would not want to live in a facility / require feeding tubes etc.  She also would not want to prolonged suffering or perform ACLS in the event of recurrent arrest.  Spoke with wife 2/26, verified above, no further escalation of care.  Likely withdrawal in AM.  - Inter-disciplinary family meet or Palliative Care meeting due by: completed 2/25  The patient is critically ill with multiple organ systems failure and requires high complexity decision making for assessment and support, frequent evaluation and titration of therapies, application of advanced monitoring technologies and extensive interpretation of multiple databases.   Critical Care Time devoted to patient care services described in this note is  35  Minutes. This time reflects time of care of this signee Dr Jennet Maduro. This critical care time does not reflect procedure time, or teaching time or supervisory time of PA/NP/Med student/Med Resident etc but could involve care discussion time.  Rush Farmer, M.D. [facility] Pulmonary/Critical  Care Medicine. Pager: [phone]. After hours pager: [phone].  07/29/2016, 11:39 AM

## 2016-07-30 ENCOUNTER — Inpatient Hospital Stay (HOSPITAL_COMMUNITY): Payer: Medicare Other

## 2016-07-30 ENCOUNTER — Encounter (HOSPITAL_COMMUNITY): Payer: Self-pay | Admitting: Radiology

## 2016-07-30 LAB — CBC
HEMATOCRIT: 22.7 % — AB (ref 39.0–52.0)
Hemoglobin: 7 g/dL — ABNORMAL LOW (ref 13.0–17.0)
MCH: 23.7 pg — ABNORMAL LOW (ref 26.0–34.0)
MCHC: 30.8 g/dL (ref 30.0–36.0)
MCV: 76.9 fL — ABNORMAL LOW (ref 78.0–100.0)
PLATELETS: 339 10*3/uL (ref 150–400)
RBC: 2.95 MIL/uL — ABNORMAL LOW (ref 4.22–5.81)
RDW: 17.6 % — AB (ref 11.5–15.5)
WBC: 10.7 10*3/uL — AB (ref 4.0–10.5)

## 2016-07-30 LAB — BASIC METABOLIC PANEL
ANION GAP: 17 — AB (ref 5–15)
Anion gap: 17 — ABNORMAL HIGH (ref 5–15)
BUN: 89 mg/dL — ABNORMAL HIGH (ref 6–20)
BUN: 90 mg/dL — ABNORMAL HIGH (ref 6–20)
CALCIUM: 7.9 mg/dL — AB (ref 8.9–10.3)
CALCIUM: 7.9 mg/dL — AB (ref 8.9–10.3)
CHLORIDE: 109 mmol/L (ref 101–111)
CO2: 16 mmol/L — ABNORMAL LOW (ref 22–32)
CO2: 14 mmol/L — AB (ref 22–32)
CREATININE: 5.21 mg/dL — AB (ref 0.61–1.24)
CREATININE: 5.47 mg/dL — AB (ref 0.61–1.24)
Chloride: 106 mmol/L (ref 101–111)
GFR calc non Af Amer: 10 mL/min — ABNORMAL LOW (ref >60)
GFR, EST AFRICAN AMERICAN: 12 mL/min — AB (ref >60)
GFR, EST AFRICAN AMERICAN: 11 mL/min — AB (ref >60)
GFR, EST NON AFRICAN AMERICAN: 10 mL/min — AB (ref >60)
Glucose, Bld: 365 mg/dL — ABNORMAL HIGH (ref 65–99)
Glucose, Bld: 421 mg/dL — ABNORMAL HIGH (ref 65–99)
Potassium: 4.6 mmol/L (ref 3.5–5.1)
Potassium: 4.4 mmol/L (ref 3.5–5.1)
SODIUM: 139 mmol/L (ref 135–145)
SODIUM: 140 mmol/L (ref 135–145)

## 2016-07-30 LAB — BLOOD GAS, ARTERIAL
Acid-base deficit: 7.4 mmol/L — ABNORMAL HIGH (ref 0.0–2.0)
BICARBONATE: 17 mmol/L — AB (ref 20.0–28.0)
Drawn by: 419771
FIO2: 35
LHR: 35 {breaths}/min
O2 Saturation: 97.9 %
PATIENT TEMPERATURE: 98.6
PCO2 ART: 30.5 mmHg — AB (ref 32.0–48.0)
PEEP: 10 cmH2O
PO2 ART: 112 mmHg — AB (ref 83.0–108.0)
VT: 530 mL
pH, Arterial: 7.365 (ref 7.350–7.450)

## 2016-07-30 LAB — GLUCOSE, CAPILLARY
GLUCOSE-CAPILLARY: 315 mg/dL — AB (ref 65–99)
Glucose-Capillary: 361 mg/dL — ABNORMAL HIGH (ref 65–99)
Glucose-Capillary: 370 mg/dL — ABNORMAL HIGH (ref 65–99)
Glucose-Capillary: 384 mg/dL — ABNORMAL HIGH (ref 65–99)
Glucose-Capillary: 375 mg/dL — ABNORMAL HIGH (ref 65–99)

## 2016-07-30 LAB — PHOSPHORUS: Phosphorus: 6.2 mg/dL — ABNORMAL HIGH (ref 2.5–4.6)

## 2016-07-30 LAB — MAGNESIUM: MAGNESIUM: 2.5 mg/dL — AB (ref 1.7–2.4)

## 2016-07-30 MED ORDER — INSULIN ASPART 100 UNIT/ML ~~LOC~~ SOLN
0.0000 [IU] | Freq: Three times a day (TID) | SUBCUTANEOUS | Status: DC
  Administered 2016-07-30 (×2): 20 [IU] via SUBCUTANEOUS

## 2016-07-30 MED ORDER — CHLORHEXIDINE GLUCONATE 0.12 % MT SOLN
OROMUCOSAL | Status: AC
  Filled 2016-07-30: qty 15

## 2016-07-30 NOTE — Progress Notes (Signed)
Subjective: Slight improvement overnight  Exam: Vitals:   07/30/16 1200 07/30/16 1300  BP:    Pulse: 91 93  Resp: (!) 35 (!) 35  Temp:     Gen: In bed, NAD Resp: non-labored breathing, no acute distress Abd: soft, nt  Neuro: MS: opens eyes to name. Does not follow commands.  [id number] rightwardly deviated.  Motor: minimal withdrawal on left, no movement on the right.  Sensory:as above.   Impression: 66 yo M s/p cardiac arrest with post-arrest encephalopathy. He does have some right sided weakness and rightward gaze deviation which I would not typically expect from the stroke that he had. I would favor repeating this to look for new lesions.   Recommendations: 1) MRI brain  2) EEG 3) will follow.   Roland Rack, MD [facility] [phone]  If 7pm- 7am, please page neurology on call as listed in [place].

## 2016-07-30 NOTE — Progress Notes (Signed)
Inpatient Diabetes Program Recommendations  AACE/ADA: New Consensus Statement on Inpatient Glycemic Control (2015)  Target Ranges:  Prepandial:   less than 140 mg/dL      Peak postprandial:   less than 180 mg/dL (1-2 hours)      Critically ill patients:  140 - 180 mg/dL   Lab Results  Component Value Date   GLUCAP 361 (H) 07/30/2016   HGBA1C 6.5 07/05/2016    Review of Glycemic Control Results for [name], [name] (MRN [id number]) as of 07/30/2016 09:29  Ref. Range 07/29/2016 15:24 07/29/2016 20:01 07/29/2016 23:32 07/30/2016 03:40 07/30/2016 08:17  Glucose-Capillary Latest Ref Range: 65 - 99 mg/dL 348 (H) 266 (H) 329 (H) 315 (H) 361 (H)   Inpatient Diabetes Program Recommendations:  While on tube feeds, please consider: -Novolog 4 units tube feed coverage q 4 hrs (hold if tubefeeding held or stopped for any reason)   Thank you, Bethena Roys E. Rommel Hogston, RN, MSN, CDE Inpatient Glycemic Control Team Team Pager [phone] (8am-5pm) 07/30/2016 9:32 AM

## 2016-07-30 NOTE — Progress Notes (Signed)
EEG completed, results pending. 

## 2016-07-30 NOTE — Progress Notes (Signed)
PULMONARY / CRITICAL CARE MEDICINE   Name: [name] MRN: [id number] DOB: 1951-04-15    ADMISSION DATE:  07/19/2016  REFERRING MD:  Tegeler   CHIEF COMPLAINT:  VF arrest  HISTORY OF PRESENT ILLNESS:  66 year old male w/ h/o HTN, DM type II and prior CVA. Also remote h/o hodgkin's lymphoma. Found down in bathroom after a fall. CPR initiated by spouse. Initial rhythm was VF on EMS arrival. CPR continued, shocked x1. Time to ROSC estimated at 7 minutes. Pt unresponsive on arrival w/ report of possible localization to noxious stim, cough and later decerebrate posturing. Placed in C collar, CT head negative, rushed to cardiac cath lab PCCM to admit. Cath neg with minimal CAD > hypothermia protocol initiated.   SUBJECTIVE:   No events overnight, unresponsive  VITAL SIGNS: BP (!) 116/55 (BP Location: Left Arm)   Pulse 79   Temp (!) 101.1 F (38.4 C) (Oral)   Resp (!) 35   Ht 5\' 5"  (1.651 m)   Wt 72.3 kg (159 lb 6.3 oz)   SpO2 98%   BMI 26.52 kg/m   HEMODYNAMICS: CVP:  [7 mmHg-11 mmHg] 10 mmHg  VENTILATOR SETTINGS: Vent Mode: PRVC FiO2 (%):  [35 %-50 %] 35 % Set Rate:  [35 bmp] 35 bmp Vt Set:  [530 mL] 530 mL PEEP:  [10 cmH20] 10 cmH20 Plateau Pressure:  [25 cmH20-27 cmH20] 25 cmH20  INTAKE / OUTPUT: I/O last 3 completed shifts: In: 5374.8 [I.V.:2834.8; NG/GT:2290; IV Piggyback:250] Out: 191 [Urine:191]  PHYSICAL EXAMINATION: General:  Acutely ill appearing adult male in NAD on vent, not weaning HEENT: MM pink/moist, ETT  Neuro:  Off fentanyl, pupils are sluggish, not moving any ext to command, gazes to the right CV: RRR, Nl S1/S2, -M/R/G. PULM: CTA bilaterally GI: Soft, NT, ND and +BS Extremities: -edema and -tenderness Skin: no rashes or lesions  LABS:  BMET  Recent Labs Lab 07/28/16 0625 07/29/16 0435 07/30/16 0340  NA 141 136 139  K 5.8* 6.4* 4.6  CL 107 105 106  CO2 20* 16* 16*  BUN 36* 70* 89*  CREATININE 2.30* 3.83* 5.21*  GLUCOSE 148* 275* 365*    Electrolytes  Recent Labs Lab 07/28/16 0432 07/28/16 0625 07/28/16 1701 07/29/16 0435 07/30/16 0340  CALCIUM 8.5* 8.5*  --  7.9* 7.9*  MG 2.4  --  2.5*  --  2.5*  PHOS 7.5*  --  8.3*  --  6.2*   CBC  Recent Labs Lab 07/28/16 0432 07/29/16 0435 07/30/16 0340  WBC 21.4* 13.2* 10.7*  HGB 8.6* 7.8* 7.0*  HCT 29.9* 27.1* 22.7*  PLT 501* 401* 339   Coag's  Recent Labs Lab 07/18/2016 0902 07/21/2016 1018 07/08/2016 1740  APTT  --  40* 49*  INR 1.25 1.20 1.22   Sepsis Markers  Recent Labs Lab 07/14/2016 0915 07/06/2016 1038  LATICACIDVEN 2.42* 0.8   ABG  Recent Labs Lab 07/28/16 0629 07/29/16 1442 07/30/16 0350  PHART 7.219* 7.377 7.365  PCO2ART 51.3* 32.8 30.5*  PO2ART 94.0 171.0* 112*   Liver Enzymes  Recent Labs Lab 07/27/16 0419 07/28/16 0432 07/29/16 0435  AST 36 41 99*  ALT 24 22 50  ALKPHOS 99 96 86  BILITOT 0.7 1.4* 1.6*  ALBUMIN 2.2* 1.9* 1.7*   Cardiac Enzymes  Recent Labs Lab 07/20/2016 1529 07/22/2016 1943 07/26/16 0355  TROPONINI 0.46* 0.54* 0.49*   Glucose  Recent Labs Lab 07/29/16 1225 07/29/16 1524 07/29/16 2001 07/29/16 2332 07/30/16 0340 07/30/16 0817  GLUCAP  306* 348* 266* 329* 315* 361*   Imaging Dg Chest Port 1 View  Result Date: 07/30/2016 CLINICAL DATA:  66 year old male cardiac arrest. Left cerebral infarct. Respiratory failure with hypoxia. Initial encounter. EXAM: PORTABLE CHEST 1 VIEW COMPARISON:  07/29/2016 and earlier. FINDINGS: Portable AP semi upright view at 0501 hours. Endotracheal tube tip is stable up the clavicles. Enteric tube courses to the abdomen. Stable right IJ central line. Large lung volumes. Stable cardiac size and mediastinal contours. No pneumothorax or pulmonary edema. Veiling bibasilar pulmonary opacity is progressed since yesterday. IMPRESSION: 1.  Stable lines and tubes. 2. Progressed bibasilar pulmonary opacity since yesterday. Favor increasing pleural effusions with lung base atelectasis or  consolidation. Electronically Signed   By: Genevie Ann M.D.   On: 07/30/2016 07:06   STUDIES:  CT head 2/22 >>no bleed Left heart cath 2/22 >> cad min 40% Echo 2/22 >> LV wnl, R Vnormal EEG 2/22 no focus, global suppression MRI 2/24 >> acute infarct centered in the junction of left parietal, occipital & temporal lobes, background mild chronic microvascular ischemic changes, chronic lacunar infarcts in the right cerebellar hemisphere & right frontal periventricular white matter  CULTURES: None  ANTIBIOTICS: Zosyn 2/23>>>  SIGNIFICANT EVENTS: 2/22 admitted s/p VF arrest. Time to ROSC estimated at 7 minutes. Rushed to cardiac cath lab.  2/24 MRI concerning for acute infarct > neuro recommended no acute interventions   LINES/TUBES: OETT 2/22 >> R IJ 2/22 >> R rad aline 2/22 >>  DISCUSSION: 66 year old male admitted s/p VF arrest. Time to ROSC 7 minutes.  To cath lab with minimal disease.  Hypothermia protocol.  Rewarmed.  MRI with acute infarct in   ASSESSMENT / PLAN:  PULMONARY A: Cardiopulmonary arrest w/ resultant acute respiratory failure  Hypoxia / Desaturations - noted 2/24 Mild interstitial edema / Pleural Effusions P:   Continue vent support  PRVC 8 cc/kg, rate 35 Follow ABG Trend PCXR Hold weaning given neuro status  CARDIOVASCULAR A:  VF arrest - unclear etiology LV fxn wnl RV wnl, unlikley PE Syncope associayed with VF HTN event 2/22 resolved after foley irrigated P:  Tele monitoring  DNR per discussion below  RENAL A:   Hypomagnesemia  Mild lactic acidosis s/p cardiac arrest Hyperkalemia - suspect in setting of acidosis Cr much worse P:   Trend BMP  KVO IVF Replace electrolytes as indicated  GASTROINTESTINAL A:   Protein Calorie Malnutrition   P:   NPO  TF per nutrition  PPI for SUP   HEMATOLOGIC A:   Anemia (h/o FESO4 def) P:  Trend CBC  Heparin SQ   INFECTIOUS A:   Febrile overnight BP dropping P:   Normothermia goal  complete Monitor fever curve / wbc No escalation of abx No pressors  ENDOCRINE A:   DM w/ Hyperglycemia  P:   SSI  Lantus 10 units  NEUROLOGIC A:   Acute encephalopathy s/p arrest - initial CT head negative for bleed. Some chronic grey matter disease  S/p fall CVA - left temporal, parietal and occipital involvement Remote CVA R/o [place] as cause arrest P:   RASS goal: 0 to -1  Fentanyl gtt for pain / sedation  C-collar until can be cleared clinically  Frequent neuro exams  FAMILY  - Updates:  Wife, son and daughter updated, patient is making no improvement and renal function is worsening.  There is no chance at recovery here.  Family does not want trach/peg but not quite ready for discharge.  Will  re-meet again in AM but family was ok with full DNR status.  Will change status to DNR.  - Inter-disciplinary family meet or Palliative Care meeting due by: completed 2/25  The patient is critically ill with multiple organ systems failure and requires high complexity decision making for assessment and support, frequent evaluation and titration of therapies, application of advanced monitoring technologies and extensive interpretation of multiple databases.   Critical Care Time devoted to patient care services described in this note is  35  Minutes. This time reflects time of care of this signee Dr Jennet Maduro. This critical care time does not reflect procedure time, or teaching time or supervisory time of PA/NP/Med student/Med Resident etc but could involve care discussion time.  Rush Farmer, M.D. [facility] Pulmonary/Critical Care Medicine. Pager: [phone]. After hours pager: [phone].  07/30/2016, 11:02 AM

## 2016-07-30 NOTE — Procedures (Signed)
ELECTROENCEPHALOGRAM REPORT  Date of Study: 07/30/2016  Patient's Name: [name] MRN: [id number] Date of Birth: 02-May-1951  Referring Provider: Dr. Roland Rack  Clinical History: This is a 66 year old man s/p cardiac arrest who remains poorly responsive with right-sided weakness and right gaze deviation.  Medications: fentaNYL (SUBLIMAZE) 2,500 mcg in sodium chloride 0.9 % 250 mL (10 mcg/mL) infusion  acetaminophen (TYLENOL) solution 650 mg  aspirin chewable tablet 81 mg  atorvastatin (LIPITOR) tablet 10 mg  hydrALAZINE (APRESOLINE) injection 10 mg  insulin aspart (novoLOG) injection 0-20 Units  MEDLINE mouth rinse  metoprolol (LOPRESSOR) injection 5 mg  norepinephrine (LEVOPHED) 4 mg in dextrose 5 % 250 mL (0.016 mg/mL) infusion  ondansetron (ZOFRAN) injection 4 mg  pantoprazole (PROTONIX) injection 40 mg  piperacillin-tazobactam (ZOSYN) IVPB 2.25 g   Technical Summary: A multichannel digital EEG recording measured by the international 10-20 system with electrodes applied with paste and impedances below 5000 ohms performed as portable with EKG monitoring in an intubated and sedated patient.  Hyperventilation and photic stimulation were not performed.  The digital EEG was referentially recorded, reformatted, and digitally filtered in a variety of bipolar and referential montages for optimal display.   Description: The patient is intubated and sedated on Fentanyl during the recording. There is no clear posterior dominant rhythm. The background consists of diffuse low voltage theta and delta slowing. Normal sleep architecture is not seen. There was no focal slowing seen, no epileptiform discharges or electrographic seizures noted. Patient is noted by staff to have right gaze deviation during the study.   EKG lead was unremarkable.  Impression: This sedated EEG is abnormal due to mild to moderate diffuse slowing of the background.  Clinical Correlation of the above  findings indicates diffuse cerebral dysfunction that is non-specific in etiology and can be seen with hypoxic/ischemic injury, toxic/metabolic encephalopathies, or medication effect from Fentanyl.  The absence of epileptiform discharges does not rule out a clinical diagnosis of epilepsy.  Clinical correlation is advised.   Ellouise Newer, M.D.

## 2016-07-31 ENCOUNTER — Inpatient Hospital Stay (HOSPITAL_COMMUNITY): Payer: Medicare Other

## 2016-07-31 ENCOUNTER — Other Ambulatory Visit: Payer: Self-pay | Admitting: Family Medicine

## 2016-07-31 LAB — BASIC METABOLIC PANEL
Anion gap: 16 — ABNORMAL HIGH (ref 5–15)
BUN: 102 mg/dL — AB (ref 6–20)
CALCIUM: 8.2 mg/dL — AB (ref 8.9–10.3)
CHLORIDE: 108 mmol/L (ref 101–111)
CO2: 15 mmol/L — AB (ref 22–32)
CREATININE: 5.9 mg/dL — AB (ref 0.61–1.24)
GFR calc Af Amer: 10 mL/min — ABNORMAL LOW (ref >60)
GFR calc non Af Amer: 9 mL/min — ABNORMAL LOW (ref >60)
Glucose, Bld: 509 mg/dL (ref 65–99)
Potassium: 4.9 mmol/L (ref 3.5–5.1)
Sodium: 139 mmol/L (ref 135–145)

## 2016-07-31 LAB — BLOOD GAS, ARTERIAL
ACID-BASE DEFICIT: 9.5 mmol/L — AB (ref 0.0–2.0)
Bicarbonate: 15 mmol/L — ABNORMAL LOW (ref 20.0–28.0)
DRAWN BY: 41977
FIO2: 35
MECHVT: 530 mL
O2 SAT: 95.6 %
PCO2 ART: 28.1 mmHg — AB (ref 32.0–48.0)
PEEP/CPAP: 10 cmH2O
Patient temperature: 98.6
RATE: 35 resp/min
pH, Arterial: 7.347 — ABNORMAL LOW (ref 7.350–7.450)
pO2, Arterial: 87.7 mmHg (ref 83.0–108.0)

## 2016-07-31 LAB — CBC
HEMATOCRIT: 22.3 % — AB (ref 39.0–52.0)
HEMOGLOBIN: 6.9 g/dL — AB (ref 13.0–17.0)
MCH: 24.1 pg — AB (ref 26.0–34.0)
MCHC: 30.9 g/dL (ref 30.0–36.0)
MCV: 78 fL (ref 78.0–100.0)
Platelets: 274 10*3/uL (ref 150–400)
RBC: 2.86 MIL/uL — ABNORMAL LOW (ref 4.22–5.81)
RDW: 17.9 % — AB (ref 11.5–15.5)
WBC: 8.1 10*3/uL (ref 4.0–10.5)

## 2016-07-31 LAB — MAGNESIUM: Magnesium: 2.4 mg/dL (ref 1.7–2.4)

## 2016-07-31 LAB — PHOSPHORUS: PHOSPHORUS: 5.8 mg/dL — AB (ref 2.5–4.6)

## 2016-07-31 LAB — GLUCOSE, CAPILLARY: GLUCOSE-CAPILLARY: 452 mg/dL — AB (ref 65–99)

## 2016-07-31 MED ORDER — SODIUM CHLORIDE 0.9 % IV SOLN
INTRAVENOUS | Status: DC
  Administered 2016-07-31: 3.9 [IU]/h via INTRAVENOUS
  Filled 2016-07-31 (×2): qty 2.5

## 2016-07-31 MED ORDER — INSULIN ASPART 100 UNIT/ML ~~LOC~~ SOLN: 0.0000 [IU] | SUBCUTANEOUS | Status: DC

## 2016-07-31 MED ORDER — INSULIN ASPART 100 UNIT/ML ~~LOC~~ SOLN
25.0000 [IU] | Freq: Once | SUBCUTANEOUS | Status: AC
  Administered 2016-07-31: 25 [IU] via SUBCUTANEOUS

## 2016-07-31 NOTE — Progress Notes (Signed)
CRITICAL VALUE ALERT  Critical value received: Glucose 509 & Hgb 6.9  Date of notification:  07/31/16  Time of notification:  0530  Critical value read back:Yes.    Nurse who received alert:  Fredonia Highland, RN   MD notified (1st page):  E-Link MD   Time of first page:  [phone]  New orders to give insulin and no orders for transfusion.  Lelend Heinecke E Reola Mosher, [place]

## 2016-07-31 NOTE — Progress Notes (Signed)
[place] Progress Note Patient Name: [name] DOB: 1950/08/06 MRN: [id number]   Date of Service  07/31/2016  HPI/Events of Note  Multiple issues: 1. Hyperglycemia - Blood glucose = 509 and 2. Hgb = 6.9. Bedside nurse states that family may withdraw care today. Therefore, will defer transfusion to rounding team.   eICU Interventions  Will order: 1. Novolog 256 units [place] now. 2. Increase insulin to Q 4 hour resistant Novolog SSI.      Intervention Category Major Interventions: Hyperglycemia - active titration of insulin therapy  Lysle Dingwall 07/31/2016, 5:36 AM

## 2016-07-31 NOTE — Progress Notes (Signed)
PULMONARY / CRITICAL CARE MEDICINE   Name: [name] MRN: [id number] DOB: 09-17-1950    ADMISSION DATE:  07/15/2016  REFERRING MD:  Tegeler   CHIEF COMPLAINT:  VF arrest  HISTORY OF PRESENT ILLNESS:  66 year old male w/ h/o HTN, DM type II and prior CVA. Also remote h/o hodgkin's lymphoma. Found down in bathroom after a fall. CPR initiated by spouse. Initial rhythm was VF on EMS arrival. CPR continued, shocked x1. Time to ROSC estimated at 7 minutes. Pt unresponsive on arrival w/ report of possible localization to noxious stim, cough and later decerebrate posturing. Placed in C collar, CT head negative, rushed to cardiac cath lab PCCM to admit. Cath neg with minimal CAD > hypothermia protocol initiated.   SUBJECTIVE:   No events overnight  VITAL SIGNS: BP (!) 119/57   Pulse 88   Temp 100.1 F (37.8 C) (Axillary)   Resp (!) 35   Ht 5\' 5"  (1.651 m)   Wt 74.1 kg (163 lb 5.8 oz)   SpO2 100%   BMI 27.18 kg/m   HEMODYNAMICS:    VENTILATOR SETTINGS: Vent Mode: PRVC FiO2 (%):  [35 %] 35 % Set Rate:  [35 bmp] 35 bmp Vt Set:  [530 mL] 530 mL PEEP:  [10 cmH20] 10 cmH20 Plateau Pressure:  [21 cmH20-28 cmH20] 21 cmH20  INTAKE / OUTPUT: I/O last 3 completed shifts: In: 5253.3 [I.V.:2508.3; NG/GT:2495; IV Piggyback:250] Out: 200 [Urine:200]  PHYSICAL EXAMINATION: General:  Acutely ill appearing adult male in NAD on vent, not weaning due to neuro status HEENT: [place]/AT, PERRL, EOM-I and MMM, right sided gaze. Neuro:  Off fentanyl, propofol only, not following command or withdrawing to pain. CV: RRR, Nl S1/S2, -M/R/G. PULM: Coarse BS diffusely GI: Soft, NT, ND and +BS Extremities: -edema and -tenderness Skin: no rashes or lesions  LABS:  BMET  Recent Labs Lab 07/30/16 0340 07/30/16 1249 07/31/16 0430  NA 139 140 139  K 4.6 4.4 4.9  CL 106 109 108  CO2 16* 14* 15*  BUN 89* 90* 102*  CREATININE 5.21* 5.47* 5.90*  GLUCOSE 365* 421* 509*   Electrolytes  Recent  Labs Lab 07/28/16 1701  07/30/16 0340 07/30/16 1249 07/31/16 0430  CALCIUM  --   < > 7.9* 7.9* 8.2*  MG 2.5*  --  2.5*  --  2.4  PHOS 8.3*  --  6.2*  --  5.8*  < > = values in this interval not displayed. CBC  Recent Labs Lab 07/29/16 0435 07/30/16 0340 07/31/16 0430  WBC 13.2* 10.7* 8.1  HGB 7.8* 7.0* 6.9*  HCT 27.1* 22.7* 22.3*  PLT 401* 339 274   Coag's  Recent Labs Lab 07/05/2016 0902 07/10/2016 1018 07/26/2016 1740  APTT  --  40* 49*  INR 1.25 1.20 1.22   Sepsis Markers  Recent Labs Lab 07/09/2016 0915 07/29/2016 1038  LATICACIDVEN 2.42* 0.8   ABG  Recent Labs Lab 07/29/16 1442 07/30/16 0350 07/31/16 0412  PHART 7.377 7.365 7.347*  PCO2ART 32.8 30.5* 28.1*  PO2ART 171.0* 112* 87.7   Liver Enzymes  Recent Labs Lab 07/27/16 0419 07/28/16 0432 07/29/16 0435  AST 36 41 99*  ALT 24 22 50  ALKPHOS 99 96 86  BILITOT 0.7 1.4* 1.6*  ALBUMIN 2.2* 1.9* 1.7*   Cardiac Enzymes  Recent Labs Lab 07/21/2016 1529 07/20/2016 1943 07/26/16 0355  TROPONINI 0.46* 0.54* 0.49*   Glucose  Recent Labs Lab 07/30/16 0340 07/30/16 [id number] 07/30/16 1154 07/30/16 1641 07/30/16 2314 07/31/16 [id number]  [place]*   Imaging Mr Brain Wo Contrast  Result Date: 07/30/2016 CLINICAL DATA:  Multiple sclerosis with gaze palsy.  Recent stroke. EXAM: MRI HEAD WITHOUT CONTRAST TECHNIQUE: Multiplanar, multiecho pulse sequences of the brain and surrounding structures were obtained without intravenous contrast. COMPARISON:  Brain MRI 07/27/2016 FINDINGS: Brain: There is persistent diffusion restriction at the left parietotemporal occipital junction, with slight interval increase in ADC values. There are new foci of punctate diffusion restriction within the bilateral frontal and right parietal white matter. No acute hemorrhage. There is hyperintense T2 weighted signal at the site of the recent infarct. No mass lesion or midline shift. No hydrocephalus or extra-axial  fluid collection. The midline structures are normal. Mild generalized atrophy without lobar predominance. Vascular: Major intracranial arterial and venous sinus flow voids are preserved. No evidence of chronic microhemorrhage or amyloid angiopathy. Skull and upper cervical spine: The visualized skull base, calvarium, upper cervical spine and extracranial soft tissues are normal. Sinuses/Orbits: No fluid levels or advanced mucosal thickening. No mastoid effusion. Normal orbits. IMPRESSION: 1. Multiple new punctate foci of acute ischemia within the bilateral frontal and right parietal white matter. No associated hemorrhage or mass effect. 2. Expected evolution of now subacute infarct of the left parietotemporal occipital junction without hemorrhage or mass effect. Electronically Signed   By: Ulyses Jarred M.D.   On: 07/30/2016 22:10   Dg Chest Port 1 View  Result Date: 07/31/2016 CLINICAL DATA:  Acute onset of shortness of breath. Subsequent encounter. EXAM: PORTABLE CHEST 1 VIEW COMPARISON:  Chest radiograph performed 07/30/2016 FINDINGS: A right IJ line is noted ending about the distal SVC. The patient's enteric tube is noted extending below the diaphragm. The lungs are well-aerated. Small bilateral pleural effusions are again noted. Patchy bibasilar and left mid lung airspace opacities raise concern for multifocal pneumonia, given the appearance of the new left mid lung opacity, though superimposed pulmonary edema is a concern. There is no evidence of pneumothorax. The cardiomediastinal silhouette is borderline normal in size. No acute osseous abnormalities are seen. IMPRESSION: Small bilateral pleural effusions again noted. Patchy bibasilar and left midlung airspace opacities are mildly redistributed since the prior study, concerning for multifocal pneumonia, though superimposed pulmonary edema is a concern. Electronically Signed   By: Garald Balding M.D.   On: 07/31/2016 06:36   STUDIES:  CT head 2/22 >>no  bleed Left heart cath 2/22 >> cad min 40% Echo 2/22 >> LV wnl, R Vnormal EEG 2/22 no focus, global suppression MRI 2/24 >> acute infarct centered in the junction of left parietal, occipital & temporal lobes, background mild chronic microvascular ischemic changes, chronic lacunar infarcts in the right cerebellar hemisphere & right frontal periventricular white matter  CULTURES: None  ANTIBIOTICS: Zosyn 2/23>>>  SIGNIFICANT EVENTS: 2/22 admitted s/p VF arrest. Time to ROSC estimated at 7 minutes. Rushed to cardiac cath lab.  2/24 MRI concerning for acute infarct > neuro recommended no acute interventions   LINES/TUBES: OETT 2/22 >> R IJ 2/22 >> R rad aline 2/22 >>  DISCUSSION: 66 year old male admitted s/p VF arrest. Time to ROSC 7 minutes.  To cath lab with minimal disease.  Hypothermia protocol.  Rewarmed.  MRI with acute infarct in   ASSESSMENT / PLAN:  PULMONARY A: Cardiopulmonary arrest w/ resultant acute respiratory failure  Hypoxia / Desaturations - noted 2/24 Mild interstitial edema / Pleural Effusions P:   Continue vent support given neuro status Today is day 7 of intubation, if  son continues to wish for full support then will need to proceed with trach/peg which wife and daughter do not wish for.  Expect decision by Friday per discussion between myself, neurology and wife. PRVC 8 cc/kg, rate 35 Follow ABG Trend PCXR  CARDIOVASCULAR A:  VF arrest - unclear etiology LV fxn wnl RV wnl, unlikley PE Syncope associayed with VF HTN event 2/22 resolved after foley irrigated P:  Tele monitoring  DNR per discussion below No further escalation of care, no pressors.  RENAL A:   Hypomagnesemia  Mild lactic acidosis s/p cardiac arrest Hyperkalemia - suspect in setting of acidosis Cr much worse P:   Trend BMP. KVO IVF. Replace electrolytes as indicated  GASTROINTESTINAL A:   Protein Calorie Malnutrition   P:   NPO  TF per nutrition  PPI for SUP    HEMATOLOGIC A:   Anemia (h/o FESO4 def) P:  Trend CBC  Heparin SQ   INFECTIOUS A:   Febrile overnight BP dropping P:   Normothermia goal complete Monitor fever curve / wbc No escalation of abx No pressors  ENDOCRINE A:   DM w/ Hyperglycemia  P:   SSI  Lantus 10 units  NEUROLOGIC A:   Acute encephalopathy s/p arrest - initial CT head negative for bleed. Some chronic grey matter disease  S/p fall CVA - left temporal, parietal and occipital involvement Remote CVA R/o [place] as cause arrest P:   RASS goal: 0 to -1  Propofol drip and off fentanyl C-collar until can be cleared clinically  Frequent neuro exams  FAMILY  - Updates:  Spoke with wife as above.  DNR.  She is to speak with her son (only hold out at this point) and inform that ETT needs to be out by Friday to trach/peg or to comfort is their decision.  Wife is to inform RN to let me know.  - Inter-disciplinary family meet or Palliative Care meeting due by: completed 2/25  The patient is critically ill with multiple organ systems failure and requires high complexity decision making for assessment and support, frequent evaluation and titration of therapies, application of advanced monitoring technologies and extensive interpretation of multiple databases.   Critical Care Time devoted to patient care services described in this note is  35  Minutes. This time reflects time of care of this signee Dr Jennet Maduro. This critical care time does not reflect procedure time, or teaching time or supervisory time of PA/NP/Med student/Med Resident etc but could involve care discussion time.  Rush Farmer, M.D. [facility] Pulmonary/Critical Care Medicine. Pager: [phone]. After hours pager: [phone].  07/31/2016, 11:01 AM
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
## 2016-08-01 ENCOUNTER — Inpatient Hospital Stay (HOSPITAL_COMMUNITY): Payer: Medicare Other

## 2016-08-01 LAB — BLOOD GAS, ARTERIAL
Acid-base deficit: 7.7 mmol/L — ABNORMAL HIGH (ref 0.0–2.0)
BICARBONATE: 16.7 mmol/L — AB (ref 20.0–28.0)
Drawn by: 31101
FIO2: 30
LHR: 35 {breaths}/min
O2 SAT: 99.4 %
PATIENT TEMPERATURE: 98.6
PCO2 ART: 30.4 mmHg — AB (ref 32.0–48.0)
PEEP/CPAP: 10 cmH2O
PH ART: 7.358 (ref 7.350–7.450)
PO2 ART: 162 mmHg — AB (ref 83.0–108.0)
VT: 530 mL

## 2016-08-01 LAB — CBC
HEMATOCRIT: 22.9 % — AB (ref 39.0–52.0)
Hemoglobin: 7 g/dL — ABNORMAL LOW (ref 13.0–17.0)
MCH: 24.1 pg — AB (ref 26.0–34.0)
MCHC: 30.6 g/dL (ref 30.0–36.0)
MCV: 78.7 fL (ref 78.0–100.0)
PLATELETS: 256 10*3/uL (ref 150–400)
RBC: 2.91 MIL/uL — ABNORMAL LOW (ref 4.22–5.81)
RDW: 18.3 % — AB (ref 11.5–15.5)
WBC: 10.1 10*3/uL (ref 4.0–10.5)

## 2016-08-01 LAB — BASIC METABOLIC PANEL
ANION GAP: 12 (ref 5–15)
BUN: 106 mg/dL — ABNORMAL HIGH (ref 6–20)
CALCIUM: 8.4 mg/dL — AB (ref 8.9–10.3)
CO2: 18 mmol/L — AB (ref 22–32)
Chloride: 116 mmol/L — ABNORMAL HIGH (ref 101–111)
Creatinine, Ser: 6.22 mg/dL — ABNORMAL HIGH (ref 0.61–1.24)
GFR calc Af Amer: 10 mL/min — ABNORMAL LOW (ref >60)
GFR, EST NON AFRICAN AMERICAN: 8 mL/min — AB (ref >60)
GLUCOSE: 133 mg/dL — AB (ref 65–99)
POTASSIUM: 4.4 mmol/L (ref 3.5–5.1)
Sodium: 146 mmol/L — ABNORMAL HIGH (ref 135–145)

## 2016-08-01 LAB — GLUCOSE, CAPILLARY
GLUCOSE-CAPILLARY: 188 mg/dL — AB (ref 65–99)
GLUCOSE-CAPILLARY: 197 mg/dL — AB (ref 65–99)
GLUCOSE-CAPILLARY: 173 mg/dL — AB (ref 65–99)
GLUCOSE-CAPILLARY: 125 mg/dL — AB (ref 65–99)
GLUCOSE-CAPILLARY: 156 mg/dL — AB (ref 65–99)
GLUCOSE-CAPILLARY: 167 mg/dL — AB (ref 65–99)
Glucose-Capillary: 184 mg/dL — ABNORMAL HIGH (ref 65–99)
Glucose-Capillary: 197 mg/dL — ABNORMAL HIGH (ref 65–99)
Glucose-Capillary: 148 mg/dL — ABNORMAL HIGH (ref 65–99)
Glucose-Capillary: 123 mg/dL — ABNORMAL HIGH (ref 65–99)
Glucose-Capillary: 143 mg/dL — ABNORMAL HIGH (ref 65–99)
Glucose-Capillary: 151 mg/dL — ABNORMAL HIGH (ref 65–99)

## 2016-08-01 LAB — MAGNESIUM: Magnesium: 2.5 mg/dL — ABNORMAL HIGH (ref 1.7–2.4)

## 2016-08-01 LAB — PHOSPHORUS: Phosphorus: 4.8 mg/dL — ABNORMAL HIGH (ref 2.5–4.6)

## 2016-08-01 MED ORDER — INSULIN GLARGINE 100 UNIT/ML ~~LOC~~ SOLN
40.0000 [IU] | SUBCUTANEOUS | Status: DC
  Administered 2016-08-01: 40 [IU] via SUBCUTANEOUS
  Filled 2016-08-01 (×2): qty 0.4

## 2016-08-01 MED ORDER — FUROSEMIDE 10 MG/ML IJ SOLN
40.0000 mg | Freq: Four times a day (QID) | INTRAMUSCULAR | Status: AC
  Administered 2016-08-01 – 2016-08-02 (×3): 40 mg via INTRAVENOUS
  Filled 2016-08-01 (×3): qty 4

## 2016-08-01 MED ORDER — INSULIN ASPART 100 UNIT/ML ~~LOC~~ SOLN
7.0000 [IU] | SUBCUTANEOUS | Status: DC
  Administered 2016-08-01 – 2016-08-02 (×5): 7 [IU] via SUBCUTANEOUS

## 2016-08-01 MED ORDER — INSULIN ASPART 100 UNIT/ML ~~LOC~~ SOLN
1.0000 [IU] | SUBCUTANEOUS | Status: DC
  Administered 2016-08-01 (×2): 2 [IU] via SUBCUTANEOUS
  Administered 2016-08-02 (×3): 3 [IU] via SUBCUTANEOUS

## 2016-08-01 MED ORDER — DEXTROSE 10 % IV SOLN: INTRAVENOUS | Status: DC | PRN

## 2016-08-01 NOTE — Progress Notes (Signed)
Patient's wife told RN that she "wants to make her husband comfortable" and to let the doctor know.  Dr. Nelda Marseille notified. [place], Ardeth Sportsman

## 2016-08-01 NOTE — Progress Notes (Signed)
Pt increasingly restless, audible gurgling sound coming from ETT, O2 sats 92% and biting ETT.  RN noticed ETT tube at 22 cm at lips and had been at 26 cm.  RT notified and she advanced tube.  Patient calm and VSS. [place], Ardeth Sportsman

## 2016-08-01 NOTE — Progress Notes (Signed)
PULMONARY / CRITICAL CARE MEDICINE   Name: [name] MRN: [id number] DOB: Nov 01, 1950    ADMISSION DATE:  07/21/2016  REFERRING MD:  Tegeler   CHIEF COMPLAINT:  VF arrest  HISTORY OF PRESENT ILLNESS:  66 year old male w/ h/o HTN, DM type II and prior CVA. Also remote h/o hodgkin's lymphoma. Found down in bathroom after a fall. CPR initiated by spouse. Initial rhythm was VF on EMS arrival. CPR continued, shocked x1. Time to ROSC estimated at 7 minutes. Pt unresponsive on arrival w/ report of possible localization to noxious stim, cough and later decerebrate posturing. Placed in C collar, CT head negative, rushed to cardiac cath lab PCCM to admit. Cath neg with minimal CAD > hypothermia protocol initiated.   SUBJECTIVE:   No events overnight  VITAL SIGNS: BP (!) 109/57   Pulse 82   Temp 98.5 F (36.9 C) (Oral)   Resp (!) 35   Ht 5\' 5"  (1.651 m)   Wt 77.6 kg (171 lb 1.2 oz)   SpO2 97%   BMI 28.47 kg/m   HEMODYNAMICS:    VENTILATOR SETTINGS: Vent Mode: PRVC FiO2 (%):  [30 %-35 %] 30 % Set Rate:  [35 bmp] 35 bmp Vt Set:  [530 mL] 530 mL PEEP:  [10 cmH20] 10 cmH20 Plateau Pressure:  [25 cmH20-30 cmH20] 29 cmH20  INTAKE / OUTPUT: I/O last 3 completed shifts: In: 5379.7 [I.V.:2749.8; NG/GT:2379.9; IV Piggyback:250] Out: 445 [Urine:445]  PHYSICAL EXAMINATION: General:  Acutely ill appearing adult male in NAD on vent, not weaning due to neuro status HEENT: [place]/AT, PERRL, EOM-I and MMM, right sided gaze. Neuro:  Off fentanyl, propofol only, not following command or withdrawing to pain. CV: RRR, Nl S1/S2, -M/R/G. PULM: Coarse BS diffusely GI: Soft, NT, ND and +BS Extremities: -edema and -tenderness Skin: no rashes or lesions  LABS:  BMET  Recent Labs Lab 07/30/16 1249 07/31/16 0430 08/01/16 0454  NA 140 139 146*  K 4.4 4.9 4.4  CL 109 108 116*  CO2 14* 15* 18*  BUN 90* 102* 106*  CREATININE 5.47* 5.90* 6.22*  GLUCOSE 421* 509* 133*   Electrolytes  Recent  Labs Lab 07/30/16 0340 07/30/16 1249 07/31/16 0430 08/01/16 0454  CALCIUM 7.9* 7.9* 8.2* 8.4*  MG 2.5*  --  2.4 2.5*  PHOS 6.2*  --  5.8* 4.8*   CBC  Recent Labs Lab 07/30/16 0340 07/31/16 0430 08/01/16 0454  WBC 10.7* 8.1 10.1  HGB 7.0* 6.9* 7.0*  HCT 22.7* 22.3* 22.9*  PLT 339 274 256   Coag's  Recent Labs Lab 07/13/2016 1018 07/08/2016 1740  APTT 40* 49*  INR 1.20 1.22   Sepsis Markers  Recent Labs Lab 07/10/2016 1038  LATICACIDVEN 0.8   ABG  Recent Labs Lab 07/30/16 0350 07/31/16 0412 08/01/16 0511  PHART 7.365 7.347* 7.358  PCO2ART 30.5* 28.1* 30.4*  PO2ART 112* 87.7 162*   Liver Enzymes  Recent Labs Lab 07/27/16 0419 07/28/16 0432 07/29/16 0435  AST 36 41 99*  ALT 24 22 50  ALKPHOS 99 96 86  BILITOT 0.7 1.4* 1.6*  ALBUMIN 2.2* 1.9* 1.7*   Cardiac Enzymes  Recent Labs Lab 07/16/2016 1529 07/13/2016 1943 07/26/16 0355  TROPONINI 0.46* 0.54* 0.49*   Glucose  Recent Labs Lab 07/30/16 1641 07/30/16 2314 07/31/16 0733 08/01/16 0646 08/01/16 0748 08/01/16 0847  GLUCAP 384* 375* 452* 184* 197* 188*   Imaging Dg Chest Port 1 View  Result Date: 08/01/2016 CLINICAL DATA:  Intubation . EXAM: PORTABLE CHEST  1 VIEW COMPARISON:  07/31/2016 . FINDINGS: Right IJ line, NG tube in stable position. Endotracheal tube tip noted at the thoracic inlet. Heart size stable . Persistent bilateral pulmonary infiltrates and or edema noted with slight clearing. Prominent left base atelectasis. Small left pleural effusion. No pneumothorax. IMPRESSION: 1. Lines and tubes in stable position. Endotracheal tube tip noted at the thoracic inlet. 2. Persistent bilateral pulmonary infiltrates and/or edema with slight clearing. Left base atelectasis. Small left pleural effusion. Electronically Signed   By: Marcello Moores  Register   On: 08/01/2016 06:32   STUDIES:  CT head 2/22 >>no bleed Left heart cath 2/22 >> cad min 40% Echo 2/22 >> LV wnl, R Vnormal EEG 2/22 no focus,  global suppression MRI 2/24 >> acute infarct centered in the junction of left parietal, occipital & temporal lobes, background mild chronic microvascular ischemic changes, chronic lacunar infarcts in the right cerebellar hemisphere & right frontal periventricular white matter  CULTURES: None  ANTIBIOTICS: Zosyn 2/23>>>  SIGNIFICANT EVENTS: 2/22 admitted s/p VF arrest. Time to ROSC estimated at 7 minutes. Rushed to cardiac cath lab.  2/24 MRI concerning for acute infarct > neuro recommended no acute interventions   LINES/TUBES: OETT 2/22 >> R IJ 2/22 >> R rad aline 2/22 >>  DISCUSSION: 66 year old male admitted s/p VF arrest. Time to ROSC 7 minutes.  To cath lab with minimal disease.  Hypothermia protocol.  Rewarmed.  MRI with acute infarct in   ASSESSMENT / PLAN:  PULMONARY A: Cardiopulmonary arrest w/ resultant acute respiratory failure  Hypoxia / Desaturations - noted 2/24 Mild interstitial edema / Pleural Effusions P:   Continue vent support given neuro status Today is day 8 of intubation, need additional communication with family PRVC 8 cc/kg, rate 35 Follow ABG Trend PCXR Decrease PEEP to 5  CARDIOVASCULAR A:  VF arrest - unclear etiology LV fxn wnl RV wnl, unlikley PE Syncope associayed with VF HTN event 2/22 resolved after foley irrigated P:  Tele monitoring  DNR No further escalation of care, no pressors.  RENAL A:   Hypomagnesemia  Mild lactic acidosis s/p cardiac arrest Hyperkalemia - suspect in setting of acidosis Cr much worse P:   Trend BMP. KVO IVF. Replace electrolytes as indicated  GASTROINTESTINAL A:   Protein Calorie Malnutrition   P:   NPO  TF per nutrition  PPI for SUP   HEMATOLOGIC A:   Anemia (h/o FESO4 def) P:  Trend CBC  Heparin SQ   INFECTIOUS A:   Febrile overnight BP dropping P:   Normothermia goal complete Monitor fever curve / wbc No escalation of abx No pressors  ENDOCRINE A:   DM w/ Hyperglycemia  P:    SSI  Lantus 10 units  NEUROLOGIC A:   Acute encephalopathy s/p arrest - initial CT head negative for bleed. Some chronic grey matter disease  S/p fall CVA - left temporal, parietal and occipital involvement Remote CVA R/o [place] as cause arrest P:   RASS goal: 0 to -1  Propofol drip and off fentanyl C-collar until can be cleared clinically  Frequent neuro exams  FAMILY  - Updates:  No family bedside today  - Inter-disciplinary family meet or Palliative Care meeting due by: completed 2/25  The patient is critically ill with multiple organ systems failure and requires high complexity decision making for assessment and support, frequent evaluation and titration of therapies, application of advanced monitoring technologies and extensive interpretation of multiple databases.   Critical Care Time devoted to  patient care services described in this note is  35  Minutes. This time reflects time of care of this signee Dr Jennet Maduro. This critical care time does not reflect procedure time, or teaching time or supervisory time of PA/NP/Med student/Med Resident etc but could involve care discussion time.  Rush Farmer, M.D. [facility] Pulmonary/Critical Care Medicine. Pager: [phone]. After hours pager: [phone].  08/01/2016, 9:48 AM

## 2016-08-01 NOTE — Progress Notes (Signed)
E-Link MD notified of patient's red-colored urine. [name], [name]

## 2016-08-01 NOTE — Progress Notes (Signed)
Subjective: Following commands this morning.   Exam: Vitals:   08/01/16 1300 08/01/16 1400  BP: 140/73 (!) 145/65  Pulse: 83 85  Resp: (!) 35 (!) 35  Temp:     Gen: In bed, NAD Resp: non-labored breathing, no acute distress Abd: soft, nt  Neuro: MS: opens eyes to name. Follows commands to stick out tongue and wiggle toes on right.  CN: pupils reactive, corneals intact.  Motor: moves left to noxious stimuli, he has no movement in right UE, wiggles toes to command ion the right. Sensory:as above.   Impression: 66 yo M s/p cardiac arrest with post-arrest encephalopathy. He does have some right sided weakness and rightward gaze deviation which I would not typically expect from the stroke that he had. I would favor repeating this to look for new lesions.   I think that the likelihood of recovery to independent function is relatively low given the right hemiparesis. But with him beginning to follow commands, there may be some chance of some neurological recovery. Unfortunately, from a medical perspective he continues to do poorly.   The infarct appears cardioembolic, but given size of left parietal injury, I am not sure that anticoagulation would be safe at this time, and with no clear indication for anticoagulation, would not pursue this.   Recommendations: 1) Continue supportive care 2) could consider continued aggressive care if family desires.  Roland Rack, MD [facility] [phone]  If 7pm- 7am, please page neurology on call as listed in [place].

## 2016-08-01 DEATH — deceased

## 2016-08-02 DIAGNOSIS — Z515 Encounter for palliative care: Secondary | ICD-10-CM

## 2016-08-02 LAB — GLUCOSE, CAPILLARY
GLUCOSE-CAPILLARY: 133 mg/dL — AB (ref 65–99)
GLUCOSE-CAPILLARY: 136 mg/dL — AB (ref 65–99)
GLUCOSE-CAPILLARY: 155 mg/dL — AB (ref 65–99)
GLUCOSE-CAPILLARY: 158 mg/dL — AB (ref 65–99)
GLUCOSE-CAPILLARY: 257 mg/dL — AB (ref 65–99)
GLUCOSE-CAPILLARY: 270 mg/dL — AB (ref 65–99)
GLUCOSE-CAPILLARY: 327 mg/dL — AB (ref 65–99)
GLUCOSE-CAPILLARY: 404 mg/dL — AB (ref 65–99)
Glucose-Capillary: 122 mg/dL — ABNORMAL HIGH (ref 65–99)
Glucose-Capillary: 134 mg/dL — ABNORMAL HIGH (ref 65–99)
Glucose-Capillary: 136 mg/dL — ABNORMAL HIGH (ref 65–99)
Glucose-Capillary: 142 mg/dL — ABNORMAL HIGH (ref 65–99)
Glucose-Capillary: 156 mg/dL — ABNORMAL HIGH (ref 65–99)
Glucose-Capillary: 157 mg/dL — ABNORMAL HIGH (ref 65–99)
Glucose-Capillary: 161 mg/dL — ABNORMAL HIGH (ref 65–99)
Glucose-Capillary: 190 mg/dL — ABNORMAL HIGH (ref 65–99)
Glucose-Capillary: 216 mg/dL — ABNORMAL HIGH (ref 65–99)
Glucose-Capillary: 248 mg/dL — ABNORMAL HIGH (ref 65–99)
Glucose-Capillary: 254 mg/dL — ABNORMAL HIGH (ref 65–99)
Glucose-Capillary: 264 mg/dL — ABNORMAL HIGH (ref 65–99)
Glucose-Capillary: 297 mg/dL — ABNORMAL HIGH (ref 65–99)
Glucose-Capillary: 430 mg/dL — ABNORMAL HIGH (ref 65–99)
Glucose-Capillary: 449 mg/dL — ABNORMAL HIGH (ref 65–99)
Glucose-Capillary: 459 mg/dL — ABNORMAL HIGH (ref 65–99)

## 2016-08-02 MED ORDER — SODIUM CHLORIDE 0.9 % IV SOLN
30.0000 mg/h | INTRAVENOUS | Status: DC
  Administered 2016-08-02: 10 mg/h via INTRAVENOUS
  Administered 2016-08-02 – 2016-08-03 (×2): 30 mg/h via INTRAVENOUS
  Filled 2016-08-02 (×3): qty 10

## 2016-08-02 MED ORDER — MORPHINE BOLUS VIA INFUSION
5.0000 mg | INTRAVENOUS | Status: DC | PRN
  Filled 2016-08-02: qty 20

## 2016-08-02 MED ORDER — FENTANYL 2500MCG IN NS 250ML (10MCG/ML) PREMIX INFUSION: 25.0000 ug/h | INTRAVENOUS | Status: DC

## 2016-08-02 NOTE — Progress Notes (Signed)
Nutrition Brief Note  Chart reviewed. Pt now transitioning to comfort care.  No further nutrition interventions warranted at this time.  Please re-consult as needed.   Yasiel Goyne RD, LDN, CNSC [phone] Pager [phone] After Hours Pager    

## 2016-08-02 NOTE — Progress Notes (Signed)
Elink MD notified of patient's blood sugar being >250 X2.  RN instructed to give ordered insulin and not resume hyperglycemia protocol since the plan is to withdraw today.  RN will continue to monitor patient.

## 2016-08-02 NOTE — Progress Notes (Signed)
Wasted 200 ML of fentanyl in sink with Katharine Look, Therapist, sports.   Shelba Flake, RN

## 2016-08-02 NOTE — Progress Notes (Signed)
PULMONARY / CRITICAL CARE MEDICINE   Name: [name] MRN: [id number] DOB: [name] 27, 1951    ADMISSION DATE:  07/22/2016  REFERRING MD:  Tegeler   CHIEF COMPLAINT:  VF arrest  HISTORY OF PRESENT ILLNESS:  66 year old male w/ h/o HTN, DM type II and prior CVA. Also remote h/o hodgkin's lymphoma. Found down in bathroom after a fall. CPR initiated by spouse. Initial rhythm was VF on EMS arrival. CPR continued, shocked x1. Time to ROSC estimated at 7 minutes. Pt unresponsive on arrival w/ report of possible localization to noxious stim, cough and later decerebrate posturing. Placed in C collar, CT head negative, rushed to cardiac cath lab PCCM to admit. Cath neg with minimal CAD > hypothermia protocol initiated.   SUBJECTIVE:   No events overnight  VITAL SIGNS: BP (!) 153/73 (BP Location: Right Arm)   Pulse 84   Temp 98.4 F (36.9 C) (Oral)   Resp (!) 35   Ht 5\' 5"  (1.651 m)   Wt 76.1 kg (167 lb 12.3 oz)   SpO2 96%   BMI 27.92 kg/m   HEMODYNAMICS:    VENTILATOR SETTINGS: Vent Mode: PRVC FiO2 (%):  [30 %] 30 % Set Rate:  [35 bmp] 35 bmp Vt Set:  [530 mL] 530 mL PEEP:  [5 cmH20] 5 cmH20 Plateau Pressure:  [19 cmH20-24 cmH20] 21 cmH20  INTAKE / OUTPUT: I/O last 3 completed shifts: In: 5672.8 [I.V.:2942.8; Other:50; NG/GT:2429.9; IV Piggyback:250] Out: 3250 [Urine:3250]  PHYSICAL EXAMINATION: General:  Acutely ill appearing adult male in NAD on vent, not weaning due to neuro status HEENT: [place]/AT, PERRL, EOM-I and MMM, right sided gaze. Neuro:  Off fentanyl, propofol only, not following command or withdrawing to pain. CV: RRR, Nl S1/S2, -M/R/G. PULM: Coarse BS diffusely GI: Soft, NT, ND and +BS Extremities: -edema and -tenderness Skin: no rashes or lesions  LABS:  BMET  Recent Labs Lab 07/30/16 1249 07/31/16 0430 08/01/16 0454  NA 140 139 146*  K 4.4 4.9 4.4  CL 109 108 116*  CO2 14* 15* 18*  BUN 90* 102* 106*  CREATININE 5.47* 5.90* 6.22*  GLUCOSE 421* 509*  133*   Electrolytes  Recent Labs Lab 07/30/16 0340 07/30/16 1249 07/31/16 0430 08/01/16 0454  CALCIUM 7.9* 7.9* 8.2* 8.4*  MG 2.5*  --  2.4 2.5*  PHOS 6.2*  --  5.8* 4.8*   CBC  Recent Labs Lab 07/30/16 0340 07/31/16 0430 08/01/16 0454  WBC 10.7* 8.1 10.1  HGB 7.0* 6.9* 7.0*  HCT 22.7* 22.3* 22.9*  PLT 339 274 256   Coag's No results for input(s): APTT, INR in the last 168 hours. Sepsis Markers No results for input(s): LATICACIDVEN, PROCALCITON, O2SATVEN in the last 168 hours. ABG  Recent Labs Lab 07/30/16 0350 07/31/16 0412 08/01/16 0511  PHART 7.365 7.347* 7.358  PCO2ART 30.5* 28.1* 30.4*  PO2ART 112* 87.7 162*   Liver Enzymes  Recent Labs Lab 07/27/16 0419 07/28/16 0432 07/29/16 0435  AST 36 41 99*  ALT 24 22 50  ALKPHOS 99 96 86  BILITOT 0.7 1.4* 1.6*  ALBUMIN 2.2* 1.9* 1.7*   Cardiac Enzymes No results for input(s): TROPONINI, PROBNP in the last 168 hours. Glucose  Recent Labs Lab 08/01/16 1652 08/01/16 1957 08/02/16 0013 08/02/16 0253 08/02/16 0422 08/02/16 0745  GLUCAP 156* 167* 254* 264* 270* 216*   Imaging No results found. STUDIES:  CT head 2/22 >>no bleed Left heart cath 2/22 >> cad min 40% Echo 2/22 >> LV wnl, R Vnormal  EEG 2/22 no focus, global suppression MRI 2/24 >> acute infarct centered in the junction of left parietal, occipital & temporal lobes, background mild chronic microvascular ischemic changes, chronic lacunar infarcts in the right cerebellar hemisphere & right frontal periventricular white matter  CULTURES: None  ANTIBIOTICS: Zosyn 2/23>>>  SIGNIFICANT EVENTS: 2/22 admitted s/p VF arrest. Time to ROSC estimated at 7 minutes. Rushed to cardiac cath lab.  2/24 MRI concerning for acute infarct > neuro recommended no acute interventions   LINES/TUBES: OETT 2/22 >> R IJ 2/22 >> R rad aline 2/22 >>  DISCUSSION: 66 year old male admitted s/p VF arrest. Time to ROSC 7 minutes.  To cath lab with minimal  disease.  Hypothermia protocol.  Rewarmed.  MRI with acute infarct in   ASSESSMENT / PLAN:  PULMONARY A: Cardiopulmonary arrest w/ resultant acute respiratory failure  Hypoxia / Desaturations - noted 2/24 Mild interstitial edema / Pleural Effusions P:   Extubate Titrate O2 for comfort Full comfort care.  CARDIOVASCULAR A:  VF arrest - unclear etiology LV fxn wnl RV wnl, unlikley PE Syncope associayed with VF HTN event 2/22 resolved after foley irrigated P:  D/C tele monitoring  RENAL A:   Hypomagnesemia  Mild lactic acidosis s/p cardiac arrest Hyperkalemia - suspect in setting of acidosis Cr much worse P:   D/C further blood draws.  GASTROINTESTINAL A:   Protein Calorie Malnutrition   P:   D/C TF  HEMATOLOGIC A:   Anemia (h/o FESO4 def) P:  D/C CBC  INFECTIOUS A:   Febrile overnight BP dropping P:   D/C abx  ENDOCRINE A:   DM w/ Hyperglycemia  P:   D/C CBGs  NEUROLOGIC A:   Acute encephalopathy s/p arrest - initial CT head negative for bleed. Some chronic grey matter disease  S/p fall CVA - left temporal, parietal and occipital involvement Remote CVA R/o [place] as cause arrest P:   Morphine for comfort  FAMILY  - Updates:  Family updated at length bedside, they do not wish to see patient suffering any further.  He evidently has been clear that this kind of life is not what he would want.  Neurology feels that patient has a chance at some recovery but not full recovery (hemi-paresis...etc.) and wife and daughter feel that patient would not want to live that way.  Son per mother is in disagreement but he was not present for this family meeting.  They family clearly feels that unless patient can go back to normal he would not want to be alive.  Given the fact that patient will not go back to normal from a neurologic standpoint I feel comfortable from an ethical and moral standpoint that withdraw is the right course of action.  I offered the wife to  see if patient can breath some to be able to speak to him but wife would just like for patient to be comfortable.  Will start morphine and terminally extubate patient today.  Will make a full DNR.  - Inter-disciplinary family meet or Palliative Care meeting due by: completed 2/25  The patient is critically ill with multiple organ systems failure and requires high complexity decision making for assessment and support, frequent evaluation and titration of therapies, application of advanced monitoring technologies and extensive interpretation of multiple databases.   Critical Care Time devoted to patient care services described in this note is  35  Minutes. This time reflects time of care of this signee Dr Jennet Maduro. This critical  care time does not reflect procedure time, or teaching time or supervisory time of PA/NP/Med student/Med Resident etc but could involve care discussion time.  Rush Farmer, M.D. [facility] Pulmonary/Critical Care Medicine. Pager: [phone]. After hours pager: [phone].  08/02/2016, 11:24 AM

## 2016-08-02 NOTE — Progress Notes (Signed)
Pt extubated per withdrawal of life sustaining treatment orders. Pt is comfortable on morphine gtt. No distress noted.

## 2016-08-02 NOTE — Progress Notes (Signed)
Pharmacy Antibiotic Note  [name] is a 66 y.o. male admitted on 07/30/2016 with leukocytosis s/p cardiac arrest and sinusitis on CT head.  Pharmacy has been consulted for zosyn dosing.  Plan: Zosyn adjusted for renal dysfunction to 2.25g IV q 8 hrs. Noted plans for likely withdrawal of care soon.  Height: 5\' 5"  (165.1 cm) Weight: 167 lb 12.3 oz (76.1 kg) IBW/kg (Calculated) : 61.5  Temp (24hrs), Avg:98.6 F (37 C), Min:98 F (36.7 C), Max:98.9 F (37.2 C)   Recent Labs Lab 07/28/16 0432  07/29/16 0435 07/30/16 0340 07/30/16 1249 07/31/16 0430 08/01/16 0454  WBC 21.4*  --  13.2* 10.7*  --  8.1 10.1  CREATININE 2.21*  < > 3.83* 5.21* 5.47* 5.90* 6.22*  < > = values in this interval not displayed.  Estimated Creatinine Clearance: 10.8 mL/min (by C-G formula based on SCr of 6.22 mg/dL (H)).    Allergies  Allergen Reactions  . Codeine Phosphate Hives and Other (See Comments)    Itching and jittery  . Tomato Other (See Comments)    Heartburn     Antimicrobials this admission: 2/24 Zosyn >>   Dose adjustments this admission: NA  Microbiology results:  2/22 MRSA PCR: neg  Thank you for allowing pharmacy to be a part of this patient's care.  Uvaldo Rising, BCPS  Clinical Pharmacist Pager [phone]  08/02/2016 9:57 AM

## 2016-08-02 NOTE — Progress Notes (Signed)
Responded to consult for end of life, but family was already gone. Nurse is unsure if they will be back, as wife said, How many times can I say goodbye? when leaving. Offered silent prayer for pt. Will pass on referral for f/u w/ next chaplain.   08/02/16 1600  Clinical Encounter Type  Visited With Patient not available  Visit Type Initial;Psychological support;Spiritual support;Social support;Critical Care  Referral From Nurse   Gerrit Heck, Chaplain

## 2016-08-06 ENCOUNTER — Telehealth: Payer: Self-pay

## 2016-08-06 NOTE — Telephone Encounter (Signed)
On 08/06/16 I received a death certificate from [place] (original). The death certificate is for burial. The patient is a patient of Doctor Nelda Marseille. The death certificate will be taken to [facility] [id number] [address] [place]) for signature.  On 18-Aug-2016 I received the death certificate back from Doctor Nelda Marseille. I got the death certificate ready and called the funeral home to let them know the death certificate is ready for pickup.

## 2016-09-01 NOTE — Plan of Care (Signed)
Problem: Education: Goal: Knowledge of [facility] Education information/materials will improve Outcome: Not Met (add Reason) Pt on full comfort measures.

## 2016-09-01 NOTE — Discharge Summary (Signed)
NAME:  [name], [name]                     ACCOUNT NO.:  MEDICAL RECORD NO.:  [id number]  LOCATION:                                 FACILITY:  PHYSICIAN:  Providence Lanius, MD  DATE OF BIRTH:  05-23-1951  DATE OF ADMISSION: DATE OF DISCHARGE:                              DISCHARGE SUMMARY   DEATH SUMMARY  PRIMARY DIAGNOSIS/CAUSE OF DEATH:  Ventricular fibrillation cardiac arrest.  SECONDARY DIAGNOSES:  Diabetes, hypertension, acute respiratory failure with hypoxemia, interstitial edema, hypomagnesemia, lactic acidosis, hyperkalemia, acute renal failure, anemia, protein-calorie malnutrition, type 2 diabetes, anoxic encephalopathy.  The patient is a 66 year old male with past medical history significant for diabetes, hypertension, prior CVAs, who was found down in bathroom after full.  CPR was initiated by spouse, initial rhythm upon arrival of EMS was VFib.  CPR was continued, shock x1.  Return of spontaneous circulation was achieved after 7 minutes.  The patient was brought to the emergency department where he did not suffer any further cardiac arrest at that point.  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  The wife and daughter felt very strongly that the patient would not want that level of debility and he had multiple occasions from that he would never want to be in the nursing home.  At  which point, I spoke with the son who was in disagreement 2 days prior, but stopped coming to the family meetings.  I reiterated to given Neurology's recommendation for them to state the patient's wishes to me and they clearly state that unless he can go back to normal as he was prior to the rest, he would not want that level of care as the following MRI showed significant stroke.  Given the fact that these are the patient's wishes, I agreed that comfort care would be the appropriate course of action, at which point, morphine started.  The patient was extubated to expire shortly thereafter.     Providence Lanius, MD     WJY/MEDQ  D:  08/13/2016  T:  08/14/2016  Job:  [id number]

## 2016-09-01 NOTE — Progress Notes (Signed)
Pt passed at 0705, 2 RN's listened for heart and lung sounds for full minute, none auscultated. Family and MD notified.

## 2016-09-01 DEATH — deceased

## 2016-09-09 ENCOUNTER — Ambulatory Visit: Payer: Medicare Other | Admitting: Oncology

## 2016-09-09 ENCOUNTER — Other Ambulatory Visit: Payer: Medicare Other

## 2016-10-04 ENCOUNTER — Ambulatory Visit: Payer: Medicare Other | Admitting: Internal Medicine

## 2016-11-13 NOTE — Telephone Encounter (Signed)
error 

## 2018-08-05 IMAGING — CT CT ABD-PELV W/ CM
3 of 5 series · 12 of 36 positions shown, 18 images · IV contrast (READICAT/WATER & [ID] ISOVUE 300)
Comparison: None.

CLINICAL DATA: Epigastric pain for 6 months. 10 pound weight loss.
History of Hodgkin's disease in 9000 status post chemotherapy.

EXAM:
CT ABDOMEN AND PELVIS WITH CONTRAST
TECHNIQUE: Multidetector CT imaging of the abdomen and pelvis was performed
using the standard protocol following bolus administration of
intravenous contrast.
CONTRAST:  100 cc Isovue 300 intravenous

[Series 3: abd/pelvis with · axial · 0.70mm/px · z∈[-308,+6]mm · 8 of 81 slices shown, 13 images]
[im 9/81  soft-tissue]
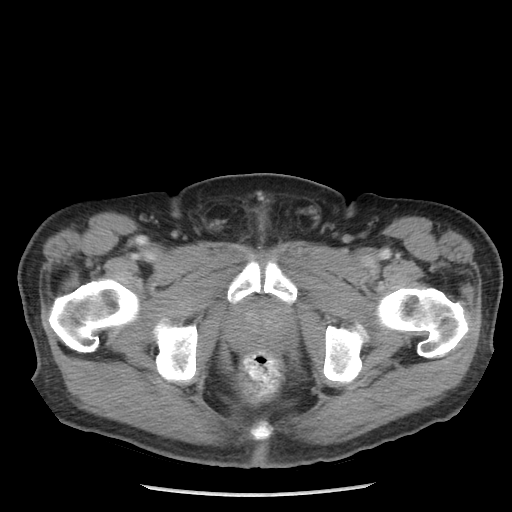
[im 9/81  bone]
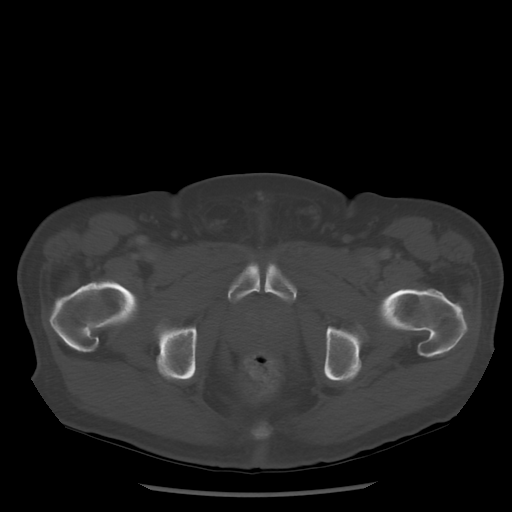
[im 18/81  soft-tissue]
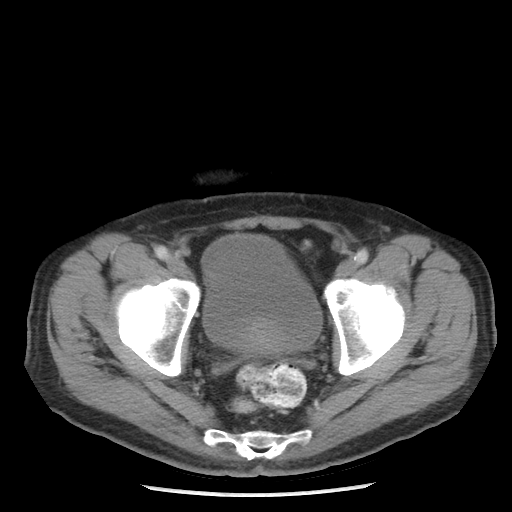
[im 27/81  soft-tissue]
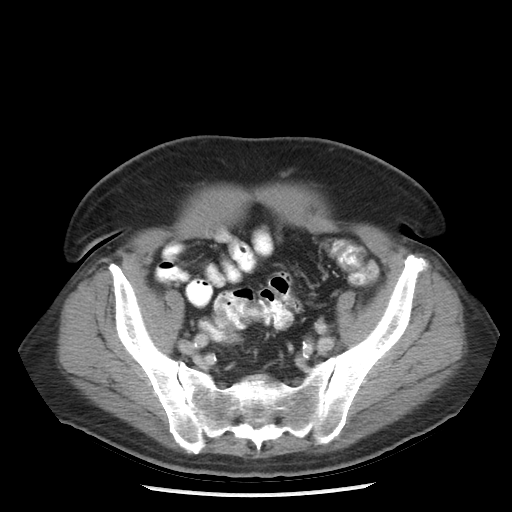
[im 36/81  soft-tissue]
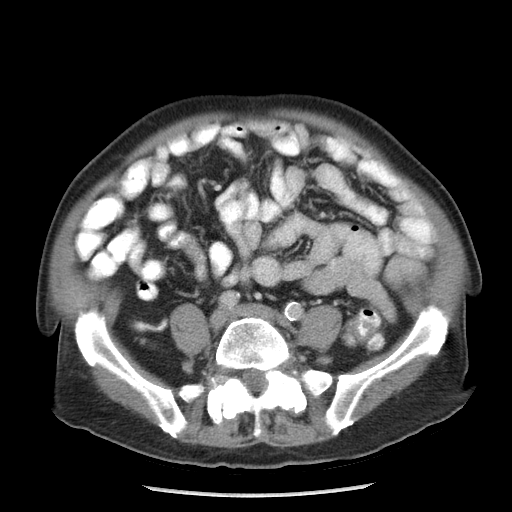
[im 45/81  soft-tissue]
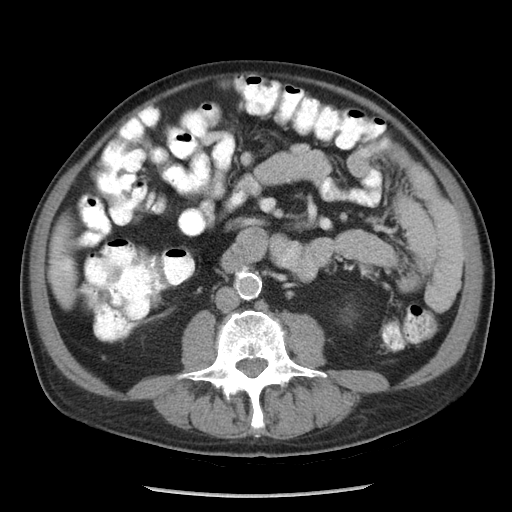
[im 45/81  lung]
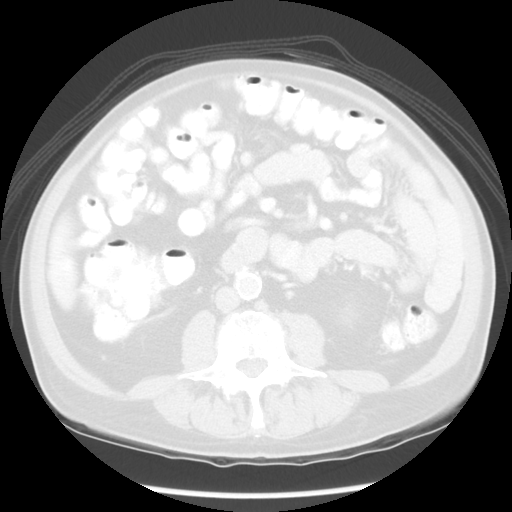
[im 54/81  soft-tissue]
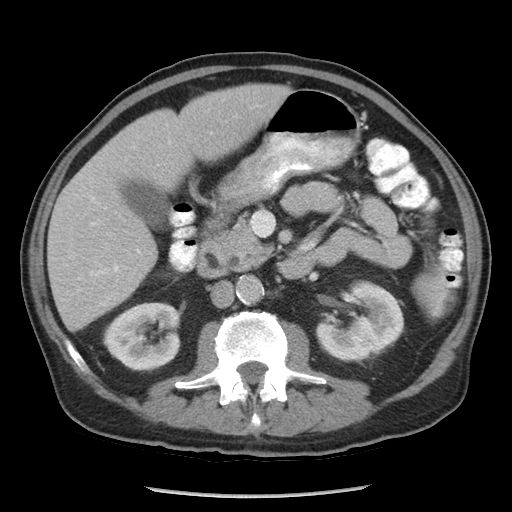
[im 54/81  lung]
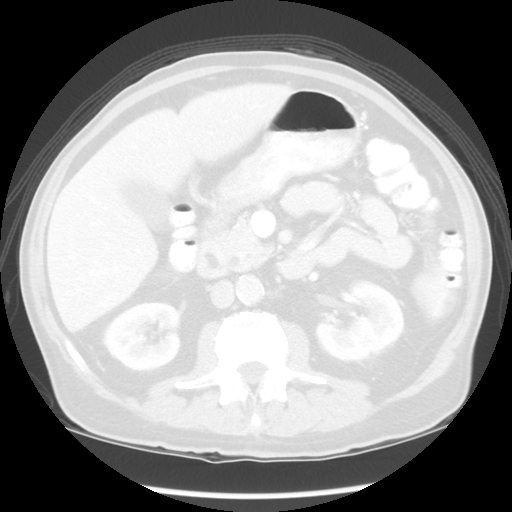
[im 63/81  soft-tissue]
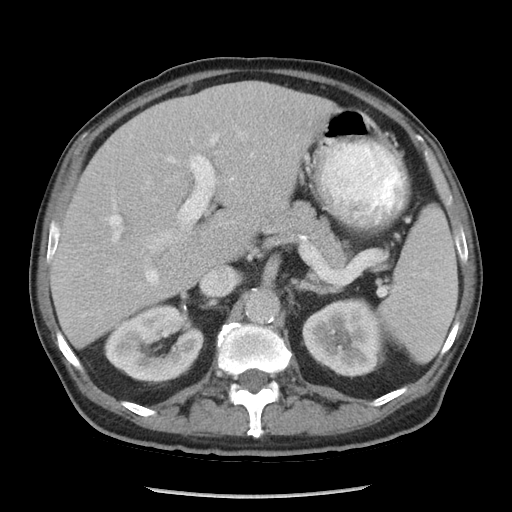
[im 63/81  lung]
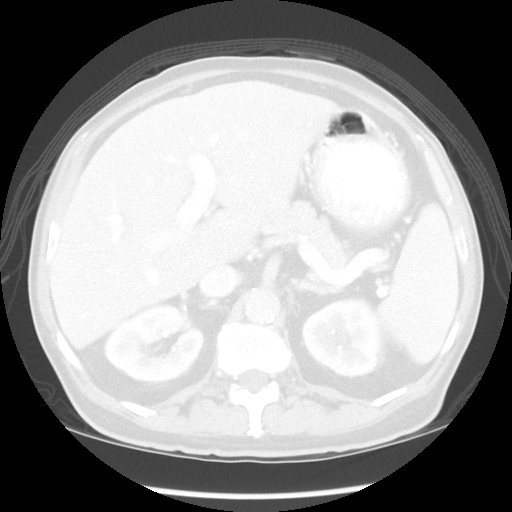
[im 72/81  soft-tissue]
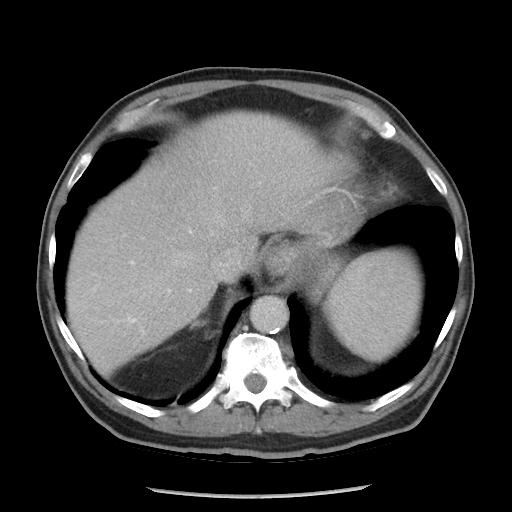
[im 72/81  lung]
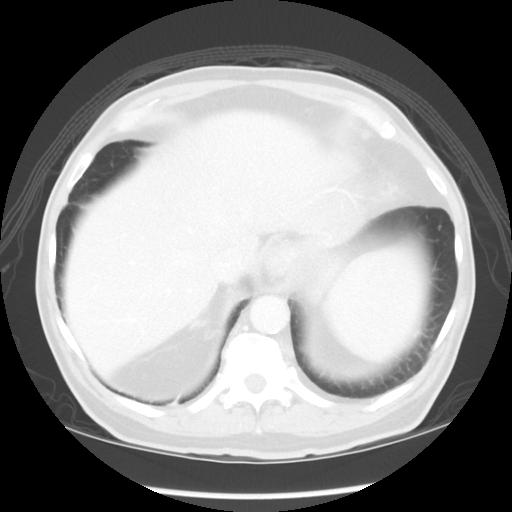

[Series 601: coronal body · coronal · 0.83mm/px · 1 of 123 slices shown, 2 images]
[im 41/123  soft-tissue]
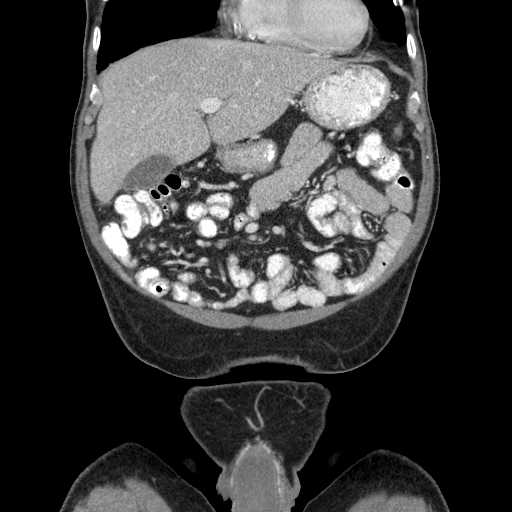
[im 41/123  bone]
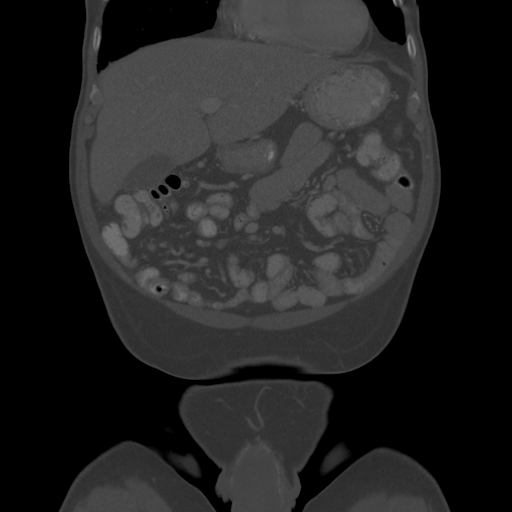

[Series 602: sagittal body · sagittal · 0.83mm/px · 3 of 140 slices shown]
[im 9/140  soft-tissue]
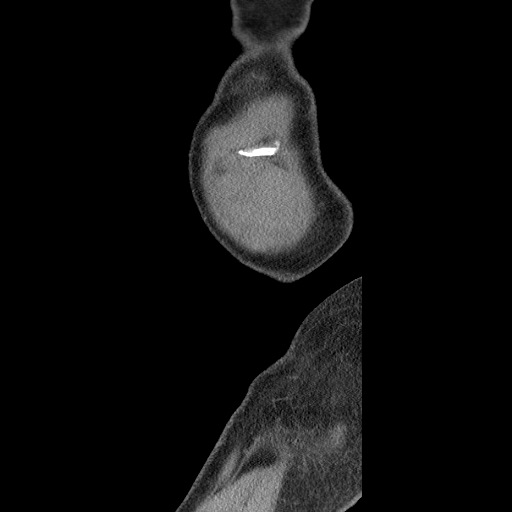
[im 27/140  soft-tissue]
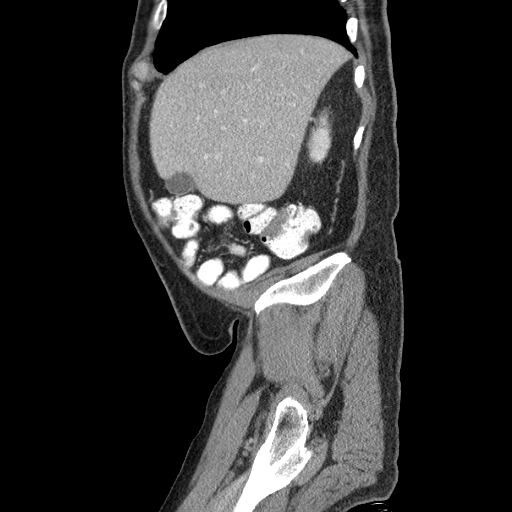
[im 44/140  soft-tissue]
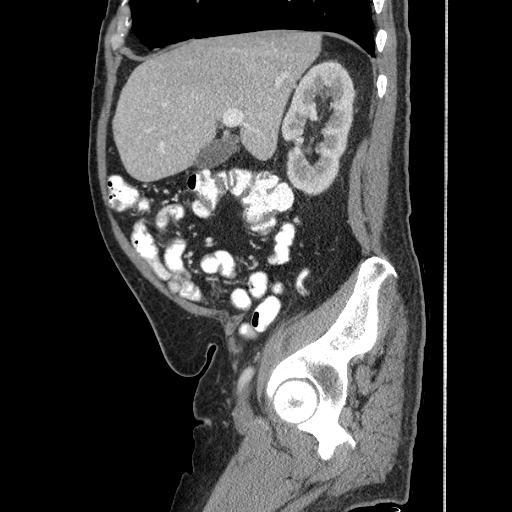

[12 of 36 positions shown; findings below may reference images not displayed]

FINDINGS: Lower chest and abdominal wall: Small umbilical hernia containing
nonobstructed or thickened small bowel. Small low-density anterior
pericardial fluid. Extensive coronary atherosclerotic calcifications
seen in the right circulation.

Hepatobiliary: No focal liver abnormality.No evidence of biliary
obstruction or stone.

Pancreas: Unremarkable.

Spleen: Unremarkable.

Adrenals/Urinary Tract: Negative adrenals. No hydronephrosis or
stone. 19 mm left renal cyst. Unremarkable bladder.

Stomach/Bowel: No obstruction. Few colonic diverticula. Normal
appendix. Incidental 1 cm fatty structure in the mid duodenum which
could be submucosal lipoma or ingested material. Low-density at the
proximal stomach appears intraluminal.

Reproductive:Symmetric enlargement of the prostate up to 54 mm.

Vascular/Lymphatic: No acute vascular abnormality. Diffuse
atherosclerotic calcification of the aorta and branch vessels. Mild
ectasia of the infrarenal aorta up to 25 mm. No mass or adenopathy.

Other: No ascites or pneumoperitoneum.

Musculoskeletal: No acute abnormalities. Lower lumbar facet
arthropathy. Probable gas containing right synovial cyst at L4-5
IMPRESSION: 1. No acute finding or explanation symptoms. Negative for
adenopathy.
2. Umbilical hernia containing small bowel.
3.  Aortic Atherosclerosis ([id number])
4. **An incidental finding of potential clinical significance has
been found. Ectatic abdominal aorta at risk for aneurysm
development. Recommend followup by ultrasound in 5 years. This
recommendation follows ACR consensus guidelines: White Paper of the
ACR Incidental Findings Committee II on Vascular Findings. [HOSPITAL] 4414; [DATE].**
